# Patient Record
Sex: Male | Born: 1960
Health system: Southern US, Community
[De-identification: ages and names within clinical notes are randomized; demographics above are authoritative.]

## PROBLEM LIST (undated history)

## (undated) DIAGNOSIS — I700 Atherosclerosis of aorta: Secondary | ICD-10-CM

## (undated) DIAGNOSIS — M1990 Unspecified osteoarthritis, unspecified site: Secondary | ICD-10-CM

## (undated) DIAGNOSIS — R0600 Dyspnea, unspecified: Secondary | ICD-10-CM

## (undated) DIAGNOSIS — E785 Hyperlipidemia, unspecified: Secondary | ICD-10-CM

## (undated) DIAGNOSIS — N189 Chronic kidney disease, unspecified: Secondary | ICD-10-CM

## (undated) DIAGNOSIS — I10 Essential (primary) hypertension: Secondary | ICD-10-CM

## (undated) DIAGNOSIS — N289 Disorder of kidney and ureter, unspecified: Secondary | ICD-10-CM

## (undated) DIAGNOSIS — K219 Gastro-esophageal reflux disease without esophagitis: Secondary | ICD-10-CM

---

## 2014-11-26 ENCOUNTER — Other Ambulatory Visit (HOSPITAL_COMMUNITY)
Admission: RE | Admit: 2014-11-26 | Discharge: 2014-11-26 | Disposition: A | Discharge status: Home / Self Care | Payer: BLUE CROSS/BLUE SHIELD | Source: Ambulatory Visit | Attending: Preventative Medicine | Admitting: Preventative Medicine

## 2014-11-26 DIAGNOSIS — E119 Type 2 diabetes mellitus without complications: Secondary | ICD-10-CM | POA: Diagnosis present

## 2014-11-27 LAB — HEMOGLOBIN A1C
Hgb A1c MFr Bld: 5.5 % (ref 4.8–5.6)
Mean Plasma Glucose: 111 mg/dL

## 2016-01-24 ENCOUNTER — Encounter: Payer: Self-pay | Admitting: Emergency Medicine

## 2016-01-24 ENCOUNTER — Emergency Department: Payer: Managed Care, Other (non HMO)

## 2016-01-24 ENCOUNTER — Emergency Department
Admission: EM | Admit: 2016-01-24 | Discharge: 2016-01-24 | Disposition: A | Discharge status: Home / Self Care | Payer: Managed Care, Other (non HMO) | Attending: Emergency Medicine | Admitting: Emergency Medicine

## 2016-01-24 DIAGNOSIS — M76892 Other specified enthesopathies of left lower limb, excluding foot: Secondary | ICD-10-CM | POA: Diagnosis not present

## 2016-01-24 DIAGNOSIS — M25562 Pain in left knee: Secondary | ICD-10-CM | POA: Diagnosis present

## 2016-01-24 DIAGNOSIS — I10 Essential (primary) hypertension: Secondary | ICD-10-CM | POA: Insufficient documentation

## 2016-01-24 DIAGNOSIS — Y999 Unspecified external cause status: Secondary | ICD-10-CM | POA: Insufficient documentation

## 2016-01-24 DIAGNOSIS — Y92410 Unspecified street and highway as the place of occurrence of the external cause: Secondary | ICD-10-CM | POA: Diagnosis not present

## 2016-01-24 DIAGNOSIS — M25462 Effusion, left knee: Secondary | ICD-10-CM | POA: Insufficient documentation

## 2016-01-24 DIAGNOSIS — Y9389 Activity, other specified: Secondary | ICD-10-CM | POA: Diagnosis not present

## 2016-01-24 DIAGNOSIS — Z79899 Other long term (current) drug therapy: Secondary | ICD-10-CM | POA: Diagnosis not present

## 2016-01-24 DIAGNOSIS — F17200 Nicotine dependence, unspecified, uncomplicated: Secondary | ICD-10-CM | POA: Diagnosis not present

## 2016-01-24 HISTORY — DX: Essential (primary) hypertension: I10

## 2016-01-24 MED ORDER — NAPROXEN 500 MG PO TABS: 500.0000 mg | ORAL_TABLET | Freq: Two times a day (BID) | ORAL | Status: DC

## 2016-01-24 MED ORDER — OXYCODONE-ACETAMINOPHEN 5-325 MG PO TABS
1.0000 | ORAL_TABLET | Freq: Once | ORAL | Status: AC
  Administered 2016-01-24: 1 via ORAL
  Filled 2016-01-24: qty 1

## 2016-01-24 MED ORDER — NAPROXEN 500 MG PO TABS
500.0000 mg | ORAL_TABLET | Freq: Once | ORAL | Status: AC
  Administered 2016-01-24: 500 mg via ORAL
  Filled 2016-01-24: qty 1

## 2016-01-24 MED ORDER — TRAMADOL HCL 50 MG PO TABS: 50.0000 mg | ORAL_TABLET | Freq: Four times a day (QID) | ORAL | 0 refills | Status: AC | PRN

## 2016-01-24 NOTE — ED Triage Notes (Signed)
Pt to ed with c/o left knee pain,  Pt denies injury.  Reports swelling and pain worse with standing and unable to bend it.

## 2016-01-24 NOTE — Discharge Instructions (Signed)
Advised over-the-counter elastic knee support.

## 2016-01-24 NOTE — ED Notes (Signed)
See triage note   Developed left knee pain yesterday w/o injury  States pain is at the top of knee  Tender to touch  And positive swelling this am  Unable to bear full wt  Ambulates with limp

## 2016-01-24 NOTE — ED Provider Notes (Signed)
[facility] Emergency Department Provider Note   ____________________________________________   None    (approximate)  I have reviewed the triage vital signs and the nursing notes.   HISTORY  Chief Complaint Knee Pain    HPI Geoffrey Rangel is a 55 y.o. male patient complaining of 2 days of pain and edema to the left knee. Patient denies any provocative incident for this complaint. 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 Patient has applied heat and cold to the area with no relief. Patient rates his pain as a 10 over 10.Patient stated pain increases with standing and is unable to flex the knee without pain. Patient described a pain as sharp.   Past Medical History:  Diagnosis Date  . Hypertension     There are no active problems to display for this patient.   History reviewed. No pertinent surgical history.  Prior to Admission medications   Medication Sig Start Date End Date Taking? Authorizing Provider  amLODipine (NORVASC) 5 MG tablet Take 5 mg by mouth daily.   Yes Historical Provider, MD  lisinopril (PRINIVIL,ZESTRIL) 10 MG tablet Take 10 mg by mouth daily.   Yes Historical Provider, MD  naproxen (NAPROSYN) 500 MG tablet Take 1 tablet (500 mg total) by mouth 2 (two) times daily with a meal. 01/24/16   Sable Feil, PA-C  traMADol (ULTRAM) 50 MG tablet Take 1 tablet (50 mg total) by mouth every 6 (six) hours as needed. 01/24/16 01/23/17  Sable Feil, PA-C    Allergies Review of patient's allergies indicates no known allergies.  History reviewed. No pertinent family history.  Social History Social History  Substance Use Topics  . Smoking status: Current Every Day Smoker  . Smokeless tobacco: Never Used  . Alcohol use No    Review of Systems Constitutional: No fever/chills Eyes: No visual changes. ENT: No sore  throat. Cardiovascular: Denies chest pain. Respiratory: Denies shortness of breath. Gastrointestinal: No abdominal pain.  No nausea, no vomiting.  No diarrhea.  No constipation. Genitourinary: Negative for dysuria. Musculoskeletal: Negative for back pain. Skin: Negative for rash. Neurological: Negative for headaches, focal weakness or numbness. Endocrine:Hypertension   ____________________________________________   PHYSICAL EXAM:  VITAL SIGNS: ED Triage Vitals  Enc Vitals Group     BP 01/24/16 0740 (!) 147/88     Pulse Rate 01/24/16 0740 95     Resp 01/24/16 0740 (!) 96     Temp 01/24/16 0740 98.2 F (36.8 C)     Temp Source 01/24/16 0740 Oral     SpO2 01/24/16 0740 100 %     Weight 01/24/16 0741 194 lb (88 kg)     Height 01/24/16 0741 6' (1.829 m)     Head Circumference --      Peak Flow --      Pain Score 01/24/16 0741 10     Pain Loc --      Pain Edu? --      Excl. in [place]? --     Constitutional: Alert and oriented. Well appearing and in no acute distress. Eyes: Conjunctivae are normal. PERRL. EOMI. Head: Atraumatic. Nose: No congestion/rhinnorhea. Mouth/Throat: Mucous membranes are moist.  Oropharynx non-erythematous. Neck: No stridor.  No cervical spine tenderness to palpation. Hematological/Lymphatic/Immunilogical: No cervical lymphadenopathy. Cardiovascular: Normal rate, regular rhythm. Grossly normal heart sounds.  Good peripheral circulation. Respiratory: Normal respiratory effort.  No  retractions. Lungs CTAB. Gastrointestinal: Soft and nontender. No distention. No abdominal bruits. No CVA tenderness. Musculoskeletal: No obvious deformity to the left knee. Patient has some suprapatellar edema. Patient has moderate guarding palpation Center. aspect of the patella. Patient has decreased range of motion with flexion of the knee.  Neurologic:  Normal speech and language. No gross focal neurologic deficits are appreciated. No gait instability. Skin:  Skin is warm,  dry and intact. No rash noted. Psychiatric: Mood and affect are normal. Speech and behavior are normal.  ____________________________________________   LABS (all labs ordered are listed, but only abnormal results are displayed)  Labs Reviewed - No data to display ____________________________________________  EKG   ____________________________________________  RADIOLOGY  No acute findings. Patient has some mild effusion. Patient has a small Baker's cyst popliteal area of the left leg. ____________________________________________   PROCEDURES  Procedure(s) performed: None  Procedures  Critical Care performed: No  ____________________________________________   INITIAL IMPRESSION / ASSESSMENT AND PLAN / ED COURSE  Pertinent labs & imaging results that were available during my care of the patient were reviewed by me and considered in my medical decision making (see chart for details).  Suprapatellar tendinitis with mild effusion. Discussed x-ray findings with patient. Patient given discharge care instructions. Patient get a prescription for naproxen and tramadol. Patient advised to follow-up with family doctor condition persists.  Clinical Course     ____________________________________________   FINAL CLINICAL IMPRESSION(S) / ED DIAGNOSES  Final diagnoses:  Effusion of left knee  Tendinitis of left knee      NEW MEDICATIONS STARTED DURING THIS VISIT:  New Prescriptions   NAPROXEN (NAPROSYN) 500 MG TABLET    Take 1 tablet (500 mg total) by mouth 2 (two) times daily with a meal.   TRAMADOL (ULTRAM) 50 MG TABLET    Take 1 tablet (50 mg total) by mouth every 6 (six) hours as needed.     Note:  This document was prepared using Dragon voice recognition software and may include unintentional dictation errors.    Sable Feil, PA-C 01/24/16 4627    Rudene Re, MD 01/24/16 1150

## 2016-04-09 ENCOUNTER — Encounter (INDEPENDENT_AMBULATORY_CARE_PROVIDER_SITE_OTHER): Payer: Self-pay | Admitting: Vascular Surgery

## 2016-06-01 ENCOUNTER — Other Ambulatory Visit
Admission: RE | Admit: 2016-06-01 | Discharge: 2016-06-01 | Disposition: A | Discharge status: Home / Self Care | Payer: 59 | Source: Ambulatory Visit | Attending: Nephrology | Admitting: Nephrology

## 2016-06-01 DIAGNOSIS — R634 Abnormal weight loss: Secondary | ICD-10-CM | POA: Diagnosis not present

## 2016-06-01 DIAGNOSIS — N184 Chronic kidney disease, stage 4 (severe): Secondary | ICD-10-CM | POA: Insufficient documentation

## 2016-06-01 DIAGNOSIS — R809 Proteinuria, unspecified: Secondary | ICD-10-CM | POA: Diagnosis not present

## 2016-06-01 DIAGNOSIS — I129 Hypertensive chronic kidney disease with stage 1 through stage 4 chronic kidney disease, or unspecified chronic kidney disease: Secondary | ICD-10-CM | POA: Insufficient documentation

## 2016-06-01 LAB — BASIC METABOLIC PANEL
Anion gap: 11 (ref 5–15)
BUN: 61 mg/dL — ABNORMAL HIGH (ref 6–20)
CHLORIDE: 105 mmol/L (ref 101–111)
CO2: 21 mmol/L — ABNORMAL LOW (ref 22–32)
Calcium: 8.9 mg/dL (ref 8.9–10.3)
Creatinine, Ser: 3.76 mg/dL — ABNORMAL HIGH (ref 0.61–1.24)
GFR calc non Af Amer: 17 mL/min — ABNORMAL LOW (ref >60)
GFR, EST AFRICAN AMERICAN: 20 mL/min — AB (ref >60)
Glucose, Bld: 108 mg/dL — ABNORMAL HIGH (ref 65–99)
POTASSIUM: 4.5 mmol/L (ref 3.5–5.1)
SODIUM: 137 mmol/L (ref 135–145)

## 2016-08-25 ENCOUNTER — Ambulatory Visit: Payer: BLUE CROSS/BLUE SHIELD | Admitting: Gastroenterology

## 2016-09-01 ENCOUNTER — Other Ambulatory Visit
Admission: RE | Admit: 2016-09-01 | Discharge: 2016-09-01 | Disposition: A | Discharge status: Home / Self Care | Payer: 59 | Source: Ambulatory Visit | Attending: Nephrology | Admitting: Nephrology

## 2016-09-01 DIAGNOSIS — N184 Chronic kidney disease, stage 4 (severe): Secondary | ICD-10-CM | POA: Diagnosis present

## 2016-09-01 LAB — RENAL FUNCTION PANEL
ALBUMIN: 4.2 g/dL (ref 3.5–5.0)
ANION GAP: 10 (ref 5–15)
BUN: 75 mg/dL — ABNORMAL HIGH (ref 6–20)
CALCIUM: 9.1 mg/dL (ref 8.9–10.3)
CO2: 20 mmol/L — ABNORMAL LOW (ref 22–32)
Chloride: 108 mmol/L (ref 101–111)
Creatinine, Ser: 3.5 mg/dL — ABNORMAL HIGH (ref 0.61–1.24)
GFR calc Af Amer: 21 mL/min — ABNORMAL LOW (ref >60)
GFR calc non Af Amer: 18 mL/min — ABNORMAL LOW (ref >60)
GLUCOSE: 100 mg/dL — AB (ref 65–99)
PHOSPHORUS: 4.8 mg/dL — AB (ref 2.5–4.6)
Potassium: 5.3 mmol/L — ABNORMAL HIGH (ref 3.5–5.1)
SODIUM: 138 mmol/L (ref 135–145)

## 2016-09-06 ENCOUNTER — Other Ambulatory Visit: Payer: Self-pay | Admitting: Nephrology

## 2016-09-06 DIAGNOSIS — R809 Proteinuria, unspecified: Secondary | ICD-10-CM

## 2016-09-15 ENCOUNTER — Encounter
Admission: RE | Admit: 2016-09-15 | Discharge: 2016-09-15 | Disposition: A | Discharge status: Home / Self Care | Payer: 59 | Source: Ambulatory Visit | Attending: Nephrology | Admitting: Nephrology

## 2016-09-15 DIAGNOSIS — I129 Hypertensive chronic kidney disease with stage 1 through stage 4 chronic kidney disease, or unspecified chronic kidney disease: Secondary | ICD-10-CM | POA: Diagnosis not present

## 2016-09-15 DIAGNOSIS — N184 Chronic kidney disease, stage 4 (severe): Secondary | ICD-10-CM | POA: Diagnosis present

## 2016-09-15 DIAGNOSIS — N2889 Other specified disorders of kidney and ureter: Secondary | ICD-10-CM | POA: Diagnosis not present

## 2016-09-15 DIAGNOSIS — R809 Proteinuria, unspecified: Secondary | ICD-10-CM | POA: Diagnosis not present

## 2016-09-15 DIAGNOSIS — E1122 Type 2 diabetes mellitus with diabetic chronic kidney disease: Secondary | ICD-10-CM | POA: Diagnosis not present

## 2016-09-15 DIAGNOSIS — Z791 Long term (current) use of non-steroidal anti-inflammatories (NSAID): Secondary | ICD-10-CM | POA: Diagnosis not present

## 2016-09-15 LAB — DIFFERENTIAL
BASOS PCT: 1 %
Basophils Absolute: 0.1 10*3/uL (ref 0–0.1)
EOS PCT: 3 %
Eosinophils Absolute: 0.2 10*3/uL (ref 0–0.7)
Lymphocytes Relative: 42 %
Lymphs Abs: 3.7 10*3/uL — ABNORMAL HIGH (ref 1.0–3.6)
MONO ABS: 0.6 10*3/uL (ref 0.2–1.0)
Monocytes Relative: 7 %
Neutro Abs: 4.2 10*3/uL (ref 1.4–6.5)
Neutrophils Relative %: 47 %

## 2016-09-15 LAB — URINALYSIS, ROUTINE W REFLEX MICROSCOPIC
BILIRUBIN URINE: NEGATIVE
Glucose, UA: NEGATIVE mg/dL
Ketones, ur: NEGATIVE mg/dL
Leukocytes, UA: NEGATIVE
Nitrite: NEGATIVE
Protein, ur: 100 mg/dL — AB
SPECIFIC GRAVITY, URINE: 1.015 (ref 1.005–1.030)
pH: 5 (ref 5.0–8.0)

## 2016-09-15 LAB — CBC
HCT: 39.4 % — ABNORMAL LOW (ref 40.0–52.0)
Hemoglobin: 13.4 g/dL (ref 13.0–18.0)
MCH: 29.9 pg (ref 26.0–34.0)
MCHC: 34.1 g/dL (ref 32.0–36.0)
MCV: 87.8 fL (ref 80.0–100.0)
PLATELETS: 223 10*3/uL (ref 150–440)
RBC: 4.49 MIL/uL (ref 4.40–5.90)
RDW: 13.8 % (ref 11.5–14.5)
WBC: 8.8 10*3/uL (ref 3.8–10.6)

## 2016-09-15 LAB — PROTEIN / CREATININE RATIO, URINE
CREATININE, URINE: 78 mg/dL
Protein Creatinine Ratio: 0.78 mg/mg{Cre} — ABNORMAL HIGH (ref 0.00–0.15)
Total Protein, Urine: 61 mg/dL

## 2016-09-15 LAB — COMPREHENSIVE METABOLIC PANEL
ALBUMIN: 4 g/dL (ref 3.5–5.0)
ALK PHOS: 96 U/L (ref 38–126)
ALT: 15 U/L — ABNORMAL LOW (ref 17–63)
AST: 16 U/L (ref 15–41)
Anion gap: 9 (ref 5–15)
BILIRUBIN TOTAL: 0.6 mg/dL (ref 0.3–1.2)
BUN: 76 mg/dL — AB (ref 6–20)
CALCIUM: 9.1 mg/dL (ref 8.9–10.3)
CO2: 17 mmol/L — ABNORMAL LOW (ref 22–32)
Chloride: 109 mmol/L (ref 101–111)
Creatinine, Ser: 3.28 mg/dL — ABNORMAL HIGH (ref 0.61–1.24)
GFR calc Af Amer: 23 mL/min — ABNORMAL LOW (ref >60)
GFR calc non Af Amer: 20 mL/min — ABNORMAL LOW (ref >60)
GLUCOSE: 98 mg/dL (ref 65–99)
Potassium: 4.6 mmol/L (ref 3.5–5.1)
Sodium: 135 mmol/L (ref 135–145)
TOTAL PROTEIN: 7.5 g/dL (ref 6.5–8.1)

## 2016-09-15 LAB — URINALYSIS, MICROSCOPIC (REFLEX)
Bacteria, UA: NONE SEEN
Squamous Epithelial / LPF: NONE SEEN
WBC UA: NONE SEEN WBC/hpf (ref 0–5)

## 2016-09-15 LAB — TYPE AND SCREEN
ABO/RH(D): O POS
Antibody Screen: NEGATIVE

## 2016-09-15 LAB — PROTIME-INR
INR: 1.06
Prothrombin Time: 13.8 seconds (ref 11.4–15.2)

## 2016-09-17 ENCOUNTER — Other Ambulatory Visit: Payer: Self-pay

## 2016-09-17 ENCOUNTER — Ambulatory Visit: Payer: Managed Care, Other (non HMO)

## 2016-09-17 ENCOUNTER — Encounter: Payer: Self-pay | Admitting: *Deleted

## 2016-09-17 ENCOUNTER — Other Ambulatory Visit: Payer: Managed Care, Other (non HMO)

## 2016-09-17 ENCOUNTER — Observation Stay: Payer: 59

## 2016-09-17 ENCOUNTER — Observation Stay
Admission: RE | Admit: 2016-09-17 | Discharge: 2016-09-18 | Disposition: A | Discharge status: Home / Self Care | Payer: 59 | Source: Ambulatory Visit | Attending: Nephrology | Admitting: Nephrology

## 2016-09-17 DIAGNOSIS — I129 Hypertensive chronic kidney disease with stage 1 through stage 4 chronic kidney disease, or unspecified chronic kidney disease: Principal | ICD-10-CM | POA: Insufficient documentation

## 2016-09-17 DIAGNOSIS — E1122 Type 2 diabetes mellitus with diabetic chronic kidney disease: Secondary | ICD-10-CM | POA: Insufficient documentation

## 2016-09-17 DIAGNOSIS — N2889 Other specified disorders of kidney and ureter: Secondary | ICD-10-CM | POA: Insufficient documentation

## 2016-09-17 DIAGNOSIS — R809 Proteinuria, unspecified: Secondary | ICD-10-CM | POA: Diagnosis present

## 2016-09-17 DIAGNOSIS — N184 Chronic kidney disease, stage 4 (severe): Secondary | ICD-10-CM | POA: Insufficient documentation

## 2016-09-17 DIAGNOSIS — Z791 Long term (current) use of non-steroidal anti-inflammatories (NSAID): Secondary | ICD-10-CM | POA: Insufficient documentation

## 2016-09-17 HISTORY — PX: RENAL BIOPSY, PERCUTANEOUS: SUR144

## 2016-09-17 LAB — CBC
HCT: 39.7 % — ABNORMAL LOW (ref 40.0–52.0)
HEMOGLOBIN: 13.5 g/dL (ref 13.0–18.0)
MCH: 30.2 pg (ref 26.0–34.0)
MCHC: 34.1 g/dL (ref 32.0–36.0)
MCV: 88.7 fL (ref 80.0–100.0)
Platelets: 217 10*3/uL (ref 150–440)
RBC: 4.47 MIL/uL (ref 4.40–5.90)
RDW: 13.9 % (ref 11.5–14.5)
WBC: 8.6 10*3/uL (ref 3.8–10.6)

## 2016-09-17 MED ORDER — NICOTINE 21 MG/24HR TD PT24
21.0000 mg | MEDICATED_PATCH | Freq: Every day | TRANSDERMAL | Status: DC
  Administered 2016-09-17: 21 mg via TRANSDERMAL
  Filled 2016-09-17: qty 1

## 2016-09-17 MED ORDER — OXYCODONE HCL 5 MG PO TABS: 5.0000 mg | ORAL_TABLET | Freq: Four times a day (QID) | ORAL | Status: DC | PRN

## 2016-09-17 MED ORDER — SODIUM CHLORIDE 0.9 % IV SOLN
INTRAVENOUS | Status: DC
  Administered 2016-09-17: 09:00:00 via INTRAVENOUS

## 2016-09-17 MED ORDER — LORAZEPAM 0.5 MG PO TABS
0.5000 mg | ORAL_TABLET | Freq: Once | ORAL | Status: AC
  Administered 2016-09-17: 0.5 mg via ORAL
  Filled 2016-09-17: qty 1

## 2016-09-17 MED ORDER — ACETAMINOPHEN 325 MG PO TABS
650.0000 mg | ORAL_TABLET | Freq: Four times a day (QID) | ORAL | Status: DC | PRN
  Administered 2016-09-17: 650 mg via ORAL
  Filled 2016-09-17: qty 2

## 2016-09-17 MED ORDER — ZOLPIDEM TARTRATE 5 MG PO TABS
5.0000 mg | ORAL_TABLET | Freq: Once | ORAL | Status: AC
  Administered 2016-09-17: 5 mg via ORAL
  Filled 2016-09-17: qty 1

## 2016-09-17 NOTE — Progress Notes (Signed)
Dr. Candiss Norse notified of admission. Acknowledged. Gesselle Fitzsimons K, RN7:09 AM 09/17/2016

## 2016-09-17 NOTE — Procedures (Signed)
PROCEDURE: Informed written consent was obtained from the patient after a discussion of the risks, benefits and alternatives to treatment. The patient understands and consents the procedure. A timeout was performed prior to the initiation of the procedure.  Ultrasound scanning was performed of the bilateral flanks. The inferior pole of the LEFT kidney was selected for biopsy due to location and sonographic window. The procedure was planned. The operative site was prepped and draped in the usual sterile fashion. The overlying soft tissues were anesthetized with 10 mL of 1% XYLOCAINE.  A 18 gauge core needle biopsy device was advanced into the inferior cortex of the LEFT kidney and 3 core biopsies were obtained under direct ultrasound guidance. Real time pathologic review confirmed adequate tissue acquisition. Images were saved for documentation purposes. The biopsy device was removed and hemostasis was obtained with manual compression. Post procedural scanning was negative for significant post procedural hemorrhage or additional complication. A dressing was placed. The patient tolerated the procedure well without immediate post procedural complication.  

## 2016-09-17 NOTE — H&P (Signed)
[facility], [place] 09/17/16  Subjective:   Patient is known to our practice from outpatient. He is followed for CKD and proteinuria He is admitted for kidney biopsy Patient denies any acute c/o No fevers, chills, no N/V No leg edema. No SOB   Objective:  Vital signs in last 24 hours:  Temp:  [98.1 F (36.7 C)] 98.1 F (36.7 C) (06/22 0650) Pulse Rate:  [68] 68 (06/22 0650) BP: (131)/(65) 131/65 (06/22 0650) SpO2:  [100 %] 100 % (06/22 0650) Weight:  [77.9 kg (171 lb 11.2 oz)] 77.9 kg (171 lb 11.2 oz) (06/22 0845)  Weight change:  Filed Weights   09/17/16 0845  Weight: 77.9 kg (171 lb 11.2 oz)    Intake/Output:    Intake/Output Summary (Last 24 hours) at 09/17/16 0914 Last data filed at 09/17/16 0700  Gross per 24 hour  Intake                0 ml  Output                0 ml  Net                0 ml     Physical Exam: General: Laying in bed, NAD  HEENT Anicteric, moist oral mucus membranes  Neck supple  Pulm/lungs Clear b/l  CVS/Heart Regular, no rub  Abdomen:  Soft, NT  Extremities: No edema  Neurologic: Alert, oriented  Skin: No acute rashes          Basic Metabolic Panel:   Recent Labs Lab 09/15/16 1332  NA 135  K 4.6  CL 109  CO2 17*  GLUCOSE 98  BUN 76*  CREATININE 3.28*  CALCIUM 9.1     CBC:  Recent Labs Lab 09/15/16 1332  WBC 8.8  NEUTROABS 4.2  HGB 13.4  HCT 39.4*  MCV 87.8  PLT 223     No results found for: HEPBSAG, HEPBSAB, HEPBIGM    Microbiology:  No results found for this or any previous visit (from the past 240 hour(s)).  Coagulation Studies:  Recent Labs  09/15/16 1332  LABPROT 13.8  INR 1.06    Urinalysis:  Recent Labs  09/15/16 1332  COLORURINE YELLOW  LABSPEC 1.015  PHURINE 5.0  GLUCOSEU NEGATIVE  HGBUR TRACE*  BILIRUBINUR NEGATIVE  KETONESUR NEGATIVE  PROTEINUR 100*  NITRITE NEGATIVE  LEUKOCYTESUR NEGATIVE    Results for Geoffrey Rangel, Geoffrey Rangel (MRN  [id number]) as of 09/17/2016 09:22  Ref. Range 09/15/2016 13:32  Protein Creatinine Ratio Latest Ref Range: 0.00 - 0.15 mg/mgCre 0.78 (H)    Imaging: No results found.   Medications:   . sodium chloride 10 mL/hr at 09/17/16 3428      Assessment/ Plan:  56 y.o. caucasian male with Diet controlled DM-2, HTN, h/o kidney stones, chronic NSAID use, CKD, weight loss  1. CKD st 4 with proteinuria Cause is unclear Serological work up is negative Urine P/C ratio outpatient was 1.1 gm Patient is admitted for kidney biopsy. Risks benefits and alternatives were discussed. Patient has agreed to proceed.  Low dose ativan for anxiety  2. Unexplained weight loss - GI appointment is pending  3. HTN Controlled ACE-i   LOS: 0 [facility] 6/22/20189:14 [place]

## 2016-09-17 NOTE — Progress Notes (Signed)
Spoke with Dr. Candiss Norse via phone about patient request for pain medicine and something to help him sleep. New orders given for tylenol, oxycodone, and ambien. Geoffrey Rangel

## 2016-09-18 DIAGNOSIS — I129 Hypertensive chronic kidney disease with stage 1 through stage 4 chronic kidney disease, or unspecified chronic kidney disease: Secondary | ICD-10-CM | POA: Diagnosis not present

## 2016-09-18 LAB — CBC
HCT: 40.5 % (ref 40.0–52.0)
HEMOGLOBIN: 13.6 g/dL (ref 13.0–18.0)
MCH: 29.7 pg (ref 26.0–34.0)
MCHC: 33.5 g/dL (ref 32.0–36.0)
MCV: 88.7 fL (ref 80.0–100.0)
PLATELETS: 206 10*3/uL (ref 150–440)
RBC: 4.56 MIL/uL (ref 4.40–5.90)
RDW: 13.6 % (ref 11.5–14.5)
WBC: 9.4 10*3/uL (ref 3.8–10.6)

## 2016-09-18 MED ORDER — OXYCODONE HCL 5 MG PO TABS: 5.0000 mg | ORAL_TABLET | Freq: Four times a day (QID) | ORAL | 0 refills | Status: DC | PRN

## 2016-09-18 NOTE — Progress Notes (Signed)
Dr. Candiss Norse rounded and ordered discharge. Discharge instructions and prescription was reviewed with patient and his wife.  Questions were encouraged. Per patient, Dr. Candiss Norse discussed instructions for follow up appointment. Patient and wife both verbalized understanding.

## 2016-09-18 NOTE — Discharge Summary (Signed)
[facility], [place] 09/18/16  Subjective:   Doing well after biopsy Some soreness No blood in urine  Objective:  Vital signs in last 24 hours:  Temp:  [97.4 F (36.3 C)-98.4 F (36.9 C)] 97.5 F (36.4 C) (06/23 0418) Pulse Rate:  [54-101] 54 (06/23 0418) Resp:  [16-18] 16 (06/23 0418) BP: (98-132)/(58-89) 106/66 (06/23 0418) SpO2:  [98 %-100 %] 100 % (06/23 0418)  Weight change:  Filed Weights   09/17/16 0845  Weight: 77.9 kg (171 lb 11.2 oz)    Intake/Output:    Intake/Output Summary (Last 24 hours) at 09/18/16 0924 Last data filed at 09/18/16 0700  Gross per 24 hour  Intake              320 ml  Output             1900 ml  Net            -1580 ml     Physical Exam: General: NAD   HEENT Anicteric, moist mucus membranes  Neck supple  Pulm/lungs clear  CVS/Heart regular  Abdomen:  Soft, NT  Extremities: No edema  Neurologic: No edema  Skin: No rashes          Basic Metabolic Panel:   Recent Labs Lab 09/15/16 1332  NA 135  K 4.6  CL 109  CO2 17*  GLUCOSE 98  BUN 76*  CREATININE 3.28*  CALCIUM 9.1     CBC:  Recent Labs Lab 09/15/16 1332 09/17/16 1719 09/18/16 0347  WBC 8.8 8.6 9.4  NEUTROABS 4.2  --   --   HGB 13.4 13.5 13.6  HCT 39.4* 39.7* 40.5  MCV 87.8 88.7 88.7  PLT 223 217 206     No results found for: HEPBSAG, HEPBSAB, HEPBIGM    Microbiology:  No results found for this or any previous visit (from the past 240 hour(s)).  Coagulation Studies:  Recent Labs  09/15/16 1332  LABPROT 13.8  INR 1.06    Urinalysis:  Recent Labs  09/15/16 1332  COLORURINE YELLOW  LABSPEC 1.015  PHURINE 5.0  GLUCOSEU NEGATIVE  HGBUR TRACE*  BILIRUBINUR NEGATIVE  KETONESUR NEGATIVE  PROTEINUR 100*  NITRITE NEGATIVE  LEUKOCYTESUR NEGATIVE      Imaging: US Biopsy-no Radiologist  Result Date: 09/17/2016 CLINICAL DATA:  Percutaneous renal biopsy EXAM: ULTRASOUND GUIDED CORE NEEDLE BIOPSY OF THE  LEFT KIDNEY COMPARISON:  None FINDINGS: Percutaneous ultrasound-guided core biopsy of the left kidney performed by Dr. Candiss Norse without a radiologist present. Multiple sonographic images from the biopsy are provided for interpretation. Right kidney measures 9.5 cm in length. No hydronephrosis or renal mass. Left kidney measures 10.03 cm in length. No hydronephrosis or renal mass. Three core biopsies were obtained through the lower pole of the left kidney. Post biopsy images of the left kidney demonstrate a small avascular fluid collection adjacent to the left lower pole measuring 2 x 1.4 cm likely reflecting a small perinephric hematoma. IMPRESSION: Percutaneous ultrasound-guided core biopsy of the left kidney performed by Dr. Candiss Norse without a radiologist present. Small perinephric hematoma adjacent to the lower pole of the left kidney measuring 2 x 1.4 cm. Electronically Signed   By: Kathreen Devoid   On: 09/17/2016 11:27     Medications:    . nicotine  21 mg Transdermal Daily   acetaminophen, oxyCODONE  Assessment/ Plan:  56 y.o. male with Diet controlled DM-2, HTN, h/o kidney stones, chronic NSAID use, CKD, weight loss  1. CKD  st 4 with proteinuria Cause is unclear Serological work up is negative Urine P/C ratio outpatient was 1.1 gm Patient is admitted for kidney biopsy. Ready for d/c today  2. Unexplained weight loss - GI appointment is pending  3. HTN Controlled ACE-i   LOS: 0 [facility] 6/23/20189:24 [place], [place]

## 2016-09-18 NOTE — Discharge Instructions (Signed)
Proteinuria Proteinuria is when there is too much protein in the urine. Proteins are important for buildingmuscles and bones. Proteins are also needed to fight infections, help the blood clot, and keep body fluids in balance. Proteinuria may be mild and temporary, or it may be an early sign of kidney disease. The kidneys make urine. Healthy kidneys also keep substances like proteins from leaving the blood and ending up in the urine. Proteinuria may be a sign that the kidneys are not working well. What are the causes? Healthy kidneys have filters (glomeruli) that keep proteins out of the urine. Proteinuria may mean that the glomeruli are damaged. The main causes of this type of damage are:  Diabetes.  High blood pressure.  Other causes of kidney damage can also cause proteinuria, such as:  Diseases of the immune system, such as lupus, rheumatoid arthritis, sarcoidosis, and Goodpasture syndrome.  Heart disease or heart failure.  Kidney infection.  Certain cancers, including kidney cancer, lymphoma, leukemia, and multiple myeloma.  Amyloidosis. This is a disease that causes abnormal proteins to build up in body tissues.  Reactions to certain medicines, such as NSAIDs.  Injury (trauma) or poisons (toxins).  High blood pressure that occurs during pregnancy (preeclampsia).  Temporary proteinuria may result from conditions that put stress on the kidneys. These conditions usually do not cause kidney damage. They include:  Fever.  Exposure to cold or heat.  Emotional or physical stress.  Extreme exercise.  Standing for long periods of time.  What increases the risk? This condition is more likely to develop in people who:  Have diabetes.  Have high blood pressure.  Have heart disease or heart failure.  Have an immune disease, cancer, or other disease that affects the kidneys.  Have a family history of kidney disease.  Are 65 or older.  Are overweight.  Are of  African-American, American [place], Hispanic/Latino, or [place] descent.  Are pregnant.  Have an infection.  What are the signs or symptoms? Mild proteinuria may not cause symptoms. As more proteins enter the urine, symptoms of kidney disease may develop, such as:  Foamy urine.  Swelling of the face, abdomen, hands, legs, or feet (edema).  Needing to urinate frequently.  Fatigue.  Difficulty sleeping.  Dry and itchy skin.  Nausea and vomiting.  Muscle cramps.  Shortness of breath.  How is this diagnosed? Your health care provider can diagnose proteinuria with a urine test. You may have this test as part of a routine physical or because you have symptoms of kidney disease or risk factors for kidney disease. You may also have:  Blood tests to measure the level of a certain substance (creatinine) that increases with kidney disease.  Imaging tests of your kidney, such as a CT scan or an ultrasound, to look for signs of kidney damage.  How is this treated? If your proteinuria is mild or temporary, no treatment may be needed. Your health care provider may show you how to monitor the level of protein in your urine at home. Identifying proteinuria early is important so that the cause of the condition can be treated. Treatment depends on the cause of your proteinuria. Treatment may include:  Making diet and lifestyle changes.  Getting blood pressure under control.  Getting blood sugar under control, if you have diabetes.  Managing any other medical conditions you have that affect your kidneys.  Giving birth, if you are pregnant.  Avoiding medicines that damage your kidneys.  Treating kidney disease with medicine  and dialysis, as needed.  Follow these instructions at home:  Check your protein levels at home, if directed by your health care provider.  Follow instructions from your health care provider about eating or drinking restrictions.  Take over-the-counter  and prescription medicines only as told by your health care provider.  Return to your normal activities as told by your health care provider. Ask your health care provider what activities are safe for you.  If you are overweight, ask your health care provider to help you with a diet to get to a healthy weight.  Ask your health care provider to help you with an exercise program.  Keep all follow-up visits as told by your health care provider. This is important. Contact a health care provider if:  You have new symptoms.  Your symptoms get worse or do not improve. Get help right away if:  You have back pain.  You have diarrhea.  You vomit.  You have a fever.  You have a rash. This information is not intended to replace advice given to you by your health care provider. Make sure you discuss any questions you have with your health care provider. Document Released: 05/05/2005 Document Revised: 04/25/2015 Document Reviewed: 02/03/2015 Elsevier Interactive Patient Education  2018 [place].   Percutaneous Kidney Biopsy A kidney biopsy is a procedure to remove small pieces of tissue from a kidney. In a percutaneous biopsy, the tissue is removed using a needle that is inserted through the skin. This procedure is done so that the tissue can be examined under a microscope and checked for disease or infection. Tell a health care provider about:  Any allergies you have.  All medicines you are taking, including vitamins, herbs, eye drops, creams, and over-the-counter medicines.  Any problems you or family members have had with anesthetic medicines.  Any blood disorders you have.  Any surgeries you have had.  Any medical conditions you have.  Whether you are pregnant or may be pregnant. What are the risks? Generally, this is a safe procedure. However, problems may occur, including:  Infection.  Bleeding.  Allergic reactions to medicines.  Damage to other structures or  organs.  Swelling from a collection of clotted blood outside a blood vessel (hematoma).  Blood in the urine (hematuria).  What happens before the procedure?  Follow instructions from your health care provider about eating or drinking restrictions.  Ask your health care provider about: ? Changing or stopping your regular medicines. This is especially important if you are taking diabetes medicines or blood thinners. ? Taking medicines such as aspirin and ibuprofen. These medicines can thin your blood. Do not take these medicines before your procedure if your health care provider instructs you not to.  You may be given antibiotic medicine to help prevent infection.  You will have blood and urine samples taken. This is to make sure that you do not have a condition where you should not have a biopsy.  Plan to have someone take you home from the hospital or clinic.  Ask your health care provider how your biopsy site will be marked or identified. What happens during the procedure?  To lower your risk of infection: ? Your health care team will wash or sanitize their hands. ? Your skin will be washed with soap.  An IV tube will be inserted into one of your veins.  You will be given one or more of the following: ? A medicine to help you relax (sedative). ? A  medicine to numb the area (local anesthetic).  You will lie on your abdomen. A firm pillow will be placed under your body to help push the kidneys closer to the surface of the skin. If you have a transplanted kidney, you will lie on your back.  The health care provider will mark the area where the needle will enter your skin.  An imaging test--such as an ultrasound, X-ray, CT scan, or MRI--will be used to locate the kidney. These images will also help the health care provider to guide the biopsy needle into the kidney.  You will be asked to hold your breath and stay still while the health care provider inserts the needle and removes  the kidney tissue. ? You will need to hold your breath and stay still for 30-45 seconds. ? During the biopsy, you may hear a popping sound from the needle. ? You may also feel some pressure from the area where the needle is being inserted.  The needle may be inserted and removed 3 or 4 times to make sure that enough tissue is taken for testing.  A bandage (dressing) may be placed over the spot where the needle entered your skin (biopsy site). The procedure may vary among health care providers and hospitals. What happens after the procedure?  Your blood pressure, heart rate, breathing rate, and blood oxygen level will be monitored until the medicines you were given have worn off.  You will need to lie on your back for 6-8 hours.  You may have some pain or soreness near the biopsy site.  You may have pink or cloudy urine from small amounts of blood. This is normal.  You may have grogginess or fatigue if you were given a sedative.  Do not drive for 24 hours if you were given a sedative.  It is up to you to get the results of your procedure. Ask your health care provider, or the department performing the procedure, when your results will be ready. This information is not intended to replace advice given to you by your health care provider. Make sure you discuss any questions you have with your health care provider. Document Released: 01/24/2004 Document Revised: 12/26/2015 Document Reviewed: 12/26/2015 Elsevier Interactive Patient Education  [employer].

## 2016-09-23 LAB — SURGICAL PATHOLOGY

## 2016-09-27 ENCOUNTER — Encounter: Payer: Self-pay | Admitting: Nephrology

## 2016-09-28 ENCOUNTER — Ambulatory Visit: Payer: Managed Care, Other (non HMO) | Admitting: Gastroenterology

## 2016-09-28 ENCOUNTER — Other Ambulatory Visit: Payer: Self-pay

## 2016-09-28 ENCOUNTER — Encounter: Payer: Self-pay | Admitting: Gastroenterology

## 2016-09-28 ENCOUNTER — Other Ambulatory Visit
Admission: RE | Admit: 2016-09-28 | Discharge: 2016-09-28 | Disposition: A | Discharge status: Home / Self Care | Payer: 59 | Source: Ambulatory Visit | Attending: Gastroenterology | Admitting: Gastroenterology

## 2016-09-28 ENCOUNTER — Ambulatory Visit (INDEPENDENT_AMBULATORY_CARE_PROVIDER_SITE_OTHER): Payer: 59 | Admitting: Gastroenterology

## 2016-09-28 VITALS — BP 118/75 | Temp 98.4°F | Ht 74.0 in | Wt 182.0 lb

## 2016-09-28 DIAGNOSIS — R197 Diarrhea, unspecified: Secondary | ICD-10-CM

## 2016-09-28 DIAGNOSIS — R634 Abnormal weight loss: Secondary | ICD-10-CM | POA: Diagnosis present

## 2016-09-28 NOTE — Progress Notes (Signed)
Jonathon Bellows MD, MRCP(U.K) [address]  [place]  [place], [place] [address]  Main: [phone]  Fax: [phone]   Gastroenterology Consultation  Referring Provider:     Dr Candiss Norse  Primary Care Physician:  Patient, No Pcp Per Primary Gastroenterologist:  Dr. Jonathon Bellows  Reason for Consultation:     Weight loss and diarrhea         HPI:   Geoffrey Rangel is a 56 y.o. y/o male referred for weight loss and diarrhea .   He has been referred for weight loss,severe diarrhea . Labs 08/2016 Hb 13.6 . Albumin 4.0 .  He says that he weighed 300 lbs 1 year back and today weighs 182 lbs. " I eat like a pig".   Diarrhea :  Onset: 1 year, not progressed    Number of bowel movements a day : 1 or none    Color : brown   Consistency:  Mashed potatoes , sometimes large in qty and sometimes small Present status: ongoing    Shape of stool:  Yes    Weight loss:  Yes  Prior colonoscopy:  No   Artificial sugars/sodas/chewing gum:  No    Bloating:  No   Gas:  Yes  Antibiotic use: no   Denies any rectal bleeding , no shortness of breath.     Past Medical History:  Diagnosis Date  . Hypertension     Past Surgical History:  Procedure Laterality Date  . RENAL BIOPSY, PERCUTANEOUS  09/17/2016        Prior to Admission medications   Medication Sig Start Date End Date Taking? Authorizing Provider  amLODipine (NORVASC) 5 MG tablet Take 5 mg by mouth daily.    [provider]  lisinopril (PRINIVIL,ZESTRIL) 10 MG tablet Take 10 mg by mouth daily.    [provider]  oxyCODONE (OXY IR/ROXICODONE) 5 MG immediate release tablet Take 1 tablet (5 mg total) by mouth every 6 (six) hours as needed for severe pain. 09/18/16   Murlean Iba, MD  traMADol (ULTRAM) 50 MG tablet Take 1 tablet (50 mg total) by mouth every 6 (six) hours as needed. 01/24/16 01/23/17  Sable Feil, PA-C    No family history on file.   Social History  Substance Use Topics  . Smoking  status: Current Every Day Smoker  . Smokeless tobacco: Never Used  . Alcohol use No    Allergies as of 09/28/2016  . (No Known Allergies)    Review of Systems:    All systems reviewed and negative except where noted in HPI.   Physical Exam:  There were no vitals taken for this visit. No LMP for male patient. Psych:  Alert and cooperative. Normal mood and affect. General:   Alert,  Well-developed, well-nourished, pleasant and cooperative in NAD Head:  Normocephalic and atraumatic. Eyes:  Sclera clear, no icterus.   Conjunctiva pink. Ears:  Normal auditory acuity. Nose:  No deformity, discharge, or lesions. Mouth:  No deformity or lesions,oropharynx pink & moist. Neck:  Supple; no masses or thyromegaly. Lungs:  Respirations even and unlabored.  Clear throughout to auscultation.   No wheezes, crackles, or rhonchi. No acute distress. Heart:  Regular rate and rhythm; no murmurs, clicks, rubs, or gallops. Abdomen:  Normal bowel sounds.  No bruits.  Soft, non-tender and non-distended without masses, hepatosplenomegaly or hernias noted.  No guarding or rebound tenderness.   Lot of loose skin on abdomen  Neurologic:  Alert and oriented x3;  grossly normal neurologically. Skin:  Intact without significant lesions or rashes. No jaundice. Lymph Nodes:  No significant cervical adenopathy. Psych:  Alert and cooperative. Normal mood and affect.  Imaging Studies: US Biopsy-no Radiologist  Result Date: 09/17/2016 CLINICAL DATA:  Percutaneous renal biopsy EXAM: ULTRASOUND GUIDED CORE NEEDLE BIOPSY OF THE LEFT KIDNEY COMPARISON:  None FINDINGS: Percutaneous ultrasound-guided core biopsy of the left kidney performed by Dr. Candiss Norse without a radiologist present. Multiple sonographic images from the biopsy are provided for interpretation. Right kidney measures 9.5 cm in length. No hydronephrosis or renal mass. Left kidney measures 10.03 cm in length. No hydronephrosis or renal mass. Three core biopsies  were obtained through the lower pole of the left kidney. Post biopsy images of the left kidney demonstrate a small avascular fluid collection adjacent to the left lower pole measuring 2 x 1.4 cm likely reflecting a small perinephric hematoma. IMPRESSION: Percutaneous ultrasound-guided core biopsy of the left kidney performed by Dr. Candiss Norse without a radiologist present. Small perinephric hematoma adjacent to the lower pole of the left kidney measuring 2 x 1.4 cm. Electronically Signed   By: Kathreen Devoid   On: 09/17/2016 11:27    Assessment and Plan:   Geoffrey Rangel is a 56 y.o. y/o male has been referred for Weight loss-unintentional(182 lbs weight loss last few months ), diarrhea    Plan  1. Check TSH 2. EGD+colonoscopy  3.  CT chest/abdomen/pelvis. 4. Stool for c diff, pcr, lactoferrin and celiac serology  5. Needs to stop smoking .    I have discussed alternative options, risks & benefits,  which include, but are not limited to, bleeding, infection, perforation,respiratory complication & drug reaction.  The patient agrees with this plan & written consent will be obtained.     Follow up in 8-12 weeks  Dr Jonathon Bellows MD,MRCP(U.K)

## 2016-09-28 NOTE — Patient Instructions (Signed)
You have been scheduled CT of Chest, Abd and Pelvis at [facility] on Monday Julyn 9th arrive at 10:45am.  You have been scheduled for Colonoscopy and Endoscopy at [facility] on 10/13/16 (Wed) Dr. Vicente Males.

## 2016-09-29 ENCOUNTER — Other Ambulatory Visit
Admission: RE | Admit: 2016-09-29 | Discharge: 2016-09-29 | Disposition: A | Discharge status: Home / Self Care | Payer: 59 | Source: Ambulatory Visit | Attending: Gastroenterology | Admitting: Gastroenterology

## 2016-09-29 DIAGNOSIS — R197 Diarrhea, unspecified: Secondary | ICD-10-CM | POA: Insufficient documentation

## 2016-09-29 LAB — GASTROINTESTINAL PANEL BY PCR, STOOL (REPLACES STOOL CULTURE)

## 2016-09-29 LAB — C DIFFICILE QUICK SCREEN W PCR REFLEX
C DIFFICILE (CDIFF) INTERP: NOT DETECTED
C DIFFICILE (CDIFF) TOXIN: NEGATIVE
C Diff antigen: NEGATIVE

## 2016-09-29 LAB — LACTOFERRIN, FECAL, QUALITATIVE: Lactoferrin, Fecal, Qual: NEGATIVE

## 2016-09-30 ENCOUNTER — Other Ambulatory Visit: Payer: Self-pay

## 2016-09-30 DIAGNOSIS — R634 Abnormal weight loss: Secondary | ICD-10-CM

## 2016-10-01 ENCOUNTER — Other Ambulatory Visit: Payer: Self-pay

## 2016-10-01 DIAGNOSIS — R197 Diarrhea, unspecified: Secondary | ICD-10-CM

## 2016-10-01 DIAGNOSIS — R634 Abnormal weight loss: Secondary | ICD-10-CM

## 2016-10-01 LAB — CELIAC DISEASE PANEL
Endomysial Ab, IgA: NEGATIVE
IgA: 200 mg/dL (ref 90–386)
Tissue Transglutaminase Ab, IgA: <2 U/mL (ref 0–3)

## 2016-10-04 ENCOUNTER — Ambulatory Visit
Admission: RE | Admit: 2016-10-04 | Discharge: 2016-10-04 | Disposition: A | Discharge status: Home / Self Care | Payer: Commercial Managed Care - HMO | Source: Ambulatory Visit | Attending: Gastroenterology | Admitting: Gastroenterology

## 2016-10-04 ENCOUNTER — Telehealth: Payer: Self-pay

## 2016-10-04 DIAGNOSIS — R197 Diarrhea, unspecified: Secondary | ICD-10-CM | POA: Diagnosis present

## 2016-10-04 DIAGNOSIS — R634 Abnormal weight loss: Secondary | ICD-10-CM | POA: Diagnosis present

## 2016-10-04 DIAGNOSIS — I700 Atherosclerosis of aorta: Secondary | ICD-10-CM | POA: Insufficient documentation

## 2016-10-04 DIAGNOSIS — K5730 Diverticulosis of large intestine without perforation or abscess without bleeding: Secondary | ICD-10-CM | POA: Diagnosis not present

## 2016-10-04 DIAGNOSIS — I2510 Atherosclerotic heart disease of native coronary artery without angina pectoris: Secondary | ICD-10-CM | POA: Insufficient documentation

## 2016-10-04 NOTE — Telephone Encounter (Signed)
Advised patient of results per Dr. Vicente Males.   Stool tests negative  Celiac serology negative

## 2016-10-04 NOTE — Telephone Encounter (Signed)
-----   Message from Jonathon Bellows, MD sent at 09/30/2016  1:36 PM EDT ----- Stool tests negative

## 2016-10-05 ENCOUNTER — Telehealth: Payer: Self-pay

## 2016-10-05 ENCOUNTER — Telehealth: Payer: Self-pay | Admitting: Gastroenterology

## 2016-10-05 NOTE — Telephone Encounter (Signed)
Patient left a voice message that he is returning your call

## 2016-10-05 NOTE — Telephone Encounter (Signed)
-----   Message from Jonathon Bellows, MD sent at 10/04/2016 12:59 PM EDT ----- Dahlia Byes inform normal CT chest/abdomen/pelvis- no abnormality to explain weight loss

## 2016-10-05 NOTE — Telephone Encounter (Signed)
Left message with family member for callback for results per Dr. Vicente Males.   Geoffrey Rangel inform normal CT chest/abdomen/pelvis- no abnormality to explain weight loss

## 2016-10-07 ENCOUNTER — Telehealth: Payer: Self-pay | Admitting: Gastroenterology

## 2016-10-07 NOTE — Telephone Encounter (Signed)
10/07/16 [employer] website for EGD 43235 & Colonoscopy 45378 - R63.7 & R19.7 Auth #: [id number].

## 2016-10-08 ENCOUNTER — Encounter: Payer: Self-pay | Admitting: Anesthesiology

## 2016-10-13 ENCOUNTER — Ambulatory Visit: Admission: RE | Admit: 2016-10-13 | Payer: 59 | Source: Ambulatory Visit | Admitting: Gastroenterology

## 2016-10-13 HISTORY — DX: Chronic kidney disease, unspecified: N18.9

## 2016-10-13 HISTORY — DX: Dyspnea, unspecified: R06.00

## 2016-10-13 HISTORY — DX: Unspecified osteoarthritis, unspecified site: M19.90

## 2016-10-13 HISTORY — DX: Gastro-esophageal reflux disease without esophagitis: K21.9

## 2016-10-13 SURGERY — COLONOSCOPY WITH PROPOFOL
Anesthesia: Choice

## 2016-12-29 ENCOUNTER — Ambulatory Visit: Payer: 59 | Admitting: Gastroenterology

## 2016-12-30 ENCOUNTER — Encounter: Payer: Self-pay | Admitting: Gastroenterology

## 2016-12-30 ENCOUNTER — Ambulatory Visit: Payer: 59 | Admitting: Gastroenterology

## 2016-12-30 NOTE — Progress Notes (Deleted)
He is here today for a follow up visit for weight loss and diarrhea.   Summary of history :  He was initially seen on 09/28/16 for weight loss and diarrhea. Labs 08/2016 Hb 13.6 . Albumin 4.0 .He says that he weighed 300 lbs 1 year back and dropped to  182 lbs. Good appetite. Diarrhea began a year back , not progressed, consistency of mashed potatoes .    Interval history   09/28/2016-  12/30/2016  10/04/16- CT chest/abdomen and pelvis:No gross abnormalities to explain weight loss.  Labs 09/2016 - Stool for c diff, lactoferrin, PCR was negative. Celiac serology negative.    Geoffrey Rangel is a 56 y.o. y/o male has been referred for Weight loss-unintentional(182 lbs weight loss last few months ), diarrhea    Plan  1.  EGD+colonoscopy  2. Needs to stop smoking .

## 2018-06-08 ENCOUNTER — Observation Stay (HOSPITAL_COMMUNITY)
Admission: EM | Admit: 2018-06-08 | Discharge: 2018-06-10 | Disposition: A | Discharge status: Home / Self Care | Payer: 59 | Attending: Student in an Organized Health Care Education/Training Program | Admitting: Student in an Organized Health Care Education/Training Program

## 2018-06-08 ENCOUNTER — Inpatient Hospital Stay (HOSPITAL_COMMUNITY): Payer: 59

## 2018-06-08 ENCOUNTER — Other Ambulatory Visit (HOSPITAL_COMMUNITY): Payer: Self-pay

## 2018-06-08 ENCOUNTER — Other Ambulatory Visit: Payer: Self-pay

## 2018-06-08 ENCOUNTER — Encounter (HOSPITAL_COMMUNITY): Payer: Self-pay | Admitting: Emergency Medicine

## 2018-06-08 ENCOUNTER — Emergency Department (HOSPITAL_COMMUNITY): Payer: 59

## 2018-06-08 ENCOUNTER — Encounter (HOSPITAL_COMMUNITY)
Admission: EM | Disposition: A | Discharge status: Home / Self Care | Payer: Self-pay | Source: Home / Self Care | Attending: Emergency Medicine

## 2018-06-08 DIAGNOSIS — Z9114 Patient's other noncompliance with medication regimen: Secondary | ICD-10-CM | POA: Insufficient documentation

## 2018-06-08 DIAGNOSIS — R079 Chest pain, unspecified: Secondary | ICD-10-CM | POA: Diagnosis not present

## 2018-06-08 DIAGNOSIS — I129 Hypertensive chronic kidney disease with stage 1 through stage 4 chronic kidney disease, or unspecified chronic kidney disease: Secondary | ICD-10-CM | POA: Diagnosis not present

## 2018-06-08 DIAGNOSIS — E1122 Type 2 diabetes mellitus with diabetic chronic kidney disease: Secondary | ICD-10-CM | POA: Insufficient documentation

## 2018-06-08 DIAGNOSIS — I120 Hypertensive chronic kidney disease with stage 5 chronic kidney disease or end stage renal disease: Secondary | ICD-10-CM | POA: Diagnosis not present

## 2018-06-08 DIAGNOSIS — Z8249 Family history of ischemic heart disease and other diseases of the circulatory system: Secondary | ICD-10-CM | POA: Insufficient documentation

## 2018-06-08 DIAGNOSIS — N184 Chronic kidney disease, stage 4 (severe): Secondary | ICD-10-CM

## 2018-06-08 DIAGNOSIS — N189 Chronic kidney disease, unspecified: Secondary | ICD-10-CM

## 2018-06-08 DIAGNOSIS — N400 Enlarged prostate without lower urinary tract symptoms: Secondary | ICD-10-CM | POA: Insufficient documentation

## 2018-06-08 DIAGNOSIS — N185 Chronic kidney disease, stage 5: Secondary | ICD-10-CM | POA: Diagnosis present

## 2018-06-08 DIAGNOSIS — E872 Acidosis: Secondary | ICD-10-CM | POA: Insufficient documentation

## 2018-06-08 DIAGNOSIS — I200 Unstable angina: Secondary | ICD-10-CM | POA: Insufficient documentation

## 2018-06-08 DIAGNOSIS — E785 Hyperlipidemia, unspecified: Secondary | ICD-10-CM | POA: Diagnosis not present

## 2018-06-08 DIAGNOSIS — Z8639 Personal history of other endocrine, nutritional and metabolic disease: Secondary | ICD-10-CM

## 2018-06-08 DIAGNOSIS — F17210 Nicotine dependence, cigarettes, uncomplicated: Secondary | ICD-10-CM | POA: Diagnosis not present

## 2018-06-08 DIAGNOSIS — E875 Hyperkalemia: Secondary | ICD-10-CM | POA: Insufficient documentation

## 2018-06-08 DIAGNOSIS — I213 ST elevation (STEMI) myocardial infarction of unspecified site: Secondary | ICD-10-CM

## 2018-06-08 HISTORY — DX: Disorder of kidney and ureter, unspecified: N28.9

## 2018-06-08 LAB — PROTEIN / CREATININE RATIO, URINE
Creatinine, Urine: 54.74 mg/dL
Protein Creatinine Ratio: 2.06 mg/mg{Cre} — ABNORMAL HIGH (ref 0.00–0.15)
Total Protein, Urine: 113 mg/dL

## 2018-06-08 LAB — LIPID PANEL
Cholesterol: 197 mg/dL (ref 0–200)
HDL: 33 mg/dL — ABNORMAL LOW (ref >40)
LDL Cholesterol: 134 mg/dL — ABNORMAL HIGH (ref 0–99)
Total CHOL/HDL Ratio: 6 RATIO
Triglycerides: 149 mg/dL (ref <150)
VLDL: 30 mg/dL (ref 0–40)

## 2018-06-08 LAB — CBC WITH DIFFERENTIAL/PLATELET
Abs Immature Granulocytes: 0.03 10*3/uL (ref 0.00–0.07)
Basophils Absolute: 0.1 10*3/uL (ref 0.0–0.1)
Basophils Relative: 1 %
EOS PCT: 2 %
Eosinophils Absolute: 0.1 10*3/uL (ref 0.0–0.5)
HCT: 41.6 % (ref 39.0–52.0)
Hemoglobin: 13.1 g/dL (ref 13.0–17.0)
Immature Granulocytes: 0 %
Lymphocytes Relative: 22 %
Lymphs Abs: 1.8 10*3/uL (ref 0.7–4.0)
MCH: 29.4 pg (ref 26.0–34.0)
MCHC: 31.5 g/dL (ref 30.0–36.0)
MCV: 93.3 fL (ref 80.0–100.0)
Monocytes Absolute: 0.6 10*3/uL (ref 0.1–1.0)
Monocytes Relative: 8 %
Neutro Abs: 5.5 10*3/uL (ref 1.7–7.7)
Neutrophils Relative %: 67 %
PLATELETS: 203 10*3/uL (ref 150–400)
RBC: 4.46 MIL/uL (ref 4.22–5.81)
RDW: 13.8 % (ref 11.5–15.5)
WBC: 8.1 10*3/uL (ref 4.0–10.5)
nRBC: 0 % (ref 0.0–0.2)

## 2018-06-08 LAB — TROPONIN I
Troponin I: <0.03 ng/mL (ref <0.03)
Troponin I: <0.03 ng/mL (ref <0.03)

## 2018-06-08 LAB — URINALYSIS, ROUTINE W REFLEX MICROSCOPIC
Bilirubin Urine: NEGATIVE
Glucose, UA: NEGATIVE mg/dL
Ketones, ur: NEGATIVE mg/dL
Leukocytes,Ua: NEGATIVE
Nitrite: NEGATIVE
PH: 6 (ref 5.0–8.0)
Protein, ur: 100 mg/dL — AB
Specific Gravity, Urine: 1.01 (ref 1.005–1.030)

## 2018-06-08 LAB — COMPREHENSIVE METABOLIC PANEL
ALT: 14 U/L (ref 0–44)
AST: 17 U/L (ref 15–41)
Albumin: 3.9 g/dL (ref 3.5–5.0)
Alkaline Phosphatase: 69 U/L (ref 38–126)
Anion gap: 11 (ref 5–15)
BUN: 71 mg/dL — ABNORMAL HIGH (ref 6–20)
CO2: 15 mmol/L — ABNORMAL LOW (ref 22–32)
Calcium: 9.1 mg/dL (ref 8.9–10.3)
Chloride: 112 mmol/L — ABNORMAL HIGH (ref 98–111)
Creatinine, Ser: 5.73 mg/dL — ABNORMAL HIGH (ref 0.61–1.24)
GFR calc non Af Amer: 10 mL/min — ABNORMAL LOW (ref >60)
GFR, EST AFRICAN AMERICAN: 12 mL/min — AB (ref >60)
Glucose, Bld: 103 mg/dL — ABNORMAL HIGH (ref 70–99)
Potassium: 5.4 mmol/L — ABNORMAL HIGH (ref 3.5–5.1)
Sodium: 138 mmol/L (ref 135–145)
TOTAL PROTEIN: 7 g/dL (ref 6.5–8.1)
Total Bilirubin: 0.7 mg/dL (ref 0.3–1.2)

## 2018-06-08 LAB — APTT: aPTT: 30 seconds (ref 24–36)

## 2018-06-08 LAB — PROTIME-INR
INR: 1.1 (ref 0.8–1.2)
Prothrombin Time: 14 seconds (ref 11.4–15.2)

## 2018-06-08 LAB — I-STAT TROPONIN, ED
TROPONIN I, POC: 0.01 ng/mL (ref 0.00–0.08)
Troponin i, poc: 0.01 ng/mL (ref 0.00–0.08)

## 2018-06-08 LAB — HEPARIN LEVEL (UNFRACTIONATED): Heparin Unfractionated: 0.26 IU/mL — ABNORMAL LOW (ref 0.30–0.70)

## 2018-06-08 LAB — PHOSPHORUS: Phosphorus: 5.6 mg/dL — ABNORMAL HIGH (ref 2.5–4.6)

## 2018-06-08 SURGERY — CORONARY/GRAFT ACUTE MI REVASCULARIZATION
Anesthesia: LOCAL

## 2018-06-08 MED ORDER — ASPIRIN 81 MG PO CHEW: 324.0000 mg | CHEWABLE_TABLET | Freq: Once | ORAL | Status: DC

## 2018-06-08 MED ORDER — SODIUM CHLORIDE 0.9 % IV SOLN
INTRAVENOUS | Status: DC
  Administered 2018-06-08 (×2): via INTRAVENOUS

## 2018-06-08 MED ORDER — HEPARIN BOLUS VIA INFUSION
4000.0000 [IU] | Freq: Once | INTRAVENOUS | Status: AC
  Administered 2018-06-08: 4000 [IU] via INTRAVENOUS
  Filled 2018-06-08: qty 4000

## 2018-06-08 MED ORDER — ACETAMINOPHEN 325 MG PO TABS
650.0000 mg | ORAL_TABLET | Freq: Once | ORAL | Status: AC
  Administered 2018-06-08: 650 mg via ORAL
  Filled 2018-06-08: qty 2

## 2018-06-08 MED ORDER — ACETAMINOPHEN 325 MG PO TABS: 650.0000 mg | ORAL_TABLET | Freq: Four times a day (QID) | ORAL | Status: DC | PRN

## 2018-06-08 MED ORDER — HEPARIN (PORCINE) 25000 UT/250ML-% IV SOLN
1450.0000 [IU]/h | INTRAVENOUS | Status: DC
  Administered 2018-06-08: 1050 [IU]/h via INTRAVENOUS
  Administered 2018-06-09: 1300 [IU]/h via INTRAVENOUS
  Filled 2018-06-08 (×2): qty 250

## 2018-06-08 MED ORDER — SODIUM ZIRCONIUM CYCLOSILICATE 5 G PO PACK
5.0000 g | PACK | Freq: Once | ORAL | Status: AC
  Administered 2018-06-08: 5 g via ORAL
  Filled 2018-06-08: qty 1

## 2018-06-08 MED ORDER — NITROGLYCERIN IN D5W 200-5 MCG/ML-% IV SOLN
0.0000 ug/min | INTRAVENOUS | Status: DC
  Administered 2018-06-08: 5 ug/min via INTRAVENOUS
  Filled 2018-06-08: qty 250

## 2018-06-08 MED ORDER — ACETAMINOPHEN 650 MG RE SUPP: 650.0000 mg | Freq: Four times a day (QID) | RECTAL | Status: DC | PRN

## 2018-06-08 MED ORDER — NICOTINE 21 MG/24HR TD PT24
21.0000 mg | MEDICATED_PATCH | Freq: Every day | TRANSDERMAL | Status: DC
  Administered 2018-06-08 – 2018-06-10 (×3): 21 mg via TRANSDERMAL
  Filled 2018-06-08 (×3): qty 1

## 2018-06-08 NOTE — ED Notes (Signed)
Chest pain 0/10 at this time.

## 2018-06-08 NOTE — ED Provider Notes (Addendum)
[facility] EMERGENCY DEPARTMENT Provider Note   CSN: [id number] Arrival date & time: 06/08/18  1015    History   Chief Complaint Chief Complaint  Patient presents with  . Chest Pain    HPI Geoffrey Rangel is a 58 y.o. male.     Patient with acute onset of central anterior chest pain radiating to the back at 845 this morning.  Patient had been up and about was feeling fine this hit him suddenly.  Never anything like this before.  Patient not very compliant with following up with doctors.  Several years ago he was told that he needed peritoneal dialysis but never followed up.  He says he only has 1 kidney working.  Has a history of hypertension but not treated for that.  EMS gave him aspirin and nitroglycerin pain came down to about a 5 out of 10 from a 10 out of 10.  No diaphoresis did have some dry heaves.  No abdominal pain.  No syncope.     Past Medical History:  Diagnosis Date  . Renal disorder     There are no active problems to display for this patient.   History reviewed. No pertinent surgical history.      Home Medications    Prior to Admission medications   Not on File    Family History No family history on file.  Social History Social History   Tobacco Use  . Smoking status: Current Every Day Smoker  Substance Use Topics  . Alcohol use: Never    Frequency: Never  . Drug use: Never     Allergies   Patient has no known allergies.   Review of Systems Review of Systems  Constitutional: Negative for chills and fever.  HENT: Negative for rhinorrhea and sore throat.   Eyes: Negative for visual disturbance.  Respiratory: Negative for cough and shortness of breath.   Cardiovascular: Positive for chest pain. Negative for leg swelling.  Gastrointestinal: Positive for nausea. Negative for abdominal pain, diarrhea and vomiting.  Genitourinary: Negative for dysuria.  Musculoskeletal: Positive for back pain. Negative for neck pain.   Skin: Negative for rash.  Neurological: Negative for dizziness, light-headedness and headaches.  Hematological: Does not bruise/bleed easily.  Psychiatric/Behavioral: Negative for confusion.     Physical Exam Updated Vital Signs BP (!) 162/90   Pulse 64   Temp 98.7 F (37.1 C) (Oral)   Resp 20   Ht 1.854 m (6\' 1" )   Wt 85.3 kg   SpO2 100%   BMI 24.80 kg/m   Physical Exam Vitals signs and nursing note reviewed.  Constitutional:      General: He is in acute distress.     Appearance: Normal appearance. He is well-developed.  HENT:     Head: Normocephalic and atraumatic.     Mouth/Throat:     Mouth: Mucous membranes are moist.  Eyes:     Extraocular Movements: Extraocular movements intact.     Conjunctiva/sclera: Conjunctivae normal.     Pupils: Pupils are equal, round, and reactive to light.  Neck:     Musculoskeletal: Normal range of motion and neck supple.  Cardiovascular:     Rate and Rhythm: Normal rate and regular rhythm.     Heart sounds: Normal heart sounds. No murmur.  Pulmonary:     Effort: Pulmonary effort is normal. No respiratory distress.     Breath sounds: Normal breath sounds.  Abdominal:     General: Bowel sounds are normal.  Palpations: Abdomen is soft.     Tenderness: There is no abdominal tenderness.  Musculoskeletal:        General: No swelling.  Skin:    General: Skin is warm and dry.     Capillary Refill: Capillary refill takes less than 2 seconds.  Neurological:     General: No focal deficit present.     Mental Status: He is alert and oriented to person, place, and time.      ED Treatments / Results  Labs (all labs ordered are listed, but only abnormal results are displayed) Labs Reviewed  CBC WITH DIFFERENTIAL/PLATELET  PROTIME-INR  APTT  COMPREHENSIVE METABOLIC PANEL  TROPONIN I  HEPARIN LEVEL (UNFRACTIONATED)  LIPID PANEL    EKG EKG Interpretation  Date/Time:  Thursday June 08 2018 10:23:34 EDT Ventricular Rate:  66  PR Interval:    QRS Duration: 88 QT Interval:  411 QTC Calculation: 431 R Axis:   78 Text Interpretation:  Sinus rhythm Low voltage, precordial leads ST elevation, consider inferior injury Confirmed by Fredia Sorrow [phone]) on 06/08/2018 10:51:29 AM    ED ECG REPORT   Date: 06/08/2018  Rate: 66  Rhythm: normal sinus rhythm  QRS Axis: normal  Intervals: normal  ST/T Wave abnormalities: ST elevations inferiorly  Conduction Disutrbances:none  Narrative Interpretation:   Old EKG Reviewed: none available  I have personally reviewed the EKG tracing and agree with the computerized printout as noted.   Radiology Dg Chest Port 1 View  Result Date: 06/08/2018 CLINICAL DATA:  Chest pain and shortness of breath EXAM: PORTABLE CHEST 1 VIEW COMPARISON:  None. FINDINGS: The heart size and mediastinal contours are within normal limits. Both lungs are clear. The visualized skeletal structures are unremarkable. IMPRESSION: No active disease. Electronically Signed   By: Inez Catalina M.D.   On: 06/08/2018 10:44    Procedures Procedures (including critical care time)  CRITICAL CARE Performed by: Fredia Sorrow Total critical care time: 30 minutes Critical care time was exclusive of separately billable procedures and treating other patients. Critical care was necessary to treat or prevent imminent or life-threatening deterioration. Critical care was time spent personally by me on the following activities: development of treatment plan with patient and/or surrogate as well as nursing, discussions with consultants, evaluation of patient's response to treatment, examination of patient, obtaining history from patient or surrogate, ordering and performing treatments and interventions, ordering and review of laboratory studies, ordering and review of radiographic studies, pulse oximetry and re-evaluation of patient's condition.   Medications Ordered in ED Medications  0.9 %  sodium chloride  infusion (has no administration in time range)  aspirin chewable tablet 324 mg (324 mg Oral Not Given 06/08/18 1036)  heparin ADULT infusion 100 units/mL (25000 units/261mL sodium chloride 0.45%) (1,050 Units/hr Intravenous New Bag/Given 06/08/18 1042)  heparin bolus via infusion 4,000 Units (4,000 Units Intravenous Bolus from Bag 06/08/18 1042)     Initial Impression / Assessment and Plan / ED Course  I have reviewed the triage vital signs and the nursing notes.  Pertinent labs & imaging results that were available during my care of the patient were reviewed by me and considered in my medical decision making (see chart for details).       Patient's presenting EKG not from EMS but the one that we did here had subtle ST segment elevation inferiorly.  Based on his story very good for cardiac chest pain also somewhat concerning though for possible dissection.  Cardiology was called STEMI was  called.  This was occurred during a epic downtime so computers were not readily available.  Dr. Angelena Form from cardiology came and saw the patient agreed that the EKG was concerning would have taken him to the Cath Lab but due to his renal function opted to get labs since pain was improving.  Currently has a right now at 1045 pain very minimal if not gone.  Labs are pending.  Chest x-ray reviewed by me official results still pending without any acute abnormalities.  Patient does have peaked T waves on his EKG but does not know widening of the QRS complex.  We have no idea where his kidney functions at but is probably not good since's couple years ago he was recommended to start peritoneal dialysis and patient never did.  Patient may have to be taken to the Cath Lab either way but Rest[facility] be up to cardiology they plan on admitting.  Pharmacy will be ordering heparin drip for him.  Overall not able to follow a true STEMI approach timewise part of it due to epic being down part of it being due to this concern about  his renal failure and the decision appropriately not to take him immediately to the Cath Lab. Final Clinical Impressions(s) / ED Diagnoses   Final diagnoses:  ST elevation myocardial infarction (STEMI), unspecified artery [facility])    ED Discharge Orders    None       Fredia Sorrow, MD 06/08/18 1047    Fredia Sorrow, MD 06/08/18 1050    Fredia Sorrow, MD 06/08/18 1052

## 2018-06-08 NOTE — ED Triage Notes (Signed)
Onset today driving to work developed chest pain radiating to back. 8/10 pressure. Work gave aspirin 325 mg and EMS gave 1 nitro. Pain decreased to 5/10 achy pressure. Alert answering and following commands appropriate.

## 2018-06-08 NOTE — ED Notes (Signed)
Verified Heparin with Percell Locus RN

## 2018-06-08 NOTE — ED Notes (Signed)
Doctor at bedside.

## 2018-06-08 NOTE — ED Notes (Signed)
Cassie, ([facility] called/activated) @ 1015-per Dr. Jaynie Crumble by Levada Dy

## 2018-06-08 NOTE — Progress Notes (Signed)
ANTICOAGULATION CONSULT NOTE - Follow Up Consult  Pharmacy Consult for heparin Indication: chest pain/ACS  Patient Measurements: Height: 6\' 1"  (185.4 cm) Weight: 174 lb 11.2 oz (79.2 kg) IBW/kg (Calculated) : 79.9   Vital Signs: Temp: 98 F (36.7 C) (03/12 1541) Temp Source: Oral (03/12 1018) BP: 150/88 (03/12 1541) Pulse Rate: 61 (03/12 1541)  Labs: Recent Labs    06/08/18 1026 06/08/18 1703  HGB 13.1  --   HCT 41.6  --   PLT 203  --   APTT 30  --   LABPROT 14.0  --   INR 1.1  --   HEPARINUNFRC  --  0.26*  CREATININE 5.73*  --   TROPONINI <0.03  --     Assessment  58 yo male admitted with acute onset of chest pain. Patient was found to have mild ST elevation but given CKD, patient was found to be at risk for ESRD if he underwent LHC. Currently managing medically given improvement in chest pain but cardiology to consider LHC if chest pain worsens.   Currently on IV heparin at 1050 units/hr with slightly subtherapeutic HL of 0.26. No overt s/s of bleeding per RN    Goal of Therapy:  Heparin level 0.3-0.7 units/ml Monitor platelets by anticoagulation protocol: Yes   Plan:  -Increase IV heparin to 1150 units/hr  -F/u 8 hr HL  -Daily HL, CBC  Albertina Parr, PharmD., BCPS Clinical Pharmacist Clinical phone for 06/08/18 until 10:30pm: [phone] If after 10:30pm, please refer to [facility] for unit-specific pharmacist

## 2018-06-08 NOTE — Consult Note (Addendum)
Cardiology Consultation:   Patient ID: Geoffrey Rangel MRN: [id number]; DOB: October 21, 1960  Admit date: 06/08/2018 Date of Consult: 06/08/2018  Primary Care Provider: Patient, No Pcp Per Primary Cardiologist: Lauree Chandler, MD  Primary Electrophysiologist:  None    Patient Profile:   Geoffrey Rangel is a 58 y.o. male with a hx of medication/medical noncompliance, untreated HTN, previous DM and obesity, CKD stage IV, and current smoker who is being seen today for the evaluation of chest pain at the request of Dr. Rogene Houston.  History of Present Illness:   Geoffrey Rangel was told he would need HD in 2018, but patient refused. Last creatinine in 2018 was 3.28.  He has also been told he was hypertensive, but refused medications. He was previously obese with hyperlipidemia and probable diabetes. However, he lost 30lbs+ rapidly and unintentionally and was told his cholesterol was improved and he was no longer diabetic.  He has not seen a provider since 2018.  He presented to [facility] with onset of substernal sharp stabbing pain at [phone] rated as a 9/10 this morning. The pain radiated down his left arm and felt numbness and tingling. He became very cold and dry-heaved several times. He denied SOB and diaphoresis.  He drove to work and his employer called EMS. EMS administered 1 SL nitro with improvement in his pain to a 1-2/10. He was hypertensive on arrival in the [facility]. Heparin drip started. EKG with peaked T waves in II, III, AVF, V3/4/5 with minimal ST elevation. Chest pain is currently controlled.   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 His father had premature heart disease and sounds like he had a CABG in his 44s. He died at age 18 of heart disease, CHF, and kidney failure.    Past Medical History:  Diagnosis Date   Renal disorder     History reviewed. No pertinent surgical history.   Home Medications:  Prior to Admission medications   Not on File    Inpatient  Medications: Scheduled Meds:  aspirin  324 mg Oral Once   Continuous Infusions:  sodium chloride 10 mL/hr at 06/08/18 1107   heparin 1,050 Units/hr (06/08/18 1042)   PRN Meds:   Allergies:   No Known Allergies  Social History:   Social History   Socioeconomic History   Marital status: Married    Spouse name: Not on file   Number of children: Not on file   Years of education: Not on file   Highest education level: Not on file  Occupational History   Not on file  Social Needs   Financial resource strain: Not on file   Food insecurity:    Worry: Not on file    Inability: Not on file   Transportation needs:    Medical: Not on file    Non-medical: Not on file  Tobacco Use   Smoking status: Current Every Day Smoker  Substance and Sexual Activity   Alcohol use: Never    Frequency: Never   Drug use: Never   Sexual activity: Not on file  Lifestyle   Physical activity:    Days per week: Not on file    Minutes per session: Not on file   Stress: Not on file  Relationships   Social connections:    Talks on phone: Not on file    Gets together: Not on file    Attends religious service: Not on file    Active member of club or organization: Not on file  Attends meetings of clubs or organizations: Not on file    Relationship status: Not on file   Intimate partner violence:    Fear of current or ex partner: Not on file    Emotionally abused: Not on file    Physically abused: Not on file    Forced sexual activity: Not on file  Other Topics Concern   Not on file  Social History Narrative   Not on file    Family History:   Family History  Problem Relation Age of Onset   CAD Father 31   Hypertension Father    Chronic Renal Failure Father    Congestive Heart Failure Sister      ROS:  Please see the history of present illness.   All other ROS reviewed and negative.     Physical Exam/Data:   Vitals:   06/08/18 1020 06/08/18 1028 06/08/18  1045 06/08/18 1100  BP: (!) 142/90 (!) 162/90 (!) 158/85 (!) 166/85  Pulse: 66 64 (!) 59 64  Resp: 20 20 17 18   Temp:      TempSrc:      SpO2: 98% 100% 97% 97%  Weight: 85.3 kg     Height: 6\' 1"  (1.854 m)      No intake or output data in the 24 hours ending 06/08/18 1113 Last 3 Weights 06/08/2018  Weight (lbs) 188 lb  Weight (kg) 85.276 kg     Body mass index is 24.8 kg/m.  General:  Appears older than stated age, NAD HEENT: normal Neck: no JVD Vascular: No carotid bruits  Cardiac:  normal S1, S2; RRR; no murmur  Lungs:  clear to auscultation bilaterally, no wheezing, rhonchi or rales  Abd: soft, nontender, no hepatomegaly  Ext: no edema Musculoskeletal:  No deformities, BUE and BLE strength normal and equal Skin: warm and dry  Neuro:  CNs 2-12 intact, no focal abnormalities noted Psych:  Normal affect   EKG:  The EKG was personally reviewed and demonstrates:  Sinus rhythm, peaked T waves in II, III, AVF, V3/4/5 with minimal ST elevation Telemetry:  Telemetry was personally reviewed and demonstrates:  sinus  Relevant CV Studies:  none  Laboratory Data:  ChemistryNo results for input(s): NA, K, CL, CO2, GLUCOSE, BUN, CREATININE, CALCIUM, GFRNONAA, GFRAA, ANIONGAP in the last 168 hours.  No results for input(s): PROT, ALBUMIN, AST, ALT, ALKPHOS, BILITOT in the last 168 hours. Hematology Recent Labs  Lab 06/08/18 1026  WBC 8.1  RBC 4.46  HGB 13.1  HCT 41.6  MCV 93.3  MCH 29.4  MCHC 31.5  RDW 13.8  PLT 203   Cardiac EnzymesNo results for input(s): TROPONINI in the last 168 hours. No results for input(s): TROPIPOC in the last 168 hours.  BNPNo results for input(s): BNP, PROBNP in the last 168 hours.  DDimer No results for input(s): DDIMER in the last 168 hours.  Radiology/Studies:  Dg Chest Port 1 View  Result Date: 06/08/2018 CLINICAL DATA:  Chest pain and shortness of breath EXAM: PORTABLE CHEST 1 VIEW COMPARISON:  None. FINDINGS: The heart size and  mediastinal contours are within normal limits. Both lungs are clear. The visualized skeletal structures are unremarkable. IMPRESSION: No active disease. Electronically Signed   By: Inez Catalina M.D.   On: 06/08/2018 10:44    Assessment and Plan:   1. Chest pain with EKG changes 2. CKD at least stage IV, previously needed HD 3. Smoker 4. Hypertension - noncompliant on medications  This is a difficult situation given  the patient's overall clinical picture and medical noncompliance. He had refused the recommendation of HD in 2018 and has refused medications to treat HTN, HLD, and DM in the past. He had significant weight loss and was told his cholesterol was improved and he was no longer diabetic. He is a current smoker and has a significant family history of premature heart disease. Heart catheterization was discussed with the patient, including effects on renal function and possibility that he would need HD after heart cath. His chest pain is currently controlled. Awaiting labs before making a decision about heart cath as long as his symptoms are controlled. If he becomes unstable, may need to proceed with heart cath emergently. First POC troponin negative at 1009 with symptom onset at 0845, per the patient.   Continue with heparin drip. SL nitro/nitro paste as needed.   Will ask Hospitalist to admit.        For questions or updates, please contact [place] Please consult [email or web address] for contact info under     Signed, Ledora Bottcher, PA  06/08/2018 11:13 AM   I have personally seen and examined this patient with Doreene Adas, PA. I agree with the assessment and plan as outlined above. I have reviewed all available labs and his EKG. He presented to the ED with c/o chest pain, sudden onset this am. EKG with subtle inferior ST elevation. He was markedly hypertensive per EMS. He was given NTG and his pain is now nearly resolved. First troponin is negative. He has a history of stage 4  kidney disease, HTN, DM and tobacco abuse. He was told 2 years ago that he would need dialysis soon so he chose to stop all medications and not go back to the doctor. He has been smoking 3 packs per day and taking no medications. He has not followed up with his primary care doctor or Nephrology (Dr. Candiss Norse at [facility]) since May of 2018.  My exam:  General: Well developed, well nourished, NAD  HEENT: OP clear, mucus membranes moist  SKIN: warm, dry. No rashes. Neuro: No focal deficits  Musculoskeletal: Muscle strength 5/5 all ext  Psychiatric: Mood and affect normal  Neck: No JVD, no carotid bruits, no thyromegaly, no lymphadenopathy.  Lungs:Clear bilaterally, no wheezes, rhonci, crackles Cardiovascular: Regular rate and rhythm. No murmurs, gallops or rubs. Abdomen:Soft. Bowel sounds present. Non-tender.  Extremities: No lower extremity edema. Pulses are 2 + in the bilateral DP/PT.  Plan: Chest pain with subtle ST elevation in a long time smoker with stage 4 CKD, DM and HTN. He likely has high grade multi-vessel CAD. Given his presentation with chest pain and the subtle EKG changes, I would normally proceed to a cardiac cath today. However, he likely has worsening of his kidney function (no labs since 2018 and no labs back yet today) and a cardiac cath would almost certainly lead to ESRD. He is not ready to consider this. I have spoken to the patient and his family. His chest pain has improved. I think it is reasonable to start IV heparin and IV NTG and cycle troponin. He will need to be admitted by the IM service with a Nephrology consultation. If he has worsening of his chest pain with dynamic EKG changes, we would have to consider cardiac catheterization today. BMET is pending at this time.   We will follow along.

## 2018-06-08 NOTE — Progress Notes (Signed)
   06/08/18 1032  Clinical Encounter Type  Visited With Patient and family together  Visit Type Initial;Code  Referral From Nurse  Consult/Referral To Chaplain  The chaplain responded to Code Stemi.  The chaplain introduced herself to the Pt. The Pt. introduced the chaplain to his wife and daughter.  The Pt. wife declined spiritual care at this time.  The chaplain shared with the Pt. Wife how to contact a chaplain if needed through the RN.

## 2018-06-08 NOTE — ED Notes (Signed)
Cardiology at bedside.

## 2018-06-08 NOTE — ED Notes (Signed)
Zoll pads placed on patient per MD.

## 2018-06-08 NOTE — ED Notes (Signed)
Admit provider at bedside 

## 2018-06-08 NOTE — ED Notes (Signed)
Cardiology PA at bedside. 

## 2018-06-08 NOTE — ED Notes (Signed)
Patient states headache 7/10 achy throbbing. Alert answering and following commands appropriate.

## 2018-06-08 NOTE — Consult Note (Signed)
[facility] Admit Date: 06/08/2018 06/08/2018 Rexene Agent Requesting Physician:   Evette Doffing MD  Reason for Consult:  Progressive CKD, ACS, HTN HPI:  58 year old male who presented earlier this morning to the emergency department with chest pain.  PMH Incudes:  Hypertension, on no medications, not actively following with physician  Progressive CKD, historically seen by Dr. Candiss Norse in [place] but not in the past year or 2.  Has a family history in men on the father's side, denies history of hearing loss or vision abnormalities.  Denies knowledge of proteinuria and hematuria.  Father had ESRD, deceased 4 months after kidney transplant; was on hemodialysis for a period of time.  Tobacco user using 2 to 3 packs/day  History of hyperlipidemia  Patient's chest pain was substernal, radiated into the left upper extremity, accompanied by some nausea and vomiting.  Treated with nitroglycerin with relief.  Troponins have been negative x2.  EKG with concern for elevation in the ST segment in the anterior leads.  Seen by cardiology, will be considered for cardiac catheterization except for renal function.  Presenting serum creatinine is 5.7.  Potassium 5.4 and serum bicarbonate of 15.  BUN 71.  Hemoglobin 13.1.  Chest x-ray negative for acute cardiopulmonary process.  Patient without lower urinary tract symptoms.  Denies use of nonsteroidals.  No LEE.    Creatinine, Ser (mg/dL)  Date Value  06/08/2018 5.73 (H)  ]  ROS Balance of 12 systems is negative w/ exceptions as above  PMH  Past Medical History:  Diagnosis Date  . Renal disorder    PSH History reviewed. No pertinent surgical history. FH  Family History  Problem Relation Age of Onset  . CAD Father 17  . Hypertension Father   . Chronic Renal Failure Father   . Congestive Heart Failure Sister    SH  reports that he has been smoking. He does not have any smokeless tobacco history on file. He reports that he does not drink alcohol  or use drugs. Allergies No Known Allergies Home medications Prior to Admission medications   Not on File    Current Medications Scheduled Meds: . aspirin  324 mg Oral Once  . nicotine  21 mg Transdermal Daily  . sodium zirconium cyclosilicate  5 g Oral Once   Continuous Infusions: . sodium chloride 10 mL/hr at 06/08/18 1107  . heparin 1,050 Units/hr (06/08/18 1042)  . nitroGLYCERIN     PRN Meds:.  CBC Recent Labs  Lab 06/08/18 1026  WBC 8.1  NEUTROABS 5.5  HGB 13.1  HCT 41.6  MCV 93.3  PLT 188   Basic Metabolic Panel Recent Labs  Lab 06/08/18 1026  NA 138  K 5.4*  CL 112*  CO2 15*  GLUCOSE 103*  BUN 71*  CREATININE 5.73*  CALCIUM 9.1    Physical Exam giving any more pain medicine she is sleeping Blood pressure (!) 158/86, pulse (!) 58, temperature 98.7 F (37.1 C), temperature source Oral, resp. rate 20, height 6\' 1"  (1.854 m), weight 85.3 kg, SpO2 99 %. 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  1. CKD 5, etiology  unclear, question Alport's or other genetic kidney disease; not uremic at the current time 2. Acute coronary syndrome, cardiology following, some concern for EKG changes in the inferior leads, on heparin and nitroglycerin drip 3. Hypertension, chronically uncontrolled 4. Ongoing tobacco use 5. Hyperkalemia, mild, Lokelma ordered  Plan 1. At length discussed dialysis modalities, logistics of starting dialysis.  Understandably he has been quite fearful of this but is not opposed to dialysis if indicated.  He further understands that urgent cardiac  evaluation might require accepting risk of progressive kidney failure and initiation of RRT. 2. Patient very interested in home modalities, PD especially.  This could be something we look into in the near future.  Not currently an option if necessary hospital. 3. UA, UP/C 4. Renal US, PTH 5. Will follow along closely 6. Daily weights, Daily Renal Panel, Strict I/Os, Avoid nephrotoxins (NSAIDs, judicious IV Contrast)    Pearson Grippe MD 06/08/2018, 1:25 PM

## 2018-06-08 NOTE — ED Notes (Signed)
ED TO INPATIENT HANDOFF REPORT  ED Nurse Name and Phone #: Marya Amsler RN:  [phone]  S Name/Age/Gender Geoffrey Rangel 58 y.o. male Room/Bed: 041C/041C  Code Status   Code Status: Not on file  Home/SNF/Other Home Patient oriented to: self, place, situation, time Is this baseline? Yes   Triage Complete: Triage complete  Chief Complaint cp  Triage Note Onset today driving to work developed chest pain radiating to back. 8/10 pressure. Work gave aspirin 325 mg and EMS gave 1 nitro. Pain decreased to 5/10 achy pressure. Alert answering and following commands appropriate.    Allergies No Known Allergies  Level of Care/Admitting Diagnosis ED Disposition    ED Disposition Condition [facility] Area: [place] [[id number]]  Level of Care: Progressive [102]  Diagnosis: Chest pain [893734]  Admitting Physician: Axel Filler [[id number]]  Attending Physician: Axel Filler [[id number]]  Estimated length of stay: past midnight tomorrow  Certification:: I certify this patient will need inpatient services for at least 2 midnights  PT Class (Do Not Modify): Inpatient [101]  PT Acc Code (Do Not Modify): Private [1]       B Medical/Surgery History Past Medical History:  Diagnosis Date  . Renal disorder    History reviewed. No pertinent surgical history.   A IV Location/Drains/Wounds Patient Lines/Drains/Airways Status   Active Line/Drains/Airways    Name:   Placement date:   Placement time:   Site:   Days:   Peripheral IV 06/08/18 Left Antecubital   06/08/18    1020    Antecubital   less than 1          Intake/Output Last 24 hours No intake or output data in the 24 hours ending 06/08/18 1302  Labs/Imaging Results for orders placed or performed during the hospital encounter of 06/08/18 (from the past 48 hour(s))  I-stat troponin, ED     Status: None   Collection Time: 06/08/18 10:09 AM  Result Value Ref Range   Troponin i, poc 0.01  0.00 - 0.08 ng/mL   Comment 3            Comment: Due to the release kinetics of cTnI, a negative result within the first hours of the onset of symptoms does not rule out myocardial infarction with certainty. If myocardial infarction is still suspected, repeat the test at appropriate intervals.   CBC with Differential/Platelet     Status: None   Collection Time: 06/08/18 10:26 AM  Result Value Ref Range   WBC 8.1 4.0 - 10.5 K/uL   RBC 4.46 4.22 - 5.81 MIL/uL   Hemoglobin 13.1 13.0 - 17.0 g/dL   HCT 41.6 39.0 - 52.0 %   MCV 93.3 80.0 - 100.0 fL   MCH 29.4 26.0 - 34.0 pg   MCHC 31.5 30.0 - 36.0 g/dL   RDW 13.8 11.5 - 15.5 %   Platelets 203 150 - 400 K/uL   nRBC 0.0 0.0 - 0.2 %   Neutrophils Relative % 67 %   Neutro Abs 5.5 1.7 - 7.7 K/uL   Lymphocytes Relative 22 %   Lymphs Abs 1.8 0.7 - 4.0 K/uL   Monocytes Relative 8 %   Monocytes Absolute 0.6 0.1 - 1.0 K/uL   Eosinophils Relative 2 %   Eosinophils Absolute 0.1 0.0 - 0.5 K/uL   Basophils Relative 1 %   Basophils Absolute 0.1 0.0 - 0.1 K/uL   Immature Granulocytes 0 %   Abs Immature Granulocytes 0.03 0.00 -  0.07 K/uL    Comment: Performed at [facility], [place] [address] [place], [place] [address]  Protime-INR     Status: None   Collection Time: 06/08/18 10:26 AM  Result Value Ref Range   Prothrombin Time 14.0 11.4 - 15.2 seconds   INR 1.1 0.8 - 1.2    Comment: (NOTE) INR goal varies based on device and disease states. Performed at [facility], [place] [address], [place], [place] [address]   APTT     Status: None   Collection Time: 06/08/18 10:26 AM  Result Value Ref Range   aPTT 30 24 - 36 seconds    Comment: Performed at [facility] [address] [place], [place] [address]  Comprehensive metabolic panel     Status: Abnormal   Collection Time: 06/08/18 10:26 AM  Result Value Ref Range   Sodium 138 135 - 145 mmol/L   Potassium 5.4 (H) 3.5 - 5.1 mmol/L   Chloride 112 (H) 98 - 111 mmol/L   CO2  15 (L) 22 - 32 mmol/L   Glucose, Bld 103 (H) 70 - 99 mg/dL   BUN 71 (H) 6 - 20 mg/dL   Creatinine, Ser 5.73 (H) 0.61 - 1.24 mg/dL   Calcium 9.1 8.9 - 10.3 mg/dL   Total Protein 7.0 6.5 - 8.1 g/dL   Albumin 3.9 3.5 - 5.0 g/dL   AST 17 15 - 41 U/L   ALT 14 0 - 44 U/L   Alkaline Phosphatase 69 38 - 126 U/L   Total Bilirubin 0.7 0.3 - 1.2 mg/dL   GFR calc non Af Amer 10 (L) >60 mL/min   GFR calc Af Amer 12 (L) >60 mL/min   Anion gap 11 5 - 15    Comment: Performed at [facility], [place] [address] [place], [place] [address]  Troponin I - ONCE - STAT     Status: None   Collection Time: 06/08/18 10:26 AM  Result Value Ref Range   Troponin I <0.03 <0.03 ng/mL    Comment: Performed at [facility] [address] [place], [place] [address]   Dg Chest Port 1 View  Result Date: 06/08/2018 CLINICAL DATA:  Chest pain and shortness of breath EXAM: PORTABLE CHEST 1 VIEW COMPARISON:  None. FINDINGS: The heart size and mediastinal contours are within normal limits. Both lungs are clear. The visualized skeletal structures are unremarkable. IMPRESSION: No active disease. Electronically Signed   By: Inez Catalina M.D.   On: 06/08/2018 10:44    Pending Labs Unresulted Labs (From admission, onward)    Start     Ordered   06/09/18 0500  Heparin level (unfractionated)  Daily,   R     06/08/18 1031   06/09/18 0500  CBC  Daily,   R     06/08/18 1031   06/08/18 1700  Heparin level (unfractionated)  Once-Timed,   R     06/08/18 1031   06/08/18 1026  Lipid panel  Once,   AD     06/08/18 1026   Signed and Held  HIV antibody (Routine Testing)  Tomorrow morning,   R     Signed and Held   Signed and Held  Basic metabolic panel  Tomorrow morning,   R     Signed and Held          Vitals/Pain Today's Vitals   06/08/18 1145 06/08/18 1200 06/08/18 1230 06/08/18 1245  BP: 111/70 (!) 149/80  Marland Kitchen)  158/86  Pulse: (!) 59 (!) 56 (!) 57 (!) 58  Resp: 18 18 20 20   Temp:      TempSrc:      SpO2:  98% 99% 99% 99%  Weight:      Height:      PainSc:        Isolation Precautions No active isolations  Medications Medications  0.9 %  sodium chloride infusion ( Intravenous New Bag/Given 06/08/18 1107)  aspirin chewable tablet 324 mg (324 mg Oral Not Given 06/08/18 1036)  heparin ADULT infusion 100 units/mL (25000 units/255mL sodium chloride 0.45%) (1,050 Units/hr Intravenous New Bag/Given 06/08/18 1042)  nicotine (NICODERM CQ - dosed in mg/24 hours) patch 21 mg (has no administration in time range)  sodium zirconium cyclosilicate (LOKELMA) packet 5 g (has no administration in time range)  heparin bolus via infusion 4,000 Units (4,000 Units Intravenous Bolus from Bag 06/08/18 1042)  acetaminophen (TYLENOL) tablet 650 mg (650 mg Oral Given 06/08/18 1222)    Mobility walks Low fall risk   Focused Assessments Cardiac Assessment Handoff:  Cardiac Rhythm: Normal sinus rhythm Lab Results  Component Value Date   TROPONINI <0.03 06/08/2018   No results found for: DDIMER Does the Patient currently have chest pain? No     R Recommendations: See Admitting Provider Note  Report given to:   Additional Notes:

## 2018-06-08 NOTE — H&P (Signed)
Date: 06/08/2018               Patient Name:  Geoffrey Rangel MRN: [id number]  DOB: 10-Aug-1960 Age / Sex: 58 y.o., male   PCP: Patient, No Pcp Per         Medical Service: Internal Medicine Teaching Service         Attending Physician: Dr. Evette Doffing, Mallie Mussel, *    First Contact: Dr. Laural Golden Pager: [phone]  Second Contact: Dr. Philipp Ovens Pager: [phone]       After Hours (After 5p/  First Contact Pager: [phone]  weekends / holidays): Second Contact Pager: [phone]   Chief Complaint: Chest pain  History of Present Illness: [name] is a 58 yo male with a history of untreated HTN, preveious DM, current every day tobacco user and CKD stage IV presenting with acute onset of substernal chest pain. He states he was in his usual state of health until this morning when he suddenly had substernal stabbing chest pain that radiated down his left arm while he was driving. He had associated numbness and tingling of his left arm, dry heaving and nausea. Denies SOB or diaphoresis. His pain was a 9/10 and a 4-5/10 after he was given sublingual nitroglycerin.  He is currently having substernal chest tightness. He states he has never had any episodes like this before or history of heart disease. He states he typically has no trouble ambulating and denies any SOB or chest pain on exertion.   Meds:   No outpatient medications have been marked as taking for the 06/08/18 encounter [facility] Encounter).     Allergies: Allergies as of 06/08/2018  . (No Known Allergies)   Past Medical History:  Diagnosis Date  . Renal disorder     Family History:  Father passed of CKD and heart failure.  Family History  Problem Relation Age of Onset  . CAD Father 59  . Hypertension Father   . Chronic Renal Failure Father   . 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 He smokes 3 packs of cigarettes a day since he was 46. He  denies any alcohol or illicit drug use.   Social History   Socioeconomic History  . Marital status: Married    Spouse name: Not on file  . Number of children: Not on file  . Years of education: Not on file  . Highest education level: Not on file  Occupational History  . Not on file  Social Needs  . Financial resource strain: Not on file  . Food insecurity:    Worry: Not on file    Inability: Not on file  . Transportation needs:    Medical: Not on file    Non-medical: Not on file  Tobacco Use  . Smoking status: Current Every Day Smoker  Substance and Sexual Activity  . Alcohol use: Never    Frequency: Never  . Drug use: Never  . Sexual activity: Not on file  Lifestyle  . Physical activity:    Days per week: Not on file    Minutes per session: Not on file  . Stress: Not on file  Relationships  . Social connections:    Talks on phone: Not on file    Gets together: Not on file    Attends religious service: Not on file    Active member of club or organization:  Not on file    Attends meetings of clubs or organizations: Not on file    Relationship status: Not on file  . Intimate partner violence:    Fear of current or ex partner: Not on file    Emotionally abused: Not on file    Physically abused: Not on file    Forced sexual activity: Not on file  Other Topics Concern  . Not on file  Social History Narrative  . Not on file    Review of Systems: A complete ROS was negative except as per HPI.   Physical Exam: Blood pressure (!) 158/86, pulse (!) 58, temperature 98.7 F (37.1 C), temperature source Oral, resp. rate 20, height 6\' 1"  (1.854 m), weight 85.3 kg, SpO2 99 %.  Physical Exam  Constitutional: He is oriented to person, place, and time and well-developed, well-nourished, and in no distress.  Cardiovascular: Normal rate, regular rhythm and normal heart sounds.  No murmur heard. Pulmonary/Chest: Effort normal and breath sounds normal. No respiratory distress.  He has no wheezes.  Abdominal: Soft. Bowel sounds are normal. He exhibits no distension. There is no abdominal tenderness.  Musculoskeletal:        General: No tenderness or edema.  Neurological: He is alert and oriented to person, place, and time.  Skin: Skin is warm and dry. He is not diaphoretic.  Psychiatric: Mood, memory, affect and judgment normal.    EKG: personally reviewed my interpretation is peaked T waves in II, III, and AVF, V3/4/5 with minimal ST elevation  CXR: personally reviewed my interpretation is no acute cardiopulmonary disease.   Assessment & Plan by Problem: Active Problems:   Chest pain  [name] is a 58 yo male with a history of untreated HTN, preveious DM, everyday tobacco user and CKD stage IV presenting with acute onset of substernal chest pain. He has a history of non-compliance to medications and was last seen by a physician 2 years ago when he was told he needed HD and declined.   Chest Pain: Patient presented with acute onset of stabbing substernal chest pain radiating to left arm with associated nausea and dry heaving. Improved with SL nitro. EKG illustrated peaked T waves in II, III, and AVF, V3/4/5 with minimal ST elevation. Initial troponin <0.03. Risk factors include HTN, tobacco use, and family history. Cardiology will consider a cardiac catheterization today if he has worsening of his chest pain with dynamic EKG changes. Cath will likely lead to ESRD, nephrology has been consulted.  - Continue IV heparin and IV nitroglycerin - Cardiac monitoring  - Trend troponins  - Repeat am EKG  - Aspirin 81 mg  - Cardiology on board, appreciate recommendations  - Nephrology consulted, appreciate recommendations   HTN 166/85 on admission. He is not on any anti-hypertensives and has declined therapy in the past. - Telemetry - Continue to monitor - If he remains >545'G systolic we will start anti-hypertensive therapy   CKD IV: Patient declined HD and PD  in the past. Last Cr in 2018 3.28, 5.73 on admission. Kidney needle biopsy in June 2018 showed moderate arterionephrosclerosis in association with focal and segmental glomerular tuft sclerosis and mild glomerulomegaly. No evidence of an immune complex mediated or active glomerulonephritis.  - Nephrology consulted, appreciate recommendations   Diet: NPO VTE ppx: heparin Full Code  Dispo: Admit patient to Inpatient with expected length of stay greater than 2 midnights.  SignedMike Craze, DO 06/08/2018, 1:06 PM  Pager: [phone]

## 2018-06-08 NOTE — Progress Notes (Signed)
ANTICOAGULATION CONSULT NOTE - Initial Consult  Pharmacy Consult for heparin Indication: chest pain/ACS  Patient Measurements: Height: 6\' 1"  (185.4 cm) Weight: 188 lb (85.3 kg) IBW/kg (Calculated) : 79.9   Vital Signs: Temp: 98.7 F (37.1 C) (03/12 1018) Temp Source: Oral (03/12 1018) BP: 142/90 (03/12 1020) Pulse Rate: 66 (03/12 1020)  Labs: No results for input(s): HGB, HCT, PLT, APTT, LABPROT, INR, HEPARINUNFRC, HEPRLOWMOCWT, CREATININE, CKTOTAL, CKMB, TROPONINI in the last 72 hours.  Assessment  58 yo male admitted with acute onset of chest pain. He received aspirin 324 mg prior to arrival. He reportedly has kidney disease - currently awaiting labs. He does not take any medications prior to admission. Planning to start heparin infusion while awaiting decision on cardiac cath.   Goal of Therapy:  Heparin level 0.3-0.7 units/ml Monitor platelets by anticoagulation protocol: Yes   Plan:  -Heparin bolus 4000 units x1 then 1050 untis/hr -Daily HL, CBC -Check first level in 6 hours  Harvel Quale 06/08/2018,10:29 AM

## 2018-06-08 NOTE — ED Notes (Signed)
Zoll pads placed on patient.  

## 2018-06-08 NOTE — ED Notes (Signed)
EKG completed at 1003 and 1023. Patient arrived on Epic downtime.

## 2018-06-09 ENCOUNTER — Inpatient Hospital Stay (HOSPITAL_COMMUNITY): Payer: 59

## 2018-06-09 DIAGNOSIS — E7800 Pure hypercholesterolemia, unspecified: Secondary | ICD-10-CM

## 2018-06-09 DIAGNOSIS — I120 Hypertensive chronic kidney disease with stage 5 chronic kidney disease or end stage renal disease: Secondary | ICD-10-CM

## 2018-06-09 DIAGNOSIS — E875 Hyperkalemia: Secondary | ICD-10-CM | POA: Diagnosis not present

## 2018-06-09 DIAGNOSIS — N185 Chronic kidney disease, stage 5: Secondary | ICD-10-CM | POA: Diagnosis not present

## 2018-06-09 DIAGNOSIS — R079 Chest pain, unspecified: Secondary | ICD-10-CM | POA: Diagnosis not present

## 2018-06-09 DIAGNOSIS — I10 Essential (primary) hypertension: Secondary | ICD-10-CM | POA: Diagnosis not present

## 2018-06-09 DIAGNOSIS — N400 Enlarged prostate without lower urinary tract symptoms: Secondary | ICD-10-CM

## 2018-06-09 DIAGNOSIS — E1122 Type 2 diabetes mellitus with diabetic chronic kidney disease: Secondary | ICD-10-CM | POA: Diagnosis not present

## 2018-06-09 LAB — CBC
HCT: 38.1 % — ABNORMAL LOW (ref 39.0–52.0)
Hemoglobin: 12.3 g/dL — ABNORMAL LOW (ref 13.0–17.0)
MCH: 29.9 pg (ref 26.0–34.0)
MCHC: 32.3 g/dL (ref 30.0–36.0)
MCV: 92.5 fL (ref 80.0–100.0)
Platelets: 187 10*3/uL (ref 150–400)
RBC: 4.12 MIL/uL — ABNORMAL LOW (ref 4.22–5.81)
RDW: 13.6 % (ref 11.5–15.5)
WBC: 7.9 10*3/uL (ref 4.0–10.5)
nRBC: 0 % (ref 0.0–0.2)

## 2018-06-09 LAB — BASIC METABOLIC PANEL
Anion gap: 8 (ref 5–15)
BUN: 72 mg/dL — ABNORMAL HIGH (ref 6–20)
CALCIUM: 8.5 mg/dL — AB (ref 8.9–10.3)
CO2: 18 mmol/L — ABNORMAL LOW (ref 22–32)
Chloride: 112 mmol/L — ABNORMAL HIGH (ref 98–111)
Creatinine, Ser: 5.47 mg/dL — ABNORMAL HIGH (ref 0.61–1.24)
GFR calc Af Amer: 12 mL/min — ABNORMAL LOW (ref >60)
GFR, EST NON AFRICAN AMERICAN: 11 mL/min — AB (ref >60)
Glucose, Bld: 87 mg/dL (ref 70–99)
Potassium: 4.8 mmol/L (ref 3.5–5.1)
Sodium: 138 mmol/L (ref 135–145)

## 2018-06-09 LAB — TROPONIN I

## 2018-06-09 LAB — NM MYOCAR MULTI W/SPECT W/WALL MOTION / EF
Peak HR: 112 {beats}/min
Rest HR: 65 {beats}/min

## 2018-06-09 LAB — ECHOCARDIOGRAM COMPLETE
Height: 73 in
Weight: 2795.2 oz

## 2018-06-09 LAB — PARATHYROID HORMONE, INTACT (NO CA): PTH: 227 pg/mL — ABNORMAL HIGH (ref 15–65)

## 2018-06-09 LAB — HIV ANTIBODY (ROUTINE TESTING W REFLEX): HIV Screen 4th Generation wRfx: NONREACTIVE

## 2018-06-09 LAB — TSH: TSH: 1.781 u[IU]/mL (ref 0.350–4.500)

## 2018-06-09 LAB — HEPARIN LEVEL (UNFRACTIONATED)
Heparin Unfractionated: 0.18 IU/mL — ABNORMAL LOW (ref 0.30–0.70)
Heparin Unfractionated: 0.22 IU/mL — ABNORMAL LOW (ref 0.30–0.70)

## 2018-06-09 MED ORDER — TECHNETIUM TC 99M TETROFOSMIN IV KIT
30.0000 | PACK | Freq: Once | INTRAVENOUS | Status: AC | PRN
  Administered 2018-06-09: 30 via INTRAVENOUS

## 2018-06-09 MED ORDER — TECHNETIUM TC 99M TETROFOSMIN IV KIT
10.0000 | PACK | Freq: Once | INTRAVENOUS | Status: AC | PRN
  Administered 2018-06-09: 10 via INTRAVENOUS

## 2018-06-09 MED ORDER — SODIUM BICARBONATE 650 MG PO TABS
650.0000 mg | ORAL_TABLET | Freq: Two times a day (BID) | ORAL | Status: DC
  Administered 2018-06-09 – 2018-06-10 (×3): 650 mg via ORAL
  Filled 2018-06-09 (×3): qty 1

## 2018-06-09 MED ORDER — HEPARIN BOLUS VIA INFUSION
1000.0000 [IU] | Freq: Once | INTRAVENOUS | Status: AC
  Administered 2018-06-09: 1000 [IU] via INTRAVENOUS
  Filled 2018-06-09: qty 1000

## 2018-06-09 MED ORDER — HEPARIN SODIUM (PORCINE) 5000 UNIT/ML IJ SOLN
5000.0000 [IU] | Freq: Three times a day (TID) | INTRAMUSCULAR | Status: DC
  Administered 2018-06-09: 5000 [IU] via SUBCUTANEOUS
  Filled 2018-06-09: qty 1

## 2018-06-09 MED ORDER — REGADENOSON 0.4 MG/5ML IV SOLN
INTRAVENOUS | Status: AC
  Filled 2018-06-09: qty 5

## 2018-06-09 MED ORDER — ASPIRIN 81 MG PO CHEW
81.0000 mg | CHEWABLE_TABLET | Freq: Once | ORAL | Status: AC
  Administered 2018-06-09: 81 mg via ORAL
  Filled 2018-06-09: qty 1

## 2018-06-09 MED ORDER — REGADENOSON 0.4 MG/5ML IV SOLN
0.4000 mg | Freq: Once | INTRAVENOUS | Status: AC
  Administered 2018-06-09: 0.4 mg via INTRAVENOUS
  Filled 2018-06-09: qty 5

## 2018-06-09 MED ORDER — ISOSORBIDE MONONITRATE ER 30 MG PO TB24
30.0000 mg | ORAL_TABLET | Freq: Every day | ORAL | Status: DC
  Administered 2018-06-09 – 2018-06-10 (×2): 30 mg via ORAL
  Filled 2018-06-09 (×2): qty 1

## 2018-06-09 MED ORDER — ATORVASTATIN CALCIUM 40 MG PO TABS
40.0000 mg | ORAL_TABLET | Freq: Every day | ORAL | Status: DC
  Administered 2018-06-09: 40 mg via ORAL
  Filled 2018-06-09: qty 1

## 2018-06-09 NOTE — Progress Notes (Signed)
ANTICOAGULATION CONSULT NOTE - Follow Up Consult  Pharmacy Consult for Heparin Indication: chest pain/ACS  Patient Measurements: Height: 6\' 1"  (185.4 cm) Weight: 174 lb 11.2 oz (79.2 kg) IBW/kg (Calculated) : 79.9   Vital Signs: Temp: 97.7 F (36.5 C) (03/13 0508) Temp Source: Oral (03/13 0508) BP: 130/71 (03/13 1100) Pulse Rate: 90 (03/13 1100)  Labs: Recent Labs    06/08/18 1026 06/08/18 1703 06/08/18 2252 06/09/18 0229 06/09/18 1157  HGB 13.1  --   --  12.3*  --   HCT 41.6  --   --  38.1*  --   PLT 203  --   --  187  --   APTT 30  --   --   --   --   LABPROT 14.0  --   --   --   --   INR 1.1  --   --   --   --   HEPARINUNFRC  --  0.26*  --  0.18* 0.22*  CREATININE 5.73*  --   --  5.47*  --   TROPONINI <0.03 <0.03 <0.03  --   --     Assessment  58 yo male admitted with acute onset of chest pain. Pharmacy dosing heparin. Plans noted for Lexiscan myoview today and Echo -heparin level= 0.22  Goal of Therapy:  Heparin level 0.3-0.7 units/ml Monitor platelets by anticoagulation protocol: Yes   Plan:  Heparin bolus 1000 units then increase to 1450 units/hr Heparin level  in 8 hrs Daily CBC/HL  Hildred Laser, PharmD Clinical Pharmacist **Pharmacist phone directory can now be found on [email or web address] ([email or web address]).  Listed under [place].

## 2018-06-09 NOTE — Evaluation (Signed)
Physical Therapy Evaluation & Discharge Patient Details Name: Geoffrey Rangel MRN: [id number] DOB: 1960/07/21 Today's Date: 06/09/2018   History of Present Illness  Pt is a 58 y.o. male admitted 06/08/18 with onset of substernal chest pain; cardiac evaluation complicated by CKD V. Plan for [facility] 3/13; potential for cardiac cath. PMH includes CKD, HTN, HLD, tobacco abuse.    Clinical Impression  Patient evaluated by Physical Therapy with no further acute PT needs identified. PTA, pt indep, lives with wife, works as Administrator. Today, pt indep with all mobility; VSS. All education has been completed and the patient has no further questions. Acute PT is signing off. Thank you for this referral.    Follow Up Recommendations No PT follow up    Equipment Recommendations  None recommended by PT    Recommendations for Other Services       Precautions / Restrictions Precautions Precautions: None Restrictions Weight Bearing Restrictions: No      Mobility  Bed Mobility Overal bed mobility: Independent                Transfers Overall transfer level: Independent                  Ambulation/Gait Ambulation/Gait assistance: Independent Gait Distance (Feet): 400 Feet Assistive device: None Gait Pattern/deviations: WFL(Within Functional Limits)   Gait velocity interpretation: >2.62 ft/sec, indicative of community ambulatory    Stairs            Wheelchair Mobility    Modified Rankin (Stroke Patients Only)       Balance Overall balance assessment: Needs assistance   Sitting balance-Leahy Scale: Good       Standing balance-Leahy Scale: Good                   Standardized Balance Assessment Standardized Balance Assessment : Dynamic Gait Index   Dynamic Gait Index Level Surface: Normal Change in Gait Speed: Mild Impairment Gait with Horizontal Head Turns: Normal Gait with Vertical Head Turns: Normal Gait and Pivot Turn: Normal Step Over  Obstacle: Mild Impairment Step Around Obstacles: Normal Steps: Normal Total Score: 22       Pertinent Vitals/Pain Pain Assessment: No/denies pain    Home Living Family/patient expects to be discharged to:: Private residence Living Arrangements: Spouse/significant other Available Help at Discharge: Family;Available 24 hours/day Type of Home: House       Home Layout: One level Home Equipment: None      Prior Function Level of Independence: Independent         Comments: Works as Management consultant Extremity Assessment Upper Extremity Assessment: Overall WFL for tasks assessed    Lower Extremity Assessment Lower Extremity Assessment: Overall WFL for tasks assessed    Cervical / Trunk Assessment Cervical / Trunk Assessment: Normal  Communication   Communication: No difficulties  Cognition Arousal/Alertness: Awake/alert Behavior During Therapy: WFL for tasks assessed/performed Overall Cognitive Status: Within Functional Limits for tasks assessed                                        General Comments      Exercises     Assessment/Plan    PT Assessment Patent does not need any further PT services  PT Problem List  PT Treatment Interventions      PT Goals (Current goals can be found in the Care Plan section)  Acute Rehab PT Goals PT Goal Formulation: All assessment and education complete, DC therapy    Frequency     Barriers to discharge        Co-evaluation               AM-PAC PT "6 Clicks" Mobility  Outcome Measure Help needed turning from your back to your side while in a flat bed without using bedrails?: None Help needed moving from lying on your back to sitting on the side of a flat bed without using bedrails?: None Help needed moving to and from a bed to a chair (including a wheelchair)?: None Help needed standing up from a chair using your arms  (e.g., wheelchair or bedside chair)?: None Help needed to walk in hospital room?: None Help needed climbing 3-5 steps with a railing? : None 6 Click Score: 24    End of Session   Activity Tolerance: Patient tolerated treatment well Patient left: in bed;with call bell/phone within reach;with family/visitor present Nurse Communication: Mobility status PT Visit Diagnosis: Other abnormalities of gait and mobility (R26.89)    Time: 5188-4166 PT Time Calculation (min) (ACUTE ONLY): 10 min   Charges:   PT Evaluation $PT Eval Low Complexity: [place], PT, DPT Acute Rehabilitation Services  Pager [phone] Office [place] 06/09/2018, 10:36 AM

## 2018-06-09 NOTE — Progress Notes (Signed)
ANTICOAGULATION CONSULT NOTE - Follow Up Consult  Pharmacy Consult for Heparin Indication: chest pain/ACS  Patient Measurements: Height: 6\' 1"  (185.4 cm) Weight: 174 lb 11.2 oz (79.2 kg) IBW/kg (Calculated) : 79.9   Vital Signs: Temp: 97.5 F (36.4 C) (03/13 0045) BP: 131/78 (03/13 0045) Pulse Rate: 65 (03/13 0045)  Labs: Recent Labs    06/08/18 1026 06/08/18 1703 06/08/18 2252 06/09/18 0229  HGB 13.1  --   --  12.3*  HCT 41.6  --   --  38.1*  PLT 203  --   --  187  APTT 30  --   --   --   LABPROT 14.0  --   --   --   INR 1.1  --   --   --   HEPARINUNFRC  --  0.26*  --  0.18*  CREATININE 5.73*  --   --  5.47*  TROPONINI <0.03 <0.03 <0.03  --     Assessment  58 yo male admitted with acute onset of chest pain. Patient was found to have mild ST elevation but given CKD, patient was found to be at risk for ESRD if he underwent LHC. Currently managing medically given improvement in chest pain but cardiology to consider LHC if chest pain worsens.   Currently on IV heparin at 1050 units/hr with slightly subtherapeutic HL of 0.26. No overt s/s of bleeding per RN   3/13 AM update: heparin level level despite rate increase, no issues per RN.    Goal of Therapy:  Heparin level 0.3-0.7 units/ml Monitor platelets by anticoagulation protocol: Yes   Plan:  -Increase IV heparin to 1300 units/hr  -Re-check heparin level in 8 hours  Narda Bonds, PharmD, BCPS Clinical Pharmacist Phone: [phone]

## 2018-06-09 NOTE — Progress Notes (Addendum)
   Preliminary read on nuclear stress test showed no ischemia, EF 52%. 2D echo is still pending. Team will f/u in AM with further recs. Per D/w Dr. Martinique, [place] to change heparin gtt to DVT ppx. Spoke with pharmacist that next dose would be OK for 10pm. I also asked nurse to make pt aware of nuc result.  Dayna Dunn PA-C

## 2018-06-09 NOTE — Progress Notes (Signed)
Progress Note  Patient Name: Geoffrey Rangel Date of Encounter: 06/09/2018  Primary Cardiologist: Lauree Chandler, MD   Subjective   Feels much better today. Notes some chest "tightness" when lying on left side. No other chest pain or dyspnea.   Inpatient Medications    Scheduled Meds: . aspirin  324 mg Oral Once  . isosorbide mononitrate  30 mg Oral Daily  . nicotine  21 mg Transdermal Daily   Continuous Infusions: . sodium chloride 20 mL/hr at 06/08/18 2003  . heparin 1,300 Units/hr (06/09/18 0529)   PRN Meds: acetaminophen **OR** acetaminophen   Vital Signs    Vitals:   06/08/18 1541 06/08/18 1958 06/09/18 0045 06/09/18 0508  BP: (!) 150/88 (!) 153/84 131/78 124/81  Pulse: 61 62 65 65  Resp: 16     Temp: 98 F (36.7 C) 97.8 F (36.6 C) (!) 97.5 F (36.4 C) 97.7 F (36.5 C)  TempSrc:    Oral  SpO2: 98% 99% 96% 98%  Weight: 79.2 kg   79.2 kg  Height: 6\' 1"  (1.854 m)       Intake/Output Summary (Last 24 hours) at 06/09/2018 0934 Last data filed at 06/09/2018 0726 Gross per 24 hour  Intake 486.33 ml  Output 2350 ml  Net -1863.67 ml   Last 3 Weights 06/09/2018 06/08/2018 06/08/2018  Weight (lbs) 174 lb 11.2 oz 174 lb 11.2 oz 188 lb  Weight (kg) 79.243 kg 79.243 kg 85.276 kg      Telemetry    NSR - Personally Reviewed  ECG    NSR no acute ST changes. ? Early repol.   - Personally Reviewed  Physical Exam   GEN: No acute distress.   Neck: No JVD Cardiac: RRR, no murmurs, rubs, or gallops.  Respiratory: Clear to auscultation bilaterally. GI: Soft, nontender, non-distended  MS: No edema; No deformity. Neuro:  Nonfocal  Psych: Normal affect   Labs    Chemistry Recent Labs  Lab 06/08/18 1026 06/09/18 0229  NA 138 138  K 5.4* 4.8  CL 112* 112*  CO2 15* 18*  GLUCOSE 103* 87  BUN 71* 72*  CREATININE 5.73* 5.47*  CALCIUM 9.1 8.5*  PROT 7.0  --   ALBUMIN 3.9  --   AST 17  --   ALT 14  --   ALKPHOS 69  --   BILITOT 0.7  --   GFRNONAA 10*  11*  GFRAA 12* 12*  ANIONGAP 11 8     Hematology Recent Labs  Lab 06/08/18 1026 06/09/18 0229  WBC 8.1 7.9  RBC 4.46 4.12*  HGB 13.1 12.3*  HCT 41.6 38.1*  MCV 93.3 92.5  MCH 29.4 29.9  MCHC 31.5 32.3  RDW 13.8 13.6  PLT 203 187    Cardiac Enzymes Recent Labs  Lab 06/08/18 1026 06/08/18 1703 06/08/18 2252  TROPONINI <0.03 <0.03 <0.03    Recent Labs  Lab 06/08/18 1009 06/08/18 1010  TROPIPOC 0.01 0.01     BNPNo results for input(s): BNP, PROBNP in the last 168 hours.   DDimer No results for input(s): DDIMER in the last 168 hours.   Radiology    US Renal  Result Date: 06/08/2018 CLINICAL DATA:  Chronic renal disease. EXAM: RENAL / URINARY TRACT ULTRASOUND COMPLETE COMPARISON:  None. FINDINGS: Right Kidney: Renal measurements: 9.6 x 5.7 x 5.1 cm = volume: 146 mL. Increased cortical echogenicity. No mass or hydronephrosis visualized. Left Kidney: Renal measurements: 9.3 x 5.1 x 4.3 = volume: 108 mL. Increased cortical echogenicity.  No hydronephrosis visualized. 1.3 cm complicated cyst in the lower pole of the left kidney. Bladder: Appears normal for degree of bladder distention. The prostate gland is mildly enlarged and somewhat irregular in contour measuring 3.5 x 3.1 x 5.1 cm for a volume of 29 cc. IMPRESSION: Bilateral echogenic kidneys, consistent with medical renal disease. Mildly complicated left renal cyst, attention on follow-up. Enlargement and contour irregularity of the prostate gland. Please correlate to serum PSA values. Electronically Signed   By: Fidela Salisbury M.D.   On: 06/08/2018 15:23   Dg Chest Port 1 View  Result Date: 06/08/2018 CLINICAL DATA:  Chest pain and shortness of breath EXAM: PORTABLE CHEST 1 VIEW COMPARISON:  None. FINDINGS: The heart size and mediastinal contours are within normal limits. Both lungs are clear. The visualized skeletal structures are unremarkable. IMPRESSION: No active disease. Electronically Signed   By: Inez Catalina  M.D.   On: 06/08/2018 10:44    Cardiac Studies   none  Patient Profile     58 y.o. male with a hx of medication/medical noncompliance, untreated HTN, previous DM and obesity, CKD stage IV, and current smoker who is being seen today for the evaluation of chest pain at the request of Dr. Rogene Houston.  Assessment & Plan    1. Chest pain with some typical/atypical symptoms. Ecg is non acute. Troponin levels are all normal. No prior cardiac evaluation. He has cardiac risk factors of HTN, HLD, family history of premature cardiac disease. Evaluation complicated by stage V CKD. Symptoms could be pericardial in nature with renal disease. Given risk of contrast I would favor initital conservative approach. Will get a [employer] today and Echo. If Myoview is normal we may not need to consider cardiac cath. If abnormal, he will need cardiac cath which will hasten need for dialysis. Renal on board.  2. Stage V CKD- per renal. Expect he will need dialysis soon.  3. HTN controlled. 4. HLD start statin.  5. Tobacco abuse. Counsel on smoking cessation.  6. Hyperkalemia improved with Lokelma      For questions or updates, please contact [place] Please consult [email or web address] for contact info under        Signed, Amanee Iacovelli [place], MD  06/09/2018, 9:34 AM

## 2018-06-09 NOTE — Progress Notes (Signed)
   Severiano Gilbert presented for a nuclear stress test and it was proctored by nuc med staff without any reported immediate complications.  Stress imaging is pending at this time.  Preliminary EKG findings may be listed in the chart, but the stress test result will not be finalized until perfusion imaging is complete.  Charlie Pitter, PA-C 06/09/2018, 12:52 PM

## 2018-06-09 NOTE — Progress Notes (Signed)
[facility] ROUNDING NOTE   Subjective:   Patient is a very pleasant 58 year old gentleman with a positive family history of end-stage renal disease.  He has been evaluated and treated by Dr. Candiss Norse in [place] for the past year or 2.  He states that he has been considered a candidate for peritoneal dialysis and was scheduled to begin the process of training and placement of dialysis catheter.  His presentation creatinine was 5.7 with a GFR of less than 15 cc/min.  He continues to smoke 2 to 3 packs of cigarettes per day and has a history of hyperlipidemia.  He was brought to the hospital with chest pain and is scheduled to have a [employer] 06/09/2018.  Appreciate the assistance of Dr. Martinique.  Ultrasound reveals bilateral echogenic kidneys.  With an enlarged and irregular contour prostate gland  Medications aspirin 81 mg daily, atorvastatin 40 mg daily isosorbide 30 mg daily nicotine patches 21 mg, Lokelma x1 dose administered 06/08/2018, heparin infusion.  Blood pressure 124/81 pulse 65 temperature 97.7 O2 sats 98% room air  Urine output 1300 cc 06/08/2028  Sodium 138 potassium 4.8 chloride 112 CO2 18 BUN 72 creatinine 5.47 calcium 8.5 WBC 7.9 hemoglobin 12.3 platelets 187 PTH 227.  Phosphorus 5.6 troponin 0 0.03     Objective:  Vital signs in last 24 hours:  Temp:  [97.5 F (36.4 C)-98.7 F (37.1 C)] 97.7 F (36.5 C) (03/13 0508) Pulse Rate:  [54-66] 65 (03/13 0508) Resp:  [11-23] 16 (03/12 1541) BP: (111-175)/(70-90) 124/81 (03/13 0508) SpO2:  [96 %-100 %] 98 % (03/13 0508) Weight:  [79.2 kg-85.3 kg] 79.2 kg (03/13 0508)  Weight change:  Filed Weights   06/08/18 1020 06/08/18 1541 06/09/18 0508  Weight: 85.3 kg 79.2 kg 79.2 kg    Intake/Output: I/O last 3 completed shifts: In: 486.3 [P.O.:360; I.V.:126.3] Out: 2150 [Urine:2150]   Intake/Output this shift:  Total I/O In: -  Out: 200 [Urine:200] Awake alert oriented no distress CVS- RRR no murmurs  rubs or gallops RS- CTA no wheezes or rales ABD- BS present soft non-distended EXT- no edema No skin rashes itching   Basic Metabolic Panel: Recent Labs  Lab 06/08/18 1026 06/08/18 1703 06/09/18 0229  NA 138  --  138  K 5.4*  --  4.8  CL 112*  --  112*  CO2 15*  --  18*  GLUCOSE 103*  --  87  BUN 71*  --  72*  CREATININE 5.73*  --  5.47*  CALCIUM 9.1  --  8.5*  PHOS  --  5.6*  --     Liver Function Tests: Recent Labs  Lab 06/08/18 1026  AST 17  ALT 14  ALKPHOS 69  BILITOT 0.7  PROT 7.0  ALBUMIN 3.9   No results for input(s): LIPASE, AMYLASE in the last 168 hours. No results for input(s): AMMONIA in the last 168 hours.  CBC: Recent Labs  Lab 06/08/18 1026 06/09/18 0229  WBC 8.1 7.9  NEUTROABS 5.5  --   HGB 13.1 12.3*  HCT 41.6 38.1*  MCV 93.3 92.5  PLT 203 187    Cardiac Enzymes: Recent Labs  Lab 06/08/18 1026 06/08/18 1703 06/08/18 2252  TROPONINI <0.03 <0.03 <0.03    BNP: Invalid input(s): POCBNP  CBG: No results for input(s): GLUCAP in the last 168 hours.  Microbiology: No results found for this or any previous visit.  Coagulation Studies: Recent Labs    06/08/18 1026  LABPROT 14.0  INR 1.1  Urinalysis: Recent Labs    06/08/18 1406  COLORURINE STRAW*  LABSPEC 1.010  PHURINE 6.0  GLUCOSEU NEGATIVE  HGBUR MODERATE*  BILIRUBINUR NEGATIVE  KETONESUR NEGATIVE  PROTEINUR 100*  NITRITE NEGATIVE  LEUKOCYTESUR NEGATIVE      Imaging: US Renal  Result Date: 06/08/2018 CLINICAL DATA:  Chronic renal disease. EXAM: RENAL / URINARY TRACT ULTRASOUND COMPLETE COMPARISON:  None. FINDINGS: Right Kidney: Renal measurements: 9.6 x 5.7 x 5.1 cm = volume: 146 mL. Increased cortical echogenicity. No mass or hydronephrosis visualized. Left Kidney: Renal measurements: 9.3 x 5.1 x 4.3 = volume: 108 mL. Increased cortical echogenicity. No hydronephrosis visualized. 1.3 cm complicated cyst in the lower pole of the left kidney. Bladder: Appears  normal for degree of bladder distention. The prostate gland is mildly enlarged and somewhat irregular in contour measuring 3.5 x 3.1 x 5.1 cm for a volume of 29 cc. IMPRESSION: Bilateral echogenic kidneys, consistent with medical renal disease. Mildly complicated left renal cyst, attention on follow-up. Enlargement and contour irregularity of the prostate gland. Please correlate to serum PSA values. Electronically Signed   By: Fidela Salisbury M.D.   On: 06/08/2018 15:23   Dg Chest Port 1 View  Result Date: 06/08/2018 CLINICAL DATA:  Chest pain and shortness of breath EXAM: PORTABLE CHEST 1 VIEW COMPARISON:  None. FINDINGS: The heart size and mediastinal contours are within normal limits. Both lungs are clear. The visualized skeletal structures are unremarkable. IMPRESSION: No active disease. Electronically Signed   By: Inez Catalina M.D.   On: 06/08/2018 10:44     Medications:   . sodium chloride 20 mL/hr at 06/08/18 2003  . heparin 1,300 Units/hr (06/09/18 0529)   . aspirin  324 mg Oral Once  . aspirin  81 mg Oral Once  . atorvastatin  40 mg Oral q1800  . isosorbide mononitrate  30 mg Oral Daily  . nicotine  21 mg Transdermal Daily   acetaminophen **OR** acetaminophen  Assessment/ Plan:   CKD stage V.  Patient at high risk of contrast-induced nephropathy.  With already borderline renal function.  He may need dialysis after any administration of IV contrast.  Continue to avoid nonsteroidal anti-inflammatories drugs Cox 2 inhibitors ACE inhibitors or ARB's.  Medications renally adjusted.  Patient is interested in peritoneal dialysis obviously this will need to be initiated as an outpatient   hypertension/volume appears adequately controlled no evidence of volume overload  Chest pain for lexicon Myoview.  2D echo appreciate assistance from cardiology Dr. Martinique  Hyperlipidemia continue atorvastatin 40 mg daily  Hypokalemia appears to have resolved with 1 dose of Lokelma  Metabolic  acidosis we will start sodium bicarbonate 650 mg twice daily  Tobacco abuse continue nicotine patches  We will check thyroid function study.   LOS: [place] @TODAY @9 :35 AM

## 2018-06-09 NOTE — Progress Notes (Addendum)
   Subjective: [name] was seen briefly before his stress test this morning. He reports overnight he had one episode of substernal chest tightness. He continues to have this tightness this morning when he lays on his left side. He denies any pain radiating to his arm or jaw. States he is having a headache.   Objective:  Vital signs in last 24 hours: Vitals:   06/09/18 1050 06/09/18 1054 06/09/18 1057 06/09/18 1100  BP: 110/82 135/75 123/71 130/71  Pulse: 68 88 92 90  Resp:      Temp:      TempSrc:      SpO2:      Weight:      Height:       Physical Exam General: comfortably sitting in the chair, no distress CV: RRR, no murmurs   Assessment/Plan:  Active Problems:   Chest pain   Chronic kidney disease, stage V [facility])  Geoffrey Rangel is a 58 yo male with a history of untreated HTN, preveious DM, everyday tobacco user and CKD stage IV presenting with acute onset of substernal chest pain. He has a history of non-compliance to medications and was last seen by a physician 2 years ago when he was told he needed HD and declined.   Chest Pain: Patient presented with acute onset of stabbing substernal chest pain radiating to left arm with associated nausea and dry heaving. Repeat EKG continues to show peaked T waves in I/II/avF and some mild ST changes; no acute per cards. Negative troponin's. Plan is to proceed with a stress test and echocardiogram today. If normal, may not need cardiac cath. However, if abnormal he will need a cardiac cath. Nephro on board if need for dialysis arises.  - Cardiac monitoring  - Follow up myoview and echo  - Troponin's <0.03 - Aspirin 81 mg  - Cardiology on board, appreciate recommendations   Hyperkalemia: K 4.8 - Resolved with one dose of lokelma   HTN 124/81. He is not on any anti-hypertensives and has declined therapy in the past. - Telemetry - Continue to monitor - If he remains >485'I systolic we will start anti-hypertensive therapy   CKD  V: Patient declined HD and PD in the past. Last Cr in 2018 3.28. - Cr 5.47  - Per nephro, unclear etiology, question Alport's or other genetic kidney disease - Renal US showed bilateral echogenic kidneys, mildly complicated left renal cyst - Patient is amenable to HD if indicated  - Nephrology on board, appreciate recommendations   Enlarged Prostate - Renal US showed enlargement and contour irregularity of the prostate gland. Recommended to correlate to serum PSA values. May need outpatient follow up  Dispo: Anticipated discharge pending clinical improvement.    Geoffrey Rangel N, DO 06/09/2018, 12:11 PM Pager: [phone]

## 2018-06-09 NOTE — Progress Notes (Signed)
  Echocardiogram 2D Echocardiogram has been performed.  Geoffrey Rangel 06/09/2018, 4:48 PM

## 2018-06-10 DIAGNOSIS — Z720 Tobacco use: Secondary | ICD-10-CM

## 2018-06-10 DIAGNOSIS — N400 Enlarged prostate without lower urinary tract symptoms: Secondary | ICD-10-CM | POA: Diagnosis not present

## 2018-06-10 DIAGNOSIS — N185 Chronic kidney disease, stage 5: Secondary | ICD-10-CM | POA: Diagnosis not present

## 2018-06-10 DIAGNOSIS — Z79899 Other long term (current) drug therapy: Secondary | ICD-10-CM

## 2018-06-10 DIAGNOSIS — R079 Chest pain, unspecified: Secondary | ICD-10-CM | POA: Diagnosis not present

## 2018-06-10 DIAGNOSIS — I120 Hypertensive chronic kidney disease with stage 5 chronic kidney disease or end stage renal disease: Secondary | ICD-10-CM | POA: Diagnosis not present

## 2018-06-10 LAB — CBC
HCT: 38.6 % — ABNORMAL LOW (ref 39.0–52.0)
Hemoglobin: 12.2 g/dL — ABNORMAL LOW (ref 13.0–17.0)
MCH: 29.4 pg (ref 26.0–34.0)
MCHC: 31.6 g/dL (ref 30.0–36.0)
MCV: 93 fL (ref 80.0–100.0)
NRBC: 0 % (ref 0.0–0.2)
Platelets: 187 10*3/uL (ref 150–400)
RBC: 4.15 MIL/uL — AB (ref 4.22–5.81)
RDW: 13.6 % (ref 11.5–15.5)
WBC: 8.6 10*3/uL (ref 4.0–10.5)

## 2018-06-10 MED ORDER — ATORVASTATIN CALCIUM 40 MG PO TABS: 40.0000 mg | ORAL_TABLET | Freq: Every day | ORAL | 2 refills | Status: DC

## 2018-06-10 MED ORDER — ISOSORBIDE MONONITRATE ER 30 MG PO TB24: 30.0000 mg | ORAL_TABLET | Freq: Every day | ORAL | 2 refills | Status: DC

## 2018-06-10 MED ORDER — SODIUM BICARBONATE 650 MG PO TABS: 650.0000 mg | ORAL_TABLET | Freq: Two times a day (BID) | ORAL | 2 refills | Status: DC

## 2018-06-10 NOTE — Discharge Summary (Signed)
Name: Geoffrey Rangel MRN: [id number] DOB: Nov 11, 1960 58 y.o. PCP: Patient, No Pcp Per  Date of Admission: 06/08/2018 10:15 AM Date of Discharge: 06/10/2018 Attending Physician: Dr. Lalla Brothers  Discharge Diagnosis: 1. Chest Pain 2. CKD V  Discharge Medications: Allergies as of 06/10/2018   No Known Allergies     Medication List    TAKE these medications   atorvastatin 40 MG tablet Commonly known as:  LIPITOR Take 1 tablet (40 mg total) by mouth daily at 6 PM.   isosorbide mononitrate 30 MG 24 hr tablet Commonly known as:  IMDUR Take 1 tablet (30 mg total) by mouth daily.   sodium bicarbonate 650 MG tablet Take 1 tablet (650 mg total) by mouth 2 (two) times daily.       Disposition and follow-up:   Geoffrey Rangel was [facility] from [facility] in Stable condition.  At the hospital follow up visit please address:  1.  Chest Pain: Initial concern for possible STEMI but serial trops remained negative and chest pain resolved. Cath deferred due to progressive CKD. NM stress test without ischemia. Echocardiogram unremarkable with normal EF. Smoking cessation encouraged. Started on atorvastatin, lipid panel with LDL 134.   2. CKD V: Approaching ESRD. No urgent need for dialysis. Discharged with nephrology follow up. Started on bicarb per nephro.   3. HTN: Uncontrolled, started on Imdur. Please follow up.   2.  Labs / imaging needed at time of follow-up: BMP  3.  Pending labs/ test needing follow-up: None  Follow-up Appointments: Follow-up Information    Edrick Oh, MD Follow up in 1 week(s).   Specialty:  Nephrology Contact information: [address] [place] [address] [place]. Schedule an appointment as soon as possible for a visit.   Contact information: [address] [place] Hospital Course by problem list:  1. Chest Pain: Patient presented  with acute onset stabbing substernal chest pain radiating to his left arm with associated nausea and dry heaving. EKG was concerning for mild ST elevation in the lateral leads. Cardiology was consulted. He was started on heparin and nitro drip. Cath was delayed due to his CKD and he was monitored overnight on telemetry. Chest pain improved and serial troponins remained negative. He underwent a myoview stress test the following day which showed no areas of reversible ischemia. EF 45-50% on that study. TTE was done and estimated EF 37-90%, no diastolic dysfunction, and no wall motion abnormalities. Heparin and nitro were stopped and he had no recurrence of chest pain. He was started on atorvastatin for primary prevention, LDL 134.   2. CKD V: Patient had a history of CKD IV, lost to follow up for 2 years. He has not been taking any medications. Prior work up with kidney biopsy was concerning for alport's or other genetic kidney diease (please see duplicate chart, MRN [id number]). Patient was agreeable to starting dialysis if needed, but strongly preferred peritoneal dialysis. There was no indication for urgent dialysis, and he was discharged with nephrology follow up. Started on bicarb per nephro.   3. HTN: Blood pressure was elevated, [id number] sytolic during hospitalization. He was discharged on Imdur.   4. Prostate Enlargement & Irregularity: Noted on renal ultrasound, PSA checked was within normal range.   Discharge Vitals:   BP (!) 142/87    Pulse 63  Temp 98 F (36.7 C) (Oral)    Resp 16    Ht 6\' 1"  (1.854 m)    Wt 78.9 kg    SpO2 99%    BMI 22.94 kg/m   Pertinent Labs, Studies, and Procedures:   06/09/2018 NM Myoview Stress Test:   There was no ST segment deviation noted during stress.  The study is normal.  This is a low risk study.  The left ventricular ejection fraction is mildly decreased (45-54%).   This is a normal nuclear stress test with no evidence for a prior infarct or ischemia.      06/08/2018 Renal Ultrasound:  IMPRESSION: Bilateral echogenic kidneys, consistent with medical renal disease.  Mildly complicated left renal cyst, attention on follow-up.  Enlargement and contour irregularity of the prostate gland. Please correlate to serum PSA values.   Discharge Instructions: Discharge Instructions    Call MD for:  difficulty breathing, headache or visual disturbances   Complete by:  As directed    Call MD for:  extreme fatigue   Complete by:  As directed    Call MD for:  persistant dizziness or light-headedness   Complete by:  As directed    Call MD for:  persistant nausea and vomiting   Complete by:  As directed    Call MD for:  temperature >100.4   Complete by:  As directed    Diet - low sodium heart healthy   Complete by:  As directed    Discharge instructions   Complete by:  As directed    [name],  It was a pleasure to meet you. I am glad you are feeling better. Please call [facility] and schedule an appointment with Dr. Justin Mend in the next 1-2 weeks. You will also need to follow up with a primary care provider. I will set you up with a follow up appointment in our internal medicine clinic, which is on the basement floor of [facility] will be called to schedule this.   We have started you on three new medications for your heart and kidneys. Please take atorvastatin once a day (cholesterol medicine), Imdur (aka isosorbide mononitrate - blood pressure medication) once a day, and sodium bicarb twice a day for your kidneys.   If you have any questions or concerns, call our clinic at [phone] or after hours call [phone] and ask for the internal medicine resident on call. Thank you!  Dr. Philipp Ovens   Increase activity slowly   Complete by:  As directed       Signed: Velna Ochs, MD 06/12/2018, 1:15 PM   Pager: [phone]

## 2018-06-10 NOTE — Progress Notes (Signed)
[facility] ROUNDING NOTE   Subjective:   Patient is a very pleasant 58 year old gentleman with a positive family history of end-stage renal disease.  He has been evaluated and treated by Dr. Candiss Norse in [place] for the past year or 2.  He states that he has been considered a candidate for peritoneal dialysis and was scheduled to begin the process of training and placement of dialysis catheter.  His presentation creatinine was 5.7 with a GFR of less than 15 cc/min.  He continues to smoke 2 to 3 packs of cigarettes per day and has a history of hyperlipidemia.  He was brought to the hospital with chest pain and is scheduled to have a [employer] 06/09/2018.  Appreciate the assistance of Dr. Martinique.  Preliminary read nuclear stress test showed no ischemia ejection fraction 52%.  Heparin discontinued IV changed to subcu.  Sodium 138 potassium 4.8 chloride 112 CO2 18 BUN 72 creatinine 5.47 calcium 8.5 WBC 7.9 hemoglobin 12.3 platelets 187  Ultrasound reveals bilateral echogenic kidneys.  With an enlarged and irregular contour prostate gland  Medications aspirin 81 mg daily, atorvastatin 40 mg daily isosorbide 30 mg daily nicotine patches 21 mg, Lokelma x1 dose administered 06/08/2018, .  Blood pressure 124/81 pulse 65 temperature 97.7 O2 sats 98% room air  Urine output 1300 cc 06/08/2028  WBC 8.6 hemoglobin 12.2 platelets 187 TSH 1.87     Objective:  Vital signs in last 24 hours:  Temp:  [97.8 F (36.6 C)-98.5 F (36.9 C)] 98 F (36.7 C) (03/14 0556) Pulse Rate:  [63-77] 63 (03/14 0556) Resp:  [16] 16 (03/14 0556) BP: (115-142)/(74-87) 142/87 (03/14 0815) SpO2:  [98 %-99 %] 99 % (03/14 0556) Weight:  [78.9 kg] 78.9 kg (03/14 0556)  Weight change: -6.396 kg Filed Weights   06/08/18 1541 06/09/18 0508 06/10/18 0556  Weight: 79.2 kg 79.2 kg 78.9 kg    Intake/Output: I/O last 3 completed shifts: In: 626.3 [P.O.:600; I.V.:26.3] Out: 2400 [Urine:2400]    Intake/Output this shift:  Total I/O In: 240 [P.O.:240] Out: -  Awake alert oriented no distress CVS- RRR no murmurs rubs or gallops RS- CTA no wheezes or rales ABD- BS present soft non-distended EXT- no edema No skin rashes itching   Basic Metabolic Panel: Recent Labs  Lab 06/08/18 1026 06/08/18 1703 06/09/18 0229  NA 138  --  138  K 5.4*  --  4.8  CL 112*  --  112*  CO2 15*  --  18*  GLUCOSE 103*  --  87  BUN 71*  --  72*  CREATININE 5.73*  --  5.47*  CALCIUM 9.1  --  8.5*  PHOS  --  5.6*  --     Liver Function Tests: Recent Labs  Lab 06/08/18 1026  AST 17  ALT 14  ALKPHOS 69  BILITOT 0.7  PROT 7.0  ALBUMIN 3.9   No results for input(s): LIPASE, AMYLASE in the last 168 hours. No results for input(s): AMMONIA in the last 168 hours.  CBC: Recent Labs  Lab 06/08/18 1026 06/09/18 0229 06/10/18 0242  WBC 8.1 7.9 8.6  NEUTROABS 5.5  --   --   HGB 13.1 12.3* 12.2*  HCT 41.6 38.1* 38.6*  MCV 93.3 92.5 93.0  PLT 203 187 187    Cardiac Enzymes: Recent Labs  Lab 06/08/18 1026 06/08/18 1703 06/08/18 2252  TROPONINI <0.03 <0.03 <0.03    BNP: Invalid input(s): POCBNP  CBG: No results for input(s): GLUCAP in the last  168 hours.  Microbiology: No results found for this or any previous visit.  Coagulation Studies: Recent Labs    06/08/18 1026  LABPROT 14.0  INR 1.1    Urinalysis: Recent Labs    06/08/18 1406  COLORURINE STRAW*  LABSPEC 1.010  PHURINE 6.0  GLUCOSEU NEGATIVE  HGBUR MODERATE*  BILIRUBINUR NEGATIVE  KETONESUR NEGATIVE  PROTEINUR 100*  NITRITE NEGATIVE  LEUKOCYTESUR NEGATIVE      Imaging: US Renal  Result Date: 06/08/2018 CLINICAL DATA:  Chronic renal disease. EXAM: RENAL / URINARY TRACT ULTRASOUND COMPLETE COMPARISON:  None. FINDINGS: Right Kidney: Renal measurements: 9.6 x 5.7 x 5.1 cm = volume: 146 mL. Increased cortical echogenicity. No mass or hydronephrosis visualized. Left Kidney: Renal measurements: 9.3 x  5.1 x 4.3 = volume: 108 mL. Increased cortical echogenicity. No hydronephrosis visualized. 1.3 cm complicated cyst in the lower pole of the left kidney. Bladder: Appears normal for degree of bladder distention. The prostate gland is mildly enlarged and somewhat irregular in contour measuring 3.5 x 3.1 x 5.1 cm for a volume of 29 cc. IMPRESSION: Bilateral echogenic kidneys, consistent with medical renal disease. Mildly complicated left renal cyst, attention on follow-up. Enlargement and contour irregularity of the prostate gland. Please correlate to serum PSA values. Electronically Signed   By: Fidela Salisbury M.D.   On: 06/08/2018 15:23   Nm Myocar Multi W/spect W/wall Motion / Ef  Result Date: 06/09/2018  There was no ST segment deviation noted during stress.  The study is normal.  This is a low risk study.  The left ventricular ejection fraction is mildly decreased (45-54%).  This is a normal nuclear stress test with no evidence for a prior infarct or ischemia.     Medications:   . sodium chloride 20 mL/hr at 06/08/18 2003   . aspirin  324 mg Oral Once  . atorvastatin  40 mg Oral q1800  . heparin injection (subcutaneous)  5,000 Units Subcutaneous Q8H  . isosorbide mononitrate  30 mg Oral Daily  . nicotine  21 mg Transdermal Daily  . sodium bicarbonate  650 mg Oral BID   acetaminophen **OR** acetaminophen  Assessment/ Plan:   CKD stage V.  Patient at high risk of contrast-induced nephropathy.  With already borderline renal function.  He may need dialysis after any administration of IV contrast.  Continue to avoid nonsteroidal anti-inflammatories drugs Cox 2 inhibitors ACE inhibitors or ARB's.  Medications renally adjusted.  Patient is interested in peritoneal dialysis obviously this will need to be initiated as an outpatient have discussed with home training they will make contact with patient patient will need appointment to [facility]  hypertension/volume appears  adequately controlled no evidence of volume overload  Chest pain for lexicon Myoview.  2D echo appreciate assistance from cardiology Dr. Martinique  Hyperlipidemia continue atorvastatin 40 mg daily  Hypokalemia appears to have resolved with 1 dose of Lokelma  Metabolic acidosis we will start sodium bicarbonate 650 mg twice daily  Tobacco abuse continue nicotine patches  TSH normal  Patient has an abnormal appearance of prostate gland.  Will check PSA.  Please schedule appointment with Dr. Justin Mend at [facility].  [phone].  Appointment should be within the next 1 to 2 weeks   LOS: [place] @TODAY @11 :18 AM

## 2018-06-10 NOTE — Progress Notes (Signed)
Pt stable, DC home with wife via wheelchair.

## 2018-06-10 NOTE — Discharge Instructions (Signed)
Geoffrey Rangel What is this medicine? Geoffrey (a TORE va sta tin) is known as a HMG-CoA reductase inhibitor or 'statin'. It lowers the level of cholesterol and triglycerides in the blood. This drug may also reduce the risk of heart attack, stroke, or other health problems in patients with risk factors for heart disease. Diet and lifestyle changes are often used with this drug. This medicine may be used for other purposes; ask your health care provider or pharmacist if you have questions. COMMON BRAND NAME(S): Lipitor What should I tell my health care provider before I take this medicine? They need to know if you have any of these conditions: -diabetes -if you often drink alcohol -history of stroke -kidney disease -liver disease -muscle aches or weakness -thyroid disease -an unusual or allergic reaction to Geoffrey, other medicines, foods, dyes, or preservatives -pregnant or trying to get pregnant -breast-feeding How should I use this medicine? Take this medicine by mouth with a glass of water. Follow the directions on the prescription label. You can take it with or without food. If it upsets your stomach, take it with food. Geoffrey Rangel not take with grapefruit juice. Take your medicine at regular intervals. Geoffrey Rangel not take it more often than directed. Geoffrey Rangel not stop taking except on your doctor's advice. Talk to your pediatrician regarding the use of this medicine in children. While this drug may be prescribed for children as young as 10 for selected conditions, precautions Geoffrey Rangel apply. Overdosage: If you think you have taken too much of this medicine contact a poison control center or emergency room at once. NOTE: This medicine is only for you. Geoffrey Rangel not share this medicine with others. What if I miss a dose? If you miss a dose, take it as soon as you can. If your next dose is to be taken in less than 12 hours, then Geoffrey Rangel not take the missed dose. Take the next dose at your regular time. Geoffrey Rangel not take  double or extra doses. What may interact with this medicine? Geoffrey Rangel not take this medicine with any of the following medications: -dasabuvir; ombitasvir; paritaprevir; ritonavir -ombitasvir; paritaprevir; ritonavir -posaconazole -red yeast rice This medicine may also interact with the following medications: -alcohol -birth control pills -certain antibiotics like erythromycin and clarithromycin -certain antivirals for HIV or hepatitis -certain medicines for cholesterol like fenofibrate, gemfibrozil, and niacin -certain medicines for fungal infections like ketoconazole and itraconazole -colchicine -cyclosporine -digoxin -grapefruit juice -rifampin This list may not describe all possible interactions. Give your health care provider a list of all the medicines, herbs, non-prescription drugs, or dietary supplements you use. Also tell them if you smoke, drink alcohol, or use illegal drugs. Some items may interact with your medicine. What should I watch for while using this medicine? Visit your doctor or health care professional for regular check-ups. You may need regular tests to make sure your liver is working properly. Your health care professional may tell you to stop taking this medicine if you develop muscle problems. If your muscle problems Geoffrey Rangel not go away after stopping this medicine, contact your health care professional. Geoffrey Rangel not become pregnant while taking this medicine. Women should inform their health care professional if they wish to become pregnant or think they might be pregnant. There is a potential for serious side effects to an unborn child. Talk to your health care professional or pharmacist for more information. Geoffrey Rangel not breast-feed an infant while taking this medicine. This medicine may affect blood sugar levels. If you have diabetes, check  with your doctor or health care professional before you change your diet or the dose of your diabetic medicine. If you are going to need surgery  or other procedure, tell your doctor that you are using this medicine. This drug is only part of a total heart-health program. Your doctor or a dietician can suggest a low-cholesterol and low-fat diet to help. Avoid alcohol and smoking, and keep a proper exercise schedule. This medicine may cause a decrease in Co-Enzyme Q-10. You should make sure that you get enough Co-Enzyme Q-10 while you are taking this medicine. Discuss the foods you eat and the vitamins you take with your health care professional. What side effects may I notice from receiving this medicine? Side effects that you should report to your doctor or health care professional as soon as possible: -allergic reactions like skin rash, itching or hives, swelling of the face, lips, or tongue -fever -joint pain -loss of memory -redness, blistering, peeling or loosening of the skin, including inside the mouth -signs and symptoms of liver injury like dark yellow or brown urine; general ill feeling or flu-like symptoms; light-belly pain; unusually weak or tired; yellowing of the eyes or skin -signs and symptoms of muscle injury like dark urine; trouble passing urine or change in the amount of urine; unusually weak or tired; muscle pain or side or back pain Side effects that usually Geoffrey Rangel not require medical attention (report to your doctor or health care professional if they continue or are bothersome): -diarrhea -nausea -stomach pain -trouble sleeping -upset stomach This list may not describe all possible side effects. Call your doctor for medical advice about side effects. You may report side effects to FDA at 1-800-FDA-1088. Where should I keep my medicine? Keep out of the reach of children. Store between 20 and 25 degrees C (68 and 77 degrees F). Throw away any unused medicine after the expiration date. NOTE: This sheet is a summary. It may not cover all possible information. If you have questions about this medicine, talk to your doctor,  pharmacist, or health care provider.  2019 Elsevier/Gold Standard (2017-07-15 11:36:48) Isosorbide Mononitrate extended-release Rangel What is this medicine? ISOSORBIDE MONONITRATE (eye soe SOR bide mon oh NYE trate) is a vasodilator. It relaxes blood vessels, increasing the blood and oxygen supply to your heart. This medicine is used to prevent chest pain caused by angina. It will not help to stop an episode of chest pain. This medicine may be used for other purposes; ask your health care provider or pharmacist if you have questions. COMMON BRAND NAME(S): Imdur, Isotrate ER What should I tell my health care provider before I take this medicine? They need to know if you have any of these conditions: -previous heart attack or heart failure -an unusual or allergic reaction to isosorbide mononitrate, nitrates, other medicines, foods, dyes, or preservatives -pregnant or trying to get pregnant -breast-feeding How should I use this medicine? Take this medicine by mouth with a glass of water. Follow the directions on the prescription label. Geoffrey Rangel not crush or chew. Take your medicine at regular intervals. Geoffrey Rangel not take your medicine more often than directed. Geoffrey Rangel not stop taking this medicine except on the advice of your doctor or health care professional. Talk to your pediatrician regarding the use of this medicine in children. Special care may be needed. Overdosage: If you think you have taken too much of this medicine contact a poison control center or emergency room at once. NOTE: This medicine is only for you.  Geoffrey Rangel not share this medicine with others. What if I miss a dose? If you miss a dose, take it as soon as you can. If it is almost time for your next dose, take only that dose. Dakiyah Heinke not take double or extra doses. What may interact with this medicine? Chanese Hartsough not take this medicine with any of the following medications: -medicines used to treat erectile dysfunction (ED) like avanafil, sildenafil,  tadalafil, and vardenafil -riociguat This medicine may also interact with the following medications: -medicines for high blood pressure -other medicines for angina or heart failure This list may not describe all possible interactions. Give your health care provider a list of all the medicines, herbs, non-prescription drugs, or dietary supplements you use. Also tell them if you smoke, drink alcohol, or use illegal drugs. Some items may interact with your medicine. What should I watch for while using this medicine? Check your heart rate and blood pressure regularly while you are taking this medicine. Ask your doctor or health care professional what your heart rate and blood pressure should be and when you should contact him or her. Tell your doctor or health care professional if you feel your medicine is no longer working. You may get dizzy. Georgianna Band not drive, use machinery, or Lisamarie Coke anything that needs mental alertness until you know how this medicine affects you. To reduce the risk of dizzy or fainting spells, Shiloh Southern not sit or stand up quickly, especially if you are an older patient. Alcohol can make you more dizzy, and increase flushing and rapid heartbeats. Avoid alcoholic drinks. Alen Matheson not treat yourself for coughs, colds, or pain while you are taking this medicine without asking your doctor or health care professional for advice. Some ingredients may increase your blood pressure. What side effects may I notice from receiving this medicine? Side effects that you should report to your doctor or health care professional as soon as possible: -bluish discoloration of lips, fingernails, or palms of hands -irregular heartbeat, palpitations -low blood pressure -nausea, vomiting -persistent headache -unusually weak or tired Side effects that usually Marieli Rudy not require medical attention (report to your doctor or health care professional if they continue or are bothersome): -flushing of the face or neck -rash This list  may not describe all possible side effects. Call your doctor for medical advice about side effects. You may report side effects to FDA at 1-800-FDA-1088. Where should I keep my medicine? Keep out of the reach of children. Store between 15 and 30 degrees C (59 and 86 degrees F). Keep container tightly closed. Throw away any unused medicine after the expiration date. NOTE: This sheet is a summary. It may not cover all possible information. If you have questions about this medicine, talk to your doctor, pharmacist, or health care provider.  2019 Elsevier/Gold Standard (2013-01-12 14:48:19)

## 2018-06-10 NOTE — Progress Notes (Signed)
   Subjective: Feels well this morning, no further episodes of chest pain. Ready for discharge. Discussed the importance of following up with nephrology outpatient for future dialysis needs.   Objective:  Vital signs in last 24 hours: Vitals:   06/09/18 1100 06/09/18 1416 06/09/18 2044 06/10/18 0556  BP: 130/71 122/80 115/76 132/74  Pulse: 90 77 67 63  Resp:   16 16  Temp:  97.8 F (36.6 C) 98.5 F (36.9 C) 98 F (36.7 C)  TempSrc:  Oral Oral Oral  SpO2:  98% 99% 99%  Weight:    78.9 kg  Height:       Physical Exam Constitutional: NAD, appears comfortable Cardiovascular: RRR, no murmurs, rubs, or gallops.  Pulmonary/Chest: CTAB, no wheezes, rales, or rhonchi.  Extremities: Warm and well perfused. No edema.  Psychiatric: Normal mood and affect   Assessment/Plan:  Principal Problem:   Chest pain Active Problems:   Chronic kidney disease, stage V (HCC)   Prostate enlargement  Chest Pain: Initial EKG with mild ST elevations / ST changes but serial trops remained negative. Cath was deferred due to his CKD V. Underwent myoview stress test yesterday without ischemia, EF mildly reduced 45-54%. Echocardiogram yesterday showed LVEF of 16-10%, normal diastolic pressures, no wall motion abnormalities. Cardiology has signed off.  -- [place] cardiology consultation  -- Heparin stopped -- ASA 81 mg daily  -- Telemetry  CKD V: Nephrology consulted. Unclear etiology; prior biopsy with concern for alport's or other genetic kidney disease.  -- Continue to follow -- Avoid nephrotoxic agents  -- Avoid contrast if possible; doubt he will need a cath at this point  -- Will need close outpatient follow up    HTN: Not on medications prior to admission  -- Started on Imdur, will continue at discharge per cardiology recs  Enlarged Prostate: With irregularity noted on renal ultrasound.  -- Check PSA   Dispo: Anticipated discharge today.   Velna Ochs, MD 06/10/2018, 10:40 AM  Pager: [phone]

## 2018-06-10 NOTE — Progress Notes (Signed)
[facility] will sign off.   Medication Recommendations: Continue current meds including Imdur. Other recommendations (labs, testing, etc):  None Follow up as an outpatient:  With PCP.

## 2018-06-12 LAB — PSA, SERUM (SERIAL MONITOR): Prostate Specific Ag, Serum: 1.1 ng/mL (ref 0.0–4.0)

## 2018-06-19 ENCOUNTER — Encounter: Payer: Self-pay | Admitting: *Deleted

## 2018-06-19 ENCOUNTER — Ambulatory Visit
Admission: RE | Admit: 2018-06-19 | Discharge: 2018-06-19 | Disposition: A | Discharge status: Home / Self Care | Payer: 59 | Attending: Vascular Surgery | Admitting: Vascular Surgery

## 2018-06-19 ENCOUNTER — Other Ambulatory Visit: Payer: Self-pay

## 2018-06-19 ENCOUNTER — Other Ambulatory Visit (INDEPENDENT_AMBULATORY_CARE_PROVIDER_SITE_OTHER): Payer: Self-pay | Admitting: Vascular Surgery

## 2018-06-19 ENCOUNTER — Encounter
Admission: RE | Disposition: A | Discharge status: Home / Self Care | Payer: Self-pay | Source: Home / Self Care | Attending: Vascular Surgery

## 2018-06-19 DIAGNOSIS — N189 Chronic kidney disease, unspecified: Secondary | ICD-10-CM

## 2018-06-19 DIAGNOSIS — Z452 Encounter for adjustment and management of vascular access device: Secondary | ICD-10-CM | POA: Diagnosis not present

## 2018-06-19 DIAGNOSIS — N186 End stage renal disease: Secondary | ICD-10-CM | POA: Insufficient documentation

## 2018-06-19 HISTORY — PX: DIALYSIS/PERMA CATHETER INSERTION: CATH118288

## 2018-06-19 HISTORY — DX: Hyperlipidemia, unspecified: E78.5

## 2018-06-19 SURGERY — DIALYSIS/PERMA CATHETER INSERTION
Anesthesia: Moderate Sedation

## 2018-06-19 MED ORDER — SODIUM CHLORIDE 0.9 % IV SOLN
INTRAVENOUS | Status: DC
  Administered 2018-06-19: 13:00:00 via INTRAVENOUS

## 2018-06-19 MED ORDER — FENTANYL CITRATE (PF) 100 MCG/2ML IJ SOLN
INTRAMUSCULAR | Status: AC
  Filled 2018-06-19: qty 2

## 2018-06-19 MED ORDER — FENTANYL CITRATE (PF) 100 MCG/2ML IJ SOLN
INTRAMUSCULAR | Status: DC | PRN
  Administered 2018-06-19: 50 ug via INTRAVENOUS

## 2018-06-19 MED ORDER — LIDOCAINE-EPINEPHRINE (PF) 1 %-1:200000 IJ SOLN
INTRAMUSCULAR | Status: AC
  Filled 2018-06-19: qty 30

## 2018-06-19 MED ORDER — MIDAZOLAM HCL 2 MG/2ML IJ SOLN
INTRAMUSCULAR | Status: DC | PRN
  Administered 2018-06-19: 1 mg via INTRAVENOUS
  Administered 2018-06-19: 2 mg via INTRAVENOUS

## 2018-06-19 MED ORDER — FAMOTIDINE 20 MG PO TABS: 40.0000 mg | ORAL_TABLET | Freq: Once | ORAL | Status: DC | PRN

## 2018-06-19 MED ORDER — ONDANSETRON HCL 4 MG/2ML IJ SOLN: 4.0000 mg | Freq: Four times a day (QID) | INTRAMUSCULAR | Status: DC | PRN

## 2018-06-19 MED ORDER — CEFAZOLIN SODIUM-DEXTROSE 1-4 GM/50ML-% IV SOLN
INTRAVENOUS | Status: AC
  Filled 2018-06-19: qty 50

## 2018-06-19 MED ORDER — CEFAZOLIN SODIUM-DEXTROSE 1-4 GM/50ML-% IV SOLN
1.0000 g | Freq: Once | INTRAVENOUS | Status: AC
  Administered 2018-06-19: 1 g via INTRAVENOUS

## 2018-06-19 MED ORDER — MIDAZOLAM HCL 2 MG/ML PO SYRP: 8.0000 mg | ORAL_SOLUTION | Freq: Once | ORAL | Status: DC | PRN

## 2018-06-19 MED ORDER — DIPHENHYDRAMINE HCL 50 MG/ML IJ SOLN: 50.0000 mg | Freq: Once | INTRAMUSCULAR | Status: DC | PRN

## 2018-06-19 MED ORDER — HEPARIN (PORCINE) IN NACL 1000-0.9 UT/500ML-% IV SOLN
INTRAVENOUS | Status: AC
  Filled 2018-06-19: qty 500

## 2018-06-19 MED ORDER — MIDAZOLAM HCL 5 MG/5ML IJ SOLN
INTRAMUSCULAR | Status: AC
  Filled 2018-06-19: qty 5

## 2018-06-19 MED ORDER — METHYLPREDNISOLONE SODIUM SUCC 125 MG IJ SOLR: 125.0000 mg | Freq: Once | INTRAMUSCULAR | Status: DC | PRN

## 2018-06-19 MED ORDER — HEPARIN SODIUM (PORCINE) 10000 UNIT/ML IJ SOLN
INTRAMUSCULAR | Status: AC
  Filled 2018-06-19: qty 1

## 2018-06-19 MED ORDER — HYDROMORPHONE HCL 1 MG/ML IJ SOLN: 1.0000 mg | Freq: Once | INTRAMUSCULAR | Status: DC | PRN

## 2018-06-19 SURGICAL SUPPLY — 7 items
BIOPATCH RED 1 DISK 7.0 (GAUZE/BANDAGES/DRESSINGS) ×2 IMPLANT
BIOPATCH RED 1IN DISK 7.0MM (GAUZE/BANDAGES/DRESSINGS) ×1
CATH PALINDROME RT-P 15FX19CM (CATHETERS) ×3 IMPLANT
DERMABOND ADVANCED (GAUZE/BANDAGES/DRESSINGS) ×2
DERMABOND ADVANCED .7 DNX12 (GAUZE/BANDAGES/DRESSINGS) ×1 IMPLANT
PACK ANGIOGRAPHY (CUSTOM PROCEDURE TRAY) ×3 IMPLANT
SUT MNCRL AB 4-0 PS2 18 (SUTURE) ×3 IMPLANT

## 2018-06-19 NOTE — Discharge Instructions (Signed)
Moderate Conscious Sedation, Adult, Care After These instructions provide you with information about caring for yourself after your procedure. Your health care provider may also give you more specific instructions. Your treatment has been planned according to current medical practices, but problems sometimes occur. Call your health care provider if you have any problems or questions after your procedure. What can I expect after the procedure? After your procedure, it is common:  To feel sleepy for several hours.  To feel clumsy and have poor balance for several hours.  To have poor judgment for several hours.  To vomit if you eat too soon. Follow these instructions at home: For at least 24 hours after the procedure:   Do not: ? Participate in activities where you could fall or become injured. ? Drive. ? Use heavy machinery. ? Drink alcohol. ? Take sleeping pills or medicines that cause drowsiness. ? Make important decisions or sign legal documents. ? Take care of children on your own.  Rest. Eating and drinking  Follow the diet recommended by your health care provider.  If you vomit: ? Drink water, juice, or soup when you can drink without vomiting. ? Make sure you have little or no nausea before eating solid foods. General instructions  Have a responsible adult stay with you until you are awake and alert.  Take over-the-counter and prescription medicines only as told by your health care provider.  If you smoke, do not smoke without supervision.  Keep all follow-up visits as told by your health care provider. This is important. Contact a health care provider if:  You keep feeling nauseous or you keep vomiting.  You feel light-headed.  You develop a rash.  You have a fever. Get help right away if:  You have trouble breathing. This information is not intended to replace advice given to you by your health care provider. Make sure you discuss any questions you have  with your health care provider. Document Released: 01/03/2013 Document Revised: 08/18/2015 Document Reviewed: 07/05/2015 Elsevier Interactive Patient Education  2019 [place] Catheter Insertion, Care After Refer to this sheet in the next few weeks. These instructions provide you with information about caring for yourself after your procedure. Your health care provider may also give you more specific instructions. Your treatment has been planned according to current medical practices, but problems sometimes occur. Call your health care provider if you have any problems or questions after your procedure. What can I expect after the procedure? After the procedure, it is common to have:  Some mild redness, swelling, and pain around your catheter site.  A small amount of blood or clear fluid coming from your incisions. Follow these instructions at home: Incision care  Check your incision areas every day for signs of infection. Check for: ? More redness, swelling, or pain. ? More fluid or blood. ? Warmth. ? Pus or a bad smell.  Follow instructions from your health care provider about how to take care of your incisions. Make sure you: ? Wash your hands with soap and water before you change your bandages (dressings). If soap and water are not available, use hand sanitizer. ? Change your dressings as told by your health care provider. Wash the area around your incisions with a germ-killing (antiseptic) solution when you change your dressing, as told by your health care provider. ? Leave stitches (sutures), skin glue, or adhesive strips in place. These skin closures may need to stay in place for 2 weeks or longer. If  adhesive strip edges start to loosen and curl up, you may trim the loose edges. Do not remove adhesive strips completely unless your health care provider tells you to do that. Catheter Care   Wash your hands with soap and water before and after caring for your catheter.  If soap and water are not available, use hand sanitizer.  Keep your catheter site and your dressings clean and dry.  Apply an antibiotic ointment to your catheter site as told by your health care provider.  Flush your catheter as told by your health care provider. This helps prevent it from becoming clogged.  Do not open the caps on the ends of the catheter.  Do not pull on your catheter.  If your catheter is in your arm: ? Avoid wearing tight clothes or tight jewelry on your arm that has the catheter. ? Do not sleep with your head on the arm that has the catheter. ? Do not allow your blood pressure to be taken on the arm that has the catheter. ? Do not allow your blood to be drawn from the arm that has the catheter, except through the catheter itself. Medicines  Take over-the-counter and prescription medicines only as told by your health care provider.  If you were prescribed an antibiotic medicine, take it as told by your health care provider. Do not stop taking the antibiotic even if you start to feel better. Activity  Return to your normal activities as told by your health care provider. Ask your health care provider what activities are safe for you.  Do not lift anything that is heavier than 10 lb (4.5 kg) for 3 weeks or as long as told by your health care provider. Driving  Do not drive until your health care provider approves.  Do not drive or operate heavy machinery while taking prescription pain medicine. General instructions  Follow your health care provider's specific instructions for the type of catheter that you have.  Do not take baths, swim, or use a hot tub until your health care provider approves.  Follow instructions from your health care provider about eating or drinking restrictions.  Wear compression stockings as told by your health care provider. These stockings help to prevent blood clots and reduce swelling in your legs.  Keep all follow-up visits as  told by your health care provider. This is important. Contact a health care provider if:  You have more fluid or blood coming from your incisions.  You have more redness, swelling, or pain at your incisions or around the area where your catheter is inserted.  Your incisions feel warm to the touch.  You feel unusually weak.  You feel nauseous.  Your catheter is not working properly.  You have blood or fluid draining from your catheter.  You are unable to flush your catheter. Get help right away if:  Your catheter breaks.  A hole develops in your catheter.  Your catheter comes loose or gets pulled completely out. If this happens, press on your catheter site firmly with your hand or a clean cloth until you get medical help.  Your catheter becomes blocked.  You have swelling in your arm, shoulder, neck, or face.  You develop chest pain.  You have difficulty breathing.  You feel dizzy or light-headed.  You have pus or a bad smell coming from your incisions.  You have a fever.  You develop bleeding from your catheter or your insertion site, and your bleeding does not stop. This  information is not intended to replace advice given to you by your health care provider. Make sure you discuss any questions you have with your health care provider. Document Released: 03/01/2012 Document Revised: 11/16/2015 Document Reviewed: 12/09/2014 Elsevier Interactive Patient Education  [employer].

## 2018-06-19 NOTE — H&P (Signed)
[facility] History & Physical Update  The patient was interviewed and re-examined.  The patient's previous History and Physical has been reviewed and is unchanged.  There is no change in the plan of care. We plan to proceed with the scheduled procedure.  Leotis Pain, MD  06/19/2018, 11:44 AM

## 2018-06-19 NOTE — Op Note (Signed)
OPERATIVE NOTE    PRE-OPERATIVE DIAGNOSIS: 1. CKD now progressed to ESRD needing dialysis   POST-OPERATIVE DIAGNOSIS: same as above  PROCEDURE: 1. Ultrasound guidance for vascular access to the right internal jugular vein 2. Fluoroscopic guidance for placement of catheter 3. Placement of a 19 cm tip to cuff tunneled hemodialysis catheter via the right internal jugular vein  SURGEON: Leotis Pain, MD  ANESTHESIA:  Local with Moderate conscious sedation for approximately 15 minutes using 3 mg of Versed and 50 mcg of Fentanyl  ESTIMATED BLOOD LOSS: 5 cc  FLUORO TIME: less than one minute  CONTRAST: none  FINDING(S): 1.  Patent right internal jugular vein  SPECIMEN(S):  None  INDICATIONS:   Geoffrey Rangel is a 59 y.o.male who presents with progression of renal failure now requiring dialysis.  The patient needs long term dialysis access for their ESRD, and a Permcath is necessary.  Risks and benefits are discussed and informed consent is obtained.    DESCRIPTION: After obtaining full informed written consent, the patient was brought back to the vascular suited. The patient's right neck and chest were sterilely prepped and draped in a sterile surgical field was created. Moderate conscious sedation was administered during a face to face encounter with the patient throughout the procedure with my supervision of the RN administering medicines and monitoring the patient's vital signs, pulse oximetry, telemetry and mental status throughout from the start of the procedure until the patient was taken to the recovery room.  The right internal jugular vein was visualized with ultrasound and found to be patent. It was then accessed under direct ultrasound guidance and a permanent image was recorded. A wire was placed. After skin nick and dilatation, the peel-away sheath was placed over the wire. I then turned my attention to an area under the clavicle. Approximately 1-2 fingerbreadths below the clavicle  a small counterincision was created and tunneled from the subclavicular incision to the access site. Using fluoroscopic guidance, a 19 centimeter tip to cuff tunneled hemodialysis catheter was selected, and tunneled from the subclavicular incision to the access site. It was then placed through the peel-away sheath and the peel-away sheath was removed. Using fluoroscopic guidance the catheter tips were parked in the right atrium. The appropriate distal connectors were placed. It withdrew blood well and flushed easily with heparinized saline and a concentrated heparin solution was then placed. It was secured to the chest wall with 2 Prolene sutures. The access incision was closed single 4-0 Monocryl. A 4-0 Monocryl pursestring suture was placed around the exit site. Sterile dressings were placed. The patient tolerated the procedure well and was taken to the recovery room in stable condition.  COMPLICATIONS: None  CONDITION: Stable  Leotis Pain, MD 06/19/2018 1:23 PM   This note was created with Dragon Medical transcription system. Any errors in dictation are purely unintentional.

## 2018-06-22 ENCOUNTER — Emergency Department
Admission: EM | Admit: 2018-06-22 | Discharge: 2018-06-22 | Disposition: A | Discharge status: Home / Self Care | Payer: 59 | Attending: Emergency Medicine | Admitting: Emergency Medicine

## 2018-06-22 ENCOUNTER — Other Ambulatory Visit: Payer: Self-pay

## 2018-06-22 DIAGNOSIS — T82838A Hemorrhage of vascular prosthetic devices, implants and grafts, initial encounter: Secondary | ICD-10-CM | POA: Diagnosis not present

## 2018-06-22 DIAGNOSIS — Y658 Other specified misadventures during surgical and medical care: Secondary | ICD-10-CM | POA: Insufficient documentation

## 2018-06-22 DIAGNOSIS — N185 Chronic kidney disease, stage 5: Secondary | ICD-10-CM | POA: Insufficient documentation

## 2018-06-22 DIAGNOSIS — F17210 Nicotine dependence, cigarettes, uncomplicated: Secondary | ICD-10-CM | POA: Insufficient documentation

## 2018-06-22 DIAGNOSIS — Z09 Encounter for follow-up examination after completed treatment for conditions other than malignant neoplasm: Secondary | ICD-10-CM

## 2018-06-22 DIAGNOSIS — Z79899 Other long term (current) drug therapy: Secondary | ICD-10-CM | POA: Diagnosis not present

## 2018-06-22 DIAGNOSIS — I120 Hypertensive chronic kidney disease with stage 5 chronic kidney disease or end stage renal disease: Secondary | ICD-10-CM | POA: Insufficient documentation

## 2018-06-22 NOTE — ED Notes (Addendum)
Pt states that he saw some blood behind permacath and wanted to come in and make sure there weren't any complications. Small amt of dried clots behind bio-patch cleaned by Lorriane Shire, RN. Bio-patch replaced using sterile procedure and sterile tegaderm placed.

## 2018-06-22 NOTE — ED Provider Notes (Signed)
[facility] Emergency Department Provider Note   ____________________________________________   First MD Initiated Contact with Patient 06/22/18 [phone]     (approximate)  I have reviewed the triage vital signs and the nursing notes.   HISTORY  Chief Complaint Post-op Problem    HPI Geoffrey Rangel is a 58 y.o. male who presents to the ED from home with a chief complaint of postoperative bleeding.  Patient had right-sided permacath placed on 06/19/2018.  Went to bed fine and awoke with bleeding on the dressing.  Thinks he accidentally slept on his right side and may have disrupted a suture.  Denies feeling lightheaded, weak or dizzy.  Denies associated fever, chills, chest pain, shortness of breath, abdominal pain, nausea or vomiting.       Past Medical History:  Diagnosis Date  . Arthritis   . Chronic kidney disease   . Dyspnea    occasional  . GERD (gastroesophageal reflux disease)   . Hyperlipidemia   . Hypertension   . Renal disorder     Patient Active Problem List   Diagnosis Date Noted  . Chronic kidney disease, stage V ([place]) 06/09/2018  . Prostate enlargement 06/09/2018  . Chest pain 06/08/2018  . Proteinuria 09/17/2016    Past Surgical History:  Procedure Laterality Date  . DIALYSIS/PERMA CATHETER INSERTION N/A 06/19/2018   Procedure: DIALYSIS/PERMA CATHETER INSERTION;  Surgeon: Algernon Huxley, MD;  Location: [place] [facility];  Service: Cardiovascular;  Laterality: N/A;  . RENAL BIOPSY, PERCUTANEOUS  09/17/2016        Prior to Admission medications   Medication Sig Start Date End Date Taking? Authorizing Provider  amLODipine (NORVASC) 5 MG tablet Take 5 mg by mouth daily.    [provider]  atorvastatin (LIPITOR) 40 MG tablet Take 1 tablet (40 mg total) by mouth daily at 6 PM. 06/10/18   Velna Ochs, MD  isosorbide mononitrate (IMDUR) 30 MG 24 hr tablet Take 1 tablet (30 mg total) by mouth daily. Patient not taking:  Reported on 06/19/2018 06/11/18   Velna Ochs, MD  lisinopril (PRINIVIL,ZESTRIL) 10 MG tablet Take 10 mg by mouth daily.    [provider]  oxyCODONE (OXY IR/ROXICODONE) 5 MG immediate release tablet Take 1 tablet (5 mg total) by mouth every 6 (six) hours as needed for severe pain. Patient not taking: Reported on 09/28/2016 09/18/16   Murlean Iba, MD  sodium bicarbonate 650 MG tablet Take 1 tablet (650 mg total) by mouth 2 (two) times daily. 06/10/18   Velna Ochs, MD    Allergies Patient has no known allergies.  Family History  Problem Relation Age of Onset  . CAD Father 4  . Hypertension Father   . Chronic Renal Failure Father   . Congestive Heart Failure Sister   . Kidney disease Sister   . Cirrhosis Maternal Grandfather   . Colon cancer Paternal Grandfather     Social History Social History   Tobacco Use  . Smoking status: Current Every Day Smoker    Packs/day: 1.00    Years: 34.00    Pack years: 34.00  . Smokeless tobacco: Never Used  Substance Use Topics  . Alcohol use: Never    Frequency: Never  . Drug use: Never    Review of Systems  Constitutional: No fever/chills Eyes: No visual changes. ENT: No sore throat. Cardiovascular: Denies chest pain. Respiratory: Denies shortness of breath. Gastrointestinal: No abdominal pain.  No nausea, no vomiting.  No diarrhea.  No constipation.  Genitourinary: Negative for dysuria. Musculoskeletal: Negative for back pain. Skin: Positive for bleeding from permacath.  Negative for rash. Neurological: Negative for headaches, focal weakness or numbness.   ____________________________________________   PHYSICAL EXAM:  VITAL SIGNS: ED Triage Vitals [06/22/18 0513]  Enc Vitals Group     BP (!) 185/80     Pulse Rate 73     Resp 20     Temp 97.9 F (36.6 C)     Temp Source Oral     SpO2 100 %     Weight 174 lb (78.9 kg)     Height 6\' 1"  (1.854 m)     Head Circumference      Peak Flow      Pain  Score 0     Pain Loc      Pain Edu?      Excl. in [place]?     Constitutional: Alert and oriented. Well appearing and in no acute distress. Eyes: Conjunctivae are normal. PERRL. EOMI. Head: Atraumatic. Nose: No congestion/rhinnorhea. Mouth/Throat: Mucous membranes are moist.  Oropharynx non-erythematous. Neck: No stridor.   Cardiovascular: Normal rate, regular rhythm. Grossly normal heart sounds.  Good peripheral circulation. Respiratory: Normal respiratory effort.  No retractions. Lungs CTAB. Gastrointestinal: Soft and nontender. No distention. No abdominal bruits. No CVA tenderness. Musculoskeletal: No lower extremity tenderness nor edema.  No joint effusions. Neurologic:  Normal speech and language. No gross focal neurologic deficits are appreciated. No gait instability. Skin:  Skin is warm, dry and intact. No rash noted.  Bandage removed which is covering permacath site.  There is some clotted blood around the insertion site and the Biopatch is dislodged.  Using light pressure, I palpated around the insertion site and there is no active bleeding and unable to express more blood. Psychiatric: Mood and affect are normal. Speech and behavior are normal.  ____________________________________________   LABS (all labs ordered are listed, but only abnormal results are displayed)  Labs Reviewed - No data to display ____________________________________________  EKG  None ____________________________________________  RADIOLOGY  ED MD interpretation: None  Official radiology report(s): No results found.  ____________________________________________   PROCEDURES  Procedure(s) performed (including Critical Care):  Procedures   ____________________________________________   INITIAL IMPRESSION / ASSESSMENT AND PLAN / ED COURSE  As part of my medical decision making, I reviewed the following data within the [facility] notes reviewed and incorporated, Old  chart reviewed and Notes from prior ED visits        58 year old male who presents with postoperative bleeding.  Permacath insertion site intact without active bleeding.  Biopatch was dislodged.  Nursing to clean the site, replace with new Biopatch and dressed the area.  Strict return precautions given.  Patient verbalizes understanding agrees with plan of care.      ____________________________________________   FINAL CLINICAL IMPRESSION(S) / ED DIAGNOSES  Final diagnoses:  Postop check     ED Discharge Orders    None       Note:  This document was prepared using Dragon voice recognition software and may include unintentional dictation errors.   Paulette Blanch, MD 06/22/18 [phone]

## 2018-06-22 NOTE — ED Triage Notes (Signed)
Pt had perma cath placed to right upper chest Monday, noted small amt of bleeding tonight.

## 2018-06-26 ENCOUNTER — Ambulatory Visit: Payer: 59

## 2018-07-13 ENCOUNTER — Other Ambulatory Visit (INDEPENDENT_AMBULATORY_CARE_PROVIDER_SITE_OTHER): Payer: Self-pay | Admitting: Vascular Surgery

## 2018-07-13 DIAGNOSIS — N186 End stage renal disease: Secondary | ICD-10-CM

## 2018-07-17 ENCOUNTER — Encounter (INDEPENDENT_AMBULATORY_CARE_PROVIDER_SITE_OTHER): Payer: Self-pay

## 2018-07-17 ENCOUNTER — Ambulatory Visit (INDEPENDENT_AMBULATORY_CARE_PROVIDER_SITE_OTHER): Payer: Self-pay | Admitting: Nurse Practitioner

## 2018-07-17 ENCOUNTER — Other Ambulatory Visit (INDEPENDENT_AMBULATORY_CARE_PROVIDER_SITE_OTHER): Payer: Self-pay

## 2018-07-19 ENCOUNTER — Other Ambulatory Visit: Payer: Self-pay

## 2018-07-19 ENCOUNTER — Encounter
Admission: RE | Disposition: A | Discharge status: Home / Self Care | Payer: Self-pay | Source: Home / Self Care | Attending: Vascular Surgery

## 2018-07-19 ENCOUNTER — Other Ambulatory Visit (INDEPENDENT_AMBULATORY_CARE_PROVIDER_SITE_OTHER): Payer: Self-pay | Admitting: Nurse Practitioner

## 2018-07-19 ENCOUNTER — Other Ambulatory Visit (INDEPENDENT_AMBULATORY_CARE_PROVIDER_SITE_OTHER): Payer: Self-pay | Admitting: Vascular Surgery

## 2018-07-19 ENCOUNTER — Ambulatory Visit
Admission: RE | Admit: 2018-07-19 | Discharge: 2018-07-19 | Disposition: A | Discharge status: Home / Self Care | Payer: 59 | Attending: Vascular Surgery | Admitting: Vascular Surgery

## 2018-07-19 ENCOUNTER — Encounter: Payer: Self-pay | Admitting: *Deleted

## 2018-07-19 DIAGNOSIS — N186 End stage renal disease: Secondary | ICD-10-CM

## 2018-07-19 DIAGNOSIS — Y841 Kidney dialysis as the cause of abnormal reaction of the patient, or of later complication, without mention of misadventure at the time of the procedure: Secondary | ICD-10-CM | POA: Insufficient documentation

## 2018-07-19 DIAGNOSIS — T82868A Thrombosis of vascular prosthetic devices, implants and grafts, initial encounter: Secondary | ICD-10-CM

## 2018-07-19 DIAGNOSIS — Z8249 Family history of ischemic heart disease and other diseases of the circulatory system: Secondary | ICD-10-CM | POA: Insufficient documentation

## 2018-07-19 DIAGNOSIS — I120 Hypertensive chronic kidney disease with stage 5 chronic kidney disease or end stage renal disease: Secondary | ICD-10-CM | POA: Insufficient documentation

## 2018-07-19 DIAGNOSIS — F17200 Nicotine dependence, unspecified, uncomplicated: Secondary | ICD-10-CM

## 2018-07-19 DIAGNOSIS — Z992 Dependence on renal dialysis: Secondary | ICD-10-CM | POA: Insufficient documentation

## 2018-07-19 DIAGNOSIS — I2510 Atherosclerotic heart disease of native coronary artery without angina pectoris: Secondary | ICD-10-CM

## 2018-07-19 DIAGNOSIS — F17210 Nicotine dependence, cigarettes, uncomplicated: Secondary | ICD-10-CM | POA: Insufficient documentation

## 2018-07-19 HISTORY — PX: DIALYSIS/PERMA CATHETER INSERTION: CATH118288

## 2018-07-19 LAB — POTASSIUM (ARMC VASCULAR LAB ONLY): Potassium (ARMC vascular lab): 4.9 (ref 3.5–5.1)

## 2018-07-19 SURGERY — DIALYSIS/PERMA CATHETER INSERTION
Anesthesia: Moderate Sedation

## 2018-07-19 MED ORDER — CEFAZOLIN SODIUM-DEXTROSE 1-4 GM/50ML-% IV SOLN
1.0000 g | Freq: Once | INTRAVENOUS | Status: AC
  Administered 2018-07-19: 1 g via INTRAVENOUS

## 2018-07-19 MED ORDER — ONDANSETRON HCL 4 MG/2ML IJ SOLN: 4.0000 mg | Freq: Four times a day (QID) | INTRAMUSCULAR | Status: DC | PRN

## 2018-07-19 MED ORDER — SODIUM CHLORIDE 0.9 % IV SOLN
5.0000 mg | Freq: Once | INTRAVENOUS | Status: AC
  Administered 2018-07-19: 5 mg via INTRAVENOUS
  Filled 2018-07-19: qty 5

## 2018-07-19 MED ORDER — SODIUM CHLORIDE 0.9 % IV SOLN: INTRAVENOUS | Status: DC

## 2018-07-19 MED ORDER — DIPHENHYDRAMINE HCL 50 MG/ML IJ SOLN: 50.0000 mg | Freq: Once | INTRAMUSCULAR | Status: DC | PRN

## 2018-07-19 MED ORDER — SODIUM CHLORIDE 0.9 % IV SOLN: 5.0000 mg | Freq: Once | INTRAVENOUS | Status: DC

## 2018-07-19 MED ORDER — MIDAZOLAM HCL 5 MG/5ML IJ SOLN
INTRAMUSCULAR | Status: AC
  Filled 2018-07-19: qty 10

## 2018-07-19 MED ORDER — MIDAZOLAM HCL 2 MG/ML PO SYRP: 8.0000 mg | ORAL_SOLUTION | Freq: Once | ORAL | Status: DC | PRN

## 2018-07-19 MED ORDER — MIDAZOLAM HCL 2 MG/2ML IJ SOLN
INTRAMUSCULAR | Status: DC | PRN
  Administered 2018-07-19: 2 mg via INTRAVENOUS

## 2018-07-19 MED ORDER — FAMOTIDINE 20 MG PO TABS: 40.0000 mg | ORAL_TABLET | Freq: Once | ORAL | Status: DC | PRN

## 2018-07-19 MED ORDER — CEFAZOLIN SODIUM-DEXTROSE 2-4 GM/100ML-% IV SOLN: 2.0000 g | Freq: Once | INTRAVENOUS | Status: DC

## 2018-07-19 MED ORDER — SODIUM CHLORIDE 0.9 % IV SOLN
5.0000 mg | Freq: Once | INTRAVENOUS | Status: DC
  Filled 2018-07-19 (×2): qty 5

## 2018-07-19 MED ORDER — SODIUM CHLORIDE 0.9 % IV SOLN
5.0000 mg | Freq: Once | INTRAVENOUS | Status: DC
  Filled 2018-07-19: qty 5

## 2018-07-19 MED ORDER — FENTANYL CITRATE (PF) 100 MCG/2ML IJ SOLN
INTRAMUSCULAR | Status: AC
  Filled 2018-07-19: qty 4

## 2018-07-19 MED ORDER — HYDROMORPHONE HCL 1 MG/ML IJ SOLN: 1.0000 mg | Freq: Once | INTRAMUSCULAR | Status: DC | PRN

## 2018-07-19 MED ORDER — FENTANYL CITRATE (PF) 100 MCG/2ML IJ SOLN
INTRAMUSCULAR | Status: DC | PRN
  Administered 2018-07-19: 50 ug via INTRAVENOUS

## 2018-07-19 MED ORDER — METHYLPREDNISOLONE SODIUM SUCC 125 MG IJ SOLR: 125.0000 mg | Freq: Once | INTRAMUSCULAR | Status: DC | PRN

## 2018-07-19 MED ORDER — LIDOCAINE-EPINEPHRINE (PF) 1 %-1:200000 IJ SOLN
INTRAMUSCULAR | Status: AC
  Filled 2018-07-19: qty 30

## 2018-07-19 MED ORDER — HEPARIN SODIUM (PORCINE) 10000 UNIT/ML IJ SOLN
INTRAMUSCULAR | Status: AC
  Filled 2018-07-19: qty 1

## 2018-07-19 SURGICAL SUPPLY — 6 items
BIOPATCH RED 1 DISK 7.0 (GAUZE/BANDAGES/DRESSINGS) ×2 IMPLANT
BIOPATCH RED 1IN DISK 7.0MM (GAUZE/BANDAGES/DRESSINGS) ×1
GUIDEWIRE SUPER STIFF .035X180 (WIRE) ×3 IMPLANT
PACK ANGIOGRAPHY (CUSTOM PROCEDURE TRAY) ×3 IMPLANT
SUT MNCRL AB 4-0 PS2 18 (SUTURE) ×3 IMPLANT
SUT SILK 0 FSL (SUTURE) ×3 IMPLANT

## 2018-07-19 NOTE — H&P (Signed)
[facility] Admission History & Physical  MRN : [id number]  Geoffrey Rangel is a 58 y.o. (18-Feb-1961) male who presents with chief complaint of No chief complaint on file. Marland Kitchen  History of Present Illness:  I am asked to evaluate the patient by the dialysis center. The patient was sent here because they were unable to achieve adequate dialysis yesterday. Furthermore the Center states they were unable to aspirate either lumen of the catheter yesterday.  This problem has been getting worse for about 1 week. The patient is unaware of any other change.   Patient denies pain or tenderness overlying the access.  There is no pain with dialysis.  Patient denies fevers or shaking chills while on dialysis.    There have multiple any past interventions and declots of his multiple different access.  The patient is not chronically hypotensive on dialysis.   Current Facility-Administered Medications  Medication Dose Route Frequency Provider Last Rate Last Dose  . 0.9 %  sodium chloride infusion   Intravenous Continuous Kris Hartmann, NP      . diphenhydrAMINE (BENADRYL) injection 50 mg  50 mg Intravenous Once PRN Kris Hartmann, NP      . famotidine (PEPCID) tablet 40 mg  40 mg Oral Once PRN Kris Hartmann, NP      . HYDROmorphone (DILAUDID) injection 1 mg  1 mg Intravenous Once PRN Eulogio Ditch E, NP      . methylPREDNISolone sodium succinate (SOLU-MEDROL) 125 mg/2 mL injection 125 mg  125 mg Intravenous Once PRN Eulogio Ditch E, NP      . midazolam (VERSED) 2 MG/ML syrup 8 mg  8 mg Oral Once PRN Kris Hartmann, NP      . ondansetron (ZOFRAN) injection 4 mg  4 mg Intravenous Q6H PRN Kris Hartmann, NP         Past Surgical History:  Procedure Laterality Date  . DIALYSIS/PERMA CATHETER INSERTION N/A 06/19/2018   Procedure: DIALYSIS/PERMA CATHETER INSERTION;  Surgeon: Algernon Huxley, MD;  Location: [facility];  Service: Cardiovascular;  Laterality: N/A;  . RENAL  BIOPSY, PERCUTANEOUS  09/17/2016        Social History Social History   Tobacco Use  . Smoking status: Current Every Day Smoker    Packs/day: 1.00    Years: 34.00    Pack years: 34.00  . Smokeless tobacco: Never Used  Substance Use Topics  . Alcohol use: Never    Frequency: Never  . Drug use: Never    Family History Family History  Problem Relation Age of Onset  . CAD Father 25  . Hypertension Father   . Chronic Renal Failure Father   . Congestive Heart Failure Sister   . Kidney disease Sister   . Cirrhosis Maternal Grandfather   . Colon cancer Paternal Grandfather     No family history of bleeding or clotting disorders, autoimmune disease or porphyria  No Known Allergies   REVIEW OF SYSTEMS (Negative unless checked)  Constitutional: [] Weight loss  [] Fever  [] Chills Cardiac: [] Chest pain   [] Chest pressure   [] Palpitations   [] Shortness of breath when laying flat   [] Shortness of breath at rest   [x] Shortness of breath with exertion. Vascular:  [] Pain in legs with walking   [] Pain in legs at rest   [] Pain in legs when laying flat   [] Claudication   [] Pain in feet when walking  [] Pain in feet at rest  [] Pain in feet when  laying flat   [] History of DVT   [] Phlebitis   [] Swelling in legs   [] Varicose veins   [] Non-healing ulcers Pulmonary:   [] Uses home oxygen   [] Productive cough   [] Hemoptysis   [] Wheeze  [] COPD   [] Asthma Neurologic:  [] Dizziness  [] Blackouts   [] Seizures   [] History of stroke   [] History of TIA  [] Aphasia   [] Temporary blindness   [] Dysphagia   [] Weakness or numbness in arms   [] Weakness or numbness in legs Musculoskeletal:  [] Arthritis   [] Joint swelling   [] Joint pain   [] Low back pain Hematologic:  [] Easy bruising  [] Easy bleeding   [] Hypercoagulable state   [] Anemic  [] Hepatitis Gastrointestinal:  [] Blood in stool   [] Vomiting blood  [] Gastroesophageal reflux/heartburn   [] Difficulty swallowing. Genitourinary:  [x] Chronic kidney disease   [] Difficult  urination  [] Frequent urination  [] Burning with urination   [] Blood in urine Skin:  [] Rashes   [] Ulcers   [] Wounds Psychological:  [] History of anxiety   []  History of major depression.  Physical Examination  Vitals:   07/19/18 1200  BP: 125/75  Resp: 12  Temp: 98 F (36.7 C)  TempSrc: Oral  SpO2: 100%  Weight: 82.1 kg  Height: 6\' 1"  (1.854 m)   Body mass index is 23.88 kg/m. Gen: WD/WN, NAD Head: [place]/AT, No temporalis wasting. Prominent temp pulse not noted. Ear/Nose/Throat: Hearing grossly intact, nares w/o erythema or drainage, oropharynx w/o Erythema/Exudate,  Eyes: Conjunctiva clear, sclera non-icteric Neck: Trachea midline.  No JVD.  Pulmonary:  Good air movement, respirations not labored, no use of accessory muscles.  Cardiac: RRR, normal S1, S2. Vascular: right tunneled catheter without tenderness or drainage Vessel Right Left  Radial Palpable Palpable  Gastrointestinal: soft, non-tender/non-distended. No guarding/reflex.  Musculoskeletal: M/S 5/5 throughout.  Extremities without ischemic changes.  No deformity or atrophy.  Neurologic: Sensation grossly intact in extremities.  Symmetrical.  Speech is fluent. Motor exam as listed above. Psychiatric: Judgment intact, Mood & affect appropriate for pt's clinical situation. Dermatologic: No rashes or ulcers noted.  No cellulitis or open wounds. Lymph : No Cervical, Axillary, or Inguinal lymphadenopathy.   CBC Lab Results  Component Value Date   WBC 8.6 06/10/2018   HGB 12.2 (L) 06/10/2018   HCT 38.6 (L) 06/10/2018   MCV 93.0 06/10/2018   PLT 187 06/10/2018    BMET    Component Value Date/Time   NA 138 06/09/2018 0229   K 4.8 06/09/2018 0229   CL 112 (H) 06/09/2018 0229   CO2 18 (L) 06/09/2018 0229   GLUCOSE 87 06/09/2018 0229   BUN 72 (H) 06/09/2018 0229   CREATININE 5.47 (H) 06/09/2018 0229   CALCIUM 8.5 (L) 06/09/2018 0229   GFRNONAA 11 (L) 06/09/2018 0229   GFRAA 12 (L) 06/09/2018 0229   CrCl cannot  be calculated (Patient's most recent lab result is older than the maximum 21 days allowed.).  COAG Lab Results  Component Value Date   INR 1.1 06/08/2018   INR 1.06 09/15/2016    Radiology No results found.  Assessment/Plan 1.  Complication dialysis device with thrombosis AV access:  Patient's right tunneled catheter is thrombosed. The patient will undergo exchange of the catheter same venous access using interventional techniques.  The risks and benefits were described to the patient.  All questions were answered.  The patient agrees to proceed with intervention.  2.  End-stage renal disease requiring hemodialysis:  Patient will continue dialysis therapy without further interruption if a successful exchange is not achieved  then new site will be found for tunneled catheter placement. Dialysis has already been arranged since the patient missed their previous session 3.  Hypertension:  Patient will continue medical management; nephrology is following no changes in oral medications. 4.  Coronary artery disease:  EKG will be monitored. Nitrates will be used if needed. The patient's oral cardiac medications will be continued.    Hortencia Pilar, MD  07/19/2018 1:10 PM

## 2018-07-19 NOTE — Op Note (Signed)
[facility]  Percutaneous Study/Intervention Procedural Note   Date of Surgery: 07/19/2018,1:57 PM  Surgeon:Champion Corales, Dolores Lory   Pre-operative Diagnosis: Complication of tunneled dialysis catheter  Post-operative diagnosis:  Same  Procedure(s) Performed:  1.  Fluoroscopic evaluation of dialysis catheter  2.  TPA infusion dialysis catheter   Anesthesia: Conscious sedation was administered by the interventional radiology RN under my direct supervision. IV Versed plus fentanyl were utilized. Continuous ECG, pulse oximetry and blood pressure was monitored throughout the entire procedure.  Conscious sedation was administered for a total of 25 minutes.  Sheath: None  Contrast: None cc   Fluoroscopy Time: 0.1 minutes  Indications:  The patient presents to [facility] by the dialysis center because his catheter is not working.  Risks and benefits for catheter exchange as well as TPA infusion is review all questions were answered patient agrees to proceed.   Procedure:  Geoffrey Rangel a 58 y.o. male who was identified and appropriate procedural time out was performed.  The patient was then placed supine on the table and prepped and draped in the usual sterile fashion.   Both lumens of his tunneled dialysis catheter are aspirated.  They aspirated quite easily.  They are then flushed with heparinized saline.  Fluoroscopy is then used to evaluate the entire catheter from the hub all the way to the tip.  It is noted to be smooth in contour free of any kinks or fractures and the tip is centered in the right atrium.  1% lidocaine is infiltrated into the soft tissues at the exit site and 0 silk sutures are used to secure the catheter to the chest wall.  He is then taken to special procedures postop where 2 mg of TPA is reconstituted and 50 cc 2 aliquots are created and simultaneous infusion over 2 hours is performed for catheter clearance.  The catheter is left packed  with TPA and is discharged home and will follow-up with dialysis for his usual dialysis sessions.  Disposition: Patient was taken to the recovery room in stable condition having tolerated the procedure well.  Geoffrey Rangel 07/19/2018,1:57 PM

## 2018-07-20 ENCOUNTER — Encounter: Payer: Self-pay | Admitting: Vascular Surgery

## 2018-07-31 ENCOUNTER — Encounter (INDEPENDENT_AMBULATORY_CARE_PROVIDER_SITE_OTHER): Payer: Self-pay | Admitting: Nurse Practitioner

## 2018-08-03 ENCOUNTER — Ambulatory Visit (INDEPENDENT_AMBULATORY_CARE_PROVIDER_SITE_OTHER): Payer: Self-pay

## 2018-08-03 ENCOUNTER — Ambulatory Visit (INDEPENDENT_AMBULATORY_CARE_PROVIDER_SITE_OTHER): Payer: Self-pay | Admitting: Nurse Practitioner

## 2018-08-03 ENCOUNTER — Encounter (INDEPENDENT_AMBULATORY_CARE_PROVIDER_SITE_OTHER): Payer: Self-pay | Admitting: Nurse Practitioner

## 2018-08-03 ENCOUNTER — Other Ambulatory Visit: Payer: Self-pay

## 2018-08-03 VITALS — BP 158/82 | HR 66 | Resp 16 | Ht 73.0 in | Wt 191.0 lb

## 2018-08-03 DIAGNOSIS — N186 End stage renal disease: Secondary | ICD-10-CM

## 2018-08-03 DIAGNOSIS — I120 Hypertensive chronic kidney disease with stage 5 chronic kidney disease or end stage renal disease: Secondary | ICD-10-CM

## 2018-08-03 DIAGNOSIS — E785 Hyperlipidemia, unspecified: Secondary | ICD-10-CM

## 2018-08-03 DIAGNOSIS — N185 Chronic kidney disease, stage 5: Secondary | ICD-10-CM

## 2018-08-03 DIAGNOSIS — I10 Essential (primary) hypertension: Secondary | ICD-10-CM

## 2018-08-03 DIAGNOSIS — Z79899 Other long term (current) drug therapy: Secondary | ICD-10-CM

## 2018-08-07 ENCOUNTER — Encounter (INDEPENDENT_AMBULATORY_CARE_PROVIDER_SITE_OTHER): Payer: Self-pay | Admitting: Nurse Practitioner

## 2018-08-07 DIAGNOSIS — E785 Hyperlipidemia, unspecified: Secondary | ICD-10-CM | POA: Insufficient documentation

## 2018-08-07 DIAGNOSIS — I10 Essential (primary) hypertension: Secondary | ICD-10-CM | POA: Insufficient documentation

## 2018-08-07 NOTE — Progress Notes (Signed)
SUBJECTIVE:  Patient ID: Geoffrey Rangel, male    DOB: Nov 08, 1960, 58 y.o.   MRN: [id number] Chief Complaint  Patient presents with  . Follow-up    [facility] 4week bue vein mapping    HPI  Geoffrey Rangel is a 58 y.o. male  The patient is seen for evaluation for dialysis access. The patient has chronic renal insufficiency stage V secondary to hypertension. The patient's most recent creatinine clearance is less than 20. The patient has begun dialysis 2 days a week.  He utilizes a right perm cath. The patient is left-handed.  The patient has been considering the various methods of dialysis and wishes to proceed with hemodialysis and therefore creation of AV access.  The patient denies amaurosis fugax or recent TIA symptoms. There are no recent neurological changes noted. The patient denies claudication symptoms or rest pain symptoms. The patient denies history of DVT, PE or superficial thrombophlebitis. The patient denies recent episodes of angina or shortness of breath.   Ultrasound determines that he has adequate access for right brachiocephalic AV fistula today.  Past Medical History:  Diagnosis Date  . Arthritis   . Chronic kidney disease   . Dyspnea    occasional  . GERD (gastroesophageal reflux disease)   . Hyperlipidemia   . Hypertension   . Renal disorder     Past Surgical History:  Procedure Laterality Date  . DIALYSIS/PERMA CATHETER INSERTION N/A 06/19/2018   Procedure: DIALYSIS/PERMA CATHETER INSERTION;  Surgeon: Algernon Huxley, MD;  Location: [facility];  Service: Cardiovascular;  Laterality: N/A;  . DIALYSIS/PERMA CATHETER INSERTION N/A 07/19/2018   Procedure: DIALYSIS/PERMA CATHETER INSERTION;  Surgeon: Katha Cabal, MD;  Location: [facility];  Service: Cardiovascular;  Laterality: N/A;  . RENAL BIOPSY, PERCUTANEOUS  09/17/2016        Social History   Socioeconomic History  . Marital status: Married    Spouse name: Not on file  . Number of  children: Not on file  . Years of education: Not on file  . Highest education level: Not on file  Occupational History  . Not on file  Social Needs  . Financial resource strain: Not on file  . Food insecurity:    Worry: Not on file    Inability: Not on file  . Transportation needs:    Medical: Not on file    Non-medical: Not on file  Tobacco Use  . Smoking status: Current Every Day Smoker    Packs/day: 1.00    Years: 34.00    Pack years: 34.00  . Smokeless tobacco: Never Used  Substance and Sexual Activity  . Alcohol use: Never    Frequency: Never  . Drug use: Never  . Sexual activity: Not on file  Lifestyle  . Physical activity:    Days per week: Not on file    Minutes per session: Not on file  . Stress: Not on file  Relationships  . Social connections:    Talks on phone: Not on file    Gets together: Not on file    Attends religious service: Not on file    Active member of club or organization: Not on file    Attends meetings of clubs or organizations: Not on file    Relationship status: Not on file  . Intimate partner violence:    Fear of current or ex partner: Not on file    Emotionally abused: Not on file    Physically abused: Not  on file    Forced sexual activity: Not on file  Other Topics Concern  . Not on file  Social History Narrative   ** Merged History Encounter **        Family History  Problem Relation Age of Onset  . CAD Father 89  . Hypertension Father   . Chronic Renal Failure Father   . Congestive Heart Failure Sister   . Kidney disease Sister   . Cirrhosis Maternal Grandfather   . Colon cancer Paternal Grandfather     No Known Allergies   Review of Systems   Review of Systems: Negative Unless Checked Constitutional: [] Weight loss  [] Fever  [] Chills Cardiac: [] Chest pain   []  Atrial Fibrillation  [] Palpitations   [] Shortness of breath when laying flat   [] Shortness of breath with exertion. [] Shortness of breath at rest Vascular:   [] Pain in legs with walking   [] Pain in legs with standing [] Pain in legs when laying flat   [] Claudication    [] Pain in feet when laying flat    [] History of DVT   [] Phlebitis   [] Swelling in legs   [] Varicose veins   [] Non-healing ulcers Pulmonary:   [] Uses home oxygen   [] Productive cough   [] Hemoptysis   [] Wheeze  [] COPD   [] Asthma Neurologic:  [] Dizziness   [] Seizures  [] Blackouts [] History of stroke   [] History of TIA  [] Aphasia   [] Temporary Blindness   [] Weakness or numbness in arm   [] Weakness or numbness in leg Musculoskeletal:   [] Joint swelling   [] Joint pain   [] Low back pain  []  History of Knee Replacement [] Arthritis [] back Surgeries  []  Spinal Stenosis    Hematologic:  [] Easy bruising  [] Easy bleeding   [] Hypercoagulable state   [x] Anemic Gastrointestinal:  [] Diarrhea   [] Vomiting  [] Gastroesophageal reflux/heartburn   [] Difficulty swallowing. [] Abdominal pain Genitourinary:  [x] Chronic kidney disease   [] Difficult urination  [] Anuric   [] Blood in urine [] Frequent urination  [] Burning with urination   [] Hematuria Skin:  [] Rashes   [] Ulcers [] Wounds Psychological:  [] History of anxiety   []  History of major depression  []  Memory Difficulties      OBJECTIVE:   Physical Exam  BP (!) 158/82 (BP Location: Left Arm)   Pulse 66   Resp 16   Ht 6\' 1"  (1.854 m)   Wt 191 lb (86.6 kg)   BMI 25.20 kg/m   Gen: WD/WN, NAD Head: [place]/AT, No temporalis wasting.  Ear/Nose/Throat: Hearing grossly intact, nares w/o erythema or drainage Eyes: PER, EOMI, sclera nonicteric.  Neck: Supple, no masses.  No JVD.  Pulmonary:  Good air movement, no use of accessory muscles.  Cardiac: RRR Vascular:  Vessel Right Left  Radial Palpable Palpable   Gastrointestinal: soft, non-distended. No guarding/no peritoneal signs.  Musculoskeletal: M/S 5/5 throughout.  No deformity or atrophy.  Neurologic: Pain and light touch intact in extremities.  Symmetrical.  Speech is fluent. Motor exam as listed above.  Psychiatric: Judgment intact, Mood & affect appropriate for pt's clinical situation. Dermatologic: No Venous rashes. No Ulcers Noted.  No changes consistent with cellulitis. Lymph : No Cervical lymphadenopathy, no lichenification or skin changes of chronic lymphedema.       ASSESSMENT AND PLAN:  1. Chronic kidney disease, stage V ([place]) Recommend:  At this time the patient does not have appropriate extremity access for dialysis  Patient should have a right brachiocephalic AV fistula created.  The risks, benefits and alternative therapies were reviewed in detail with the patient.  All  questions were answered.  The patient agrees to proceed with surgery.    2. Essential hypertension Continue antihypertensive medications as already ordered, these medications have been reviewed and there are no changes at this time.   3. Hyperlipidemia, unspecified hyperlipidemia type Continue statin as ordered and reviewed, no changes at this time    Current Outpatient Medications on File Prior to Visit  Medication Sig Dispense Refill  . amLODipine (NORVASC) 5 MG tablet Take 5 mg by mouth daily.    Marland Kitchen aspirin 325 MG EC tablet Take 325 mg by mouth daily.    Marland Kitchen atorvastatin (LIPITOR) 40 MG tablet Take 1 tablet (40 mg total) by mouth daily at 6 PM. (Patient taking differently: Take 40 mg by mouth daily. ) 30 tablet 2  . lisinopril (PRINIVIL,ZESTRIL) 10 MG tablet Take 10 mg by mouth daily.    . sodium bicarbonate 650 MG tablet Take 1 tablet (650 mg total) by mouth 2 (two) times daily. 60 tablet 2  . isosorbide mononitrate (IMDUR) 30 MG 24 hr tablet Take 1 tablet (30 mg total) by mouth daily. (Patient not taking: Reported on 06/19/2018) 30 tablet 2   No current facility-administered medications on file prior to visit.     There are no Patient Instructions on file for this visit. No follow-ups on file.   Kris Hartmann, NP  This note was completed with Sales executive.  Any errors are purely  unintentional.

## 2018-08-08 ENCOUNTER — Other Ambulatory Visit: Payer: Self-pay

## 2018-08-08 ENCOUNTER — Encounter: Payer: Self-pay | Admitting: Adult Health

## 2018-08-08 ENCOUNTER — Ambulatory Visit (INDEPENDENT_AMBULATORY_CARE_PROVIDER_SITE_OTHER): Payer: Self-pay | Admitting: Adult Health

## 2018-08-08 DIAGNOSIS — Z0000 Encounter for general adult medical examination without abnormal findings: Secondary | ICD-10-CM

## 2018-08-08 DIAGNOSIS — I10 Essential (primary) hypertension: Secondary | ICD-10-CM

## 2018-08-08 DIAGNOSIS — N186 End stage renal disease: Secondary | ICD-10-CM

## 2018-08-08 DIAGNOSIS — E785 Hyperlipidemia, unspecified: Secondary | ICD-10-CM

## 2018-08-08 NOTE — Assessment & Plan Note (Signed)
CKD V- now End Stage Renal  06/19/2018-right-sided permacath placed  07/19/2018- hemodialysis catheter via the right internal jugular vein placed due to occlusion of original port. Followed by Dr. [facility]- He reports HD Tuesday/Sat and Dr. Merita Norton will "visit him at the dialysis center on sat". He reports discussion of starting peritoneal dialysis in the future. He has not followed up with Cardiology since ED visit for CP He states "Dr. Merita Norton is going to set my up there, b/c I need some sort of okay from the heart doctor" Reports being placed on 32 oz fluid restriction/day and renal diet.

## 2018-08-08 NOTE — Progress Notes (Signed)
Virtual Visit via Telephone Note  I connected with Geoffrey Rangel on 08/08/18 at  2:30 PM EDT by telephone and verified that I am speaking with the correct person using two identifiers.  Location: Patient: Home Provider: In Clinic   I discussed the limitations, risks, security and privacy concerns of performing an evaluation and management service by telephone and the availability of in person appointments. I also discussed with the patient that there may be a patient responsible charge related to this service. The patient expressed understanding and agreed to proceed.   History of Present Illness: Geoffrey Rangel calls in today to establish as a new pt. He is a pleasant 58 year old male. PMH: HTN, HLD, CKD stage V-started hemodialysis 1 month ago. Reports "Mild heart attack" June 08, 2018 ED Notes-Initial concern for possible STEMI but serial trops remained negative and chest pain resolved. Cath deferred due to progressive CKD. NM stress test without ischemia. Echocardiogram unremarkable with normal EF. Smoking cessation encouraged. Started on atorvastatin, lipid panel with LDL 134.  06/19/2018-right-sided permacath placed  07/19/2018- hemodialysis catheter via the right internal jugular vein placed due to occlusion of original port. Followed by Dr. [facility]- He reports HD tuesdaySat and Dr. Merita Norton will "visit him at the dialysis center on sat". He reports discussion of starting peritoneal dialysis in the future. He has not followed up with Cardiology since ED visit for CP He states "Dr. Merita Norton is going to set my up there, b/c I need some sort of okay from the heart doctor" Reports being placed on 32 oz fluid restriction/day and renal diet. 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 Healthcare maintenance 08/08/2018  . Essential hypertension 08/07/2018  . Hyperlipidemia 08/07/2018  . Chronic kidney disease, stage V ([place]) 06/09/2018  . Prostate enlargement 06/09/2018  . Chest pain 06/08/2018  . Proteinuria 09/17/2016     Past Medical History:  Diagnosis Date  . Arthritis   . Chronic kidney disease   . Dyspnea    occasional  . GERD (gastroesophageal reflux disease)   . Hyperlipidemia   . Hypertension   . Renal disorder      Past Surgical History:  Procedure Laterality Date  . DIALYSIS/PERMA CATHETER INSERTION N/A 06/19/2018   Procedure: DIALYSIS/PERMA CATHETER INSERTION;  Surgeon: Algernon Huxley, MD;  Location: [facility];  Service: Cardiovascular;  Laterality: N/A;  . DIALYSIS/PERMA CATHETER INSERTION N/A 07/19/2018   Procedure: DIALYSIS/PERMA CATHETER INSERTION;  Surgeon: Katha Cabal, MD;  Location: [facility];  Service: Cardiovascular;  Laterality: N/A;  . RENAL BIOPSY, PERCUTANEOUS  09/17/2016         Family History  Problem  Relation Age of Onset  . CAD Father 43  . Hypertension Father   . Chronic Renal Failure Father   . Congestive Heart Failure Sister   . Kidney disease Sister   . Cirrhosis Maternal Grandfather   . Alcohol abuse Maternal Grandfather   . Colon cancer Paternal Grandfather   . Congestive Heart Failure Paternal Grandmother      Social History    Substance and Sexual Activity  Drug Use Never     Social History   Substance and Sexual Activity  Alcohol Use Never  . Frequency: Never     Social History   Tobacco Use  Smoking Status Current Every Day Smoker  . Packs/day: 1.00  . Years: 34.00  . Pack years: 34.00  Smokeless Tobacco Former Systems developer  . Types: Snuff     Outpatient Encounter Medications as of 08/08/2018  Medication Sig  . amLODipine (NORVASC) 5 MG tablet Take 5 mg by mouth daily.  Marland Kitchen aspirin 325 MG EC tablet Take 325 mg by mouth daily.  Marland Kitchen atorvastatin (LIPITOR) 40 MG tablet Take 1 tablet (40 mg total) by mouth daily at 6 PM. (Patient taking differently: Take 40 mg by mouth daily. )  . isosorbide mononitrate (IMDUR) 30 MG 24 hr tablet Take 1 tablet (30 mg total) by mouth daily.  Marland Kitchen lisinopril (PRINIVIL,ZESTRIL) 10 MG tablet Take 10 mg by mouth daily.  . sodium bicarbonate 650 MG tablet Take 1 tablet (650 mg total) by mouth 2 (two) times daily.   No facility-administered encounter medications on file as of 08/08/2018.     Allergies: Patient has no known allergies.  There is no height or weight on file to calculate BMI.  There were no vitals taken for this visit. Review of Systems: General:   No F/C, wt loss Pulm:   No DIB, SOB, pleuritic chest pain Card:  No CP, palpitations Abd:  No n/v/d or pain Ext:  No inc edema from baseline COVID-19 Education: Signs and symptoms of COVID-19 infection were discussed with pt and how to seek care for testing.  The importance of following the Stay at Home order, and when out- Social Distancing and wearing a facial mask were discussed today.   Observations/Objective: No acute distress noted during the telephone conversation   Assessment and Plan: Schedule lab appt 1-2 weeks, re: A1c- will confirm he has established with Cardiologist then, if not will refer Regular f/u 4 months Continue close f/u with Nephrologist  Continue fluid restriction and renal diet per  Nephrologist   COVID-19 Education: Signs and symptoms of COVID-19 infection were discussed with pt and how to seek care for testing.  The importance of following the Stay at Home order, and when out- Social Distancing and wearing a facial mask were discussed today.  Follow Up Instructions: Schedule lab appt 1-2 weeks, re: A1c- will confirm he has established with Cardiologist then, if not will refer Regular f/u 4 months   I discussed the assessment and treatment plan with the patient. The patient was provided an opportunity to ask questions and all were answered. The patient agreed with the plan and demonstrated an understanding of the instructions.   The patient was advised to call back or seek an in-person evaluation if the symptoms worsen or if the condition fails to improve as anticipated.  I provided 38 minutes of non-face-to-face time during this encounter.   Esaw Grandchild, NP

## 2018-08-08 NOTE — Assessment & Plan Note (Signed)
Assessment and Plan: Schedule lab appt 1-2 weeks, re: A1c- will confirm he has established with Cardiologist then, if not will refer Regular f/u 4 months Continue close f/u with Nephrologist  Continue fluid restriction and renal diet per Nephrologist   COVID-19 Education: Signs and symptoms of COVID-19 infection were discussed with pt and how to seek care for testing.  The importance of following the Stay at Home order, and when out- Social Distancing and wearing a facial mask were discussed today.  Follow Up Instructions: Schedule lab appt 1-2 weeks, re: A1c- will confirm he has established with Cardiologist then, if not will refer Regular f/u 4 months   I discussed the assessment and treatment plan with the patient. The patient was provided an opportunity to ask questions and all were answered. The patient agreed with the plan and demonstrated an understanding of the instructions.   The patient was advised to call back or seek an in-person evaluation if the symptoms worsen or if the condition fails to improve as anticipated.

## 2018-08-11 ENCOUNTER — Telehealth: Payer: Self-pay | Admitting: Cardiovascular Disease

## 2018-08-13 NOTE — Progress Notes (Unsigned)
No Show

## 2018-08-14 ENCOUNTER — Other Ambulatory Visit: Payer: Self-pay

## 2018-08-14 ENCOUNTER — Telehealth: Payer: Self-pay | Admitting: Cardiovascular Disease

## 2018-08-31 ENCOUNTER — Telehealth: Payer: Self-pay

## 2018-08-31 NOTE — Telephone Encounter (Signed)
Virtual Visit Pre-Appointment Phone Call  "Freddy, I am calling you today to discuss your upcoming appointment. We are currently trying to limit exposure to the virus that causes COVID-19 by seeing patients at home rather than in the office."  1. "What is the BEST phone number to call the day of the visit?" - include this in appointment notes  2. "Do you have or have access to (through a family member/friend) a smartphone with video capability that we can use for your visit?" a. If yes - list this number in appt notes as "cell" (if different from BEST phone #) and list the appointment type as a VIDEO visit in appointment notes b. If no - list the appointment type as a PHONE visit in appointment notes  3. Confirm consent - "In the setting of the current Covid19 crisis, you are scheduled for a video visit with your provider on 09/01/2018 at 3:20PM.  Just as we do with many in-office visits, in order for you to participate in this visit, we must obtain consent.  If you'd like, I can send this to your mychart (if signed up) or email for you to review.  Otherwise, I can obtain your verbal consent now.  All virtual visits are billed to your insurance company just like a normal visit would be.  By agreeing to a virtual visit, we'd like you to understand that the technology does not allow for your provider to perform an examination, and thus may limit your provider's ability to fully assess your condition. If your provider identifies any concerns that need to be evaluated in person, we will make arrangements to do so.  Finally, though the technology is pretty good, we cannot assure that it will always work on either your or our end, and in the setting of a video visit, we may have to convert it to a phone-only visit.  In either situation, we cannot ensure that we have a secure connection.  Are you willing to proceed?" STAFF: Did the patient verbally acknowledge consent to telehealth visit? Document YES/NO here:  YES  4. Advise patient to be prepared - "Two hours prior to your appointment, go ahead and check your blood pressure, pulse, oxygen saturation, and your weight (if you have the equipment to check those) and write them all down. When your visit starts, your provider will ask you for this information. If you have an Apple Watch or Kardia device, please plan to have heart rate information ready on the day of your appointment. Please have a pen and paper handy nearby the day of the visit as well."  5. Give patient instructions for MyChart download to smartphone OR [email or web address] as below if video visit (depending on what platform provider is using)  6. Inform patient they will receive a phone call 15 minutes prior to their appointment time (may be from unknown caller ID) so they should be prepared to answer    TELEPHONE CALL NOTE  Geoffrey Rangel has been deemed a candidate for a follow-up tele-health visit to limit community exposure during the Covid-19 pandemic. I spoke with the patient via phone to ensure availability of phone/video source, confirm preferred email & phone number, and discuss instructions and expectations.  I reminded ADRIC WREDE to be prepared with any vital sign and/or heart rhythm information that could potentially be obtained via home monitoring, at the time of his visit. I reminded TYRELLE RACZKA to expect a phone call prior to his visit.  Rene Paci McClain 08/31/2018 9:20 AM   INSTRUCTIONS FOR DOWNLOADING THE MYCHART APP TO SMARTPHONE  - The patient must first make sure to have activated MyChart and know their login information - If Apple, go to [employer] and type in MyChart in the search bar and download the app. If Android, ask patient to go to [employer] and type in [place] in the search bar and download the app. The app is free but as with any other app downloads, their phone may require them to verify saved payment information or Apple/Android password.  -  The patient will need to then log into the app with their MyChart username and password, and select [facility] as their healthcare provider to link the account. When it is time for your visit, go to the MyChart app, find appointments, and click Begin Video Visit. Be sure to Select Allow for your device to access the Microphone and Camera for your visit. You will then be connected, and your provider will be with you shortly.  **If they have any issues connecting, or need assistance please contact MyChart service desk [phone])**  **If using a computer, in order to ensure the best quality for their visit they will need to use either of the following Internet Browsers: [employer], or Google Chrome**  IF USING DOXIMITY or DOXY.ME - The patient will receive a link just prior to their visit by text.     FULL LENGTH CONSENT FOR TELE-HEALTH VISIT   I hereby voluntarily request, consent and authorize [facility] and its employed or contracted physicians, physician assistants, nurse practitioners or other licensed health care professionals (the Practitioner), to provide me with telemedicine health care services (the "Services") as deemed necessary by the treating Practitioner. I acknowledge and consent to receive the Services by the Practitioner via telemedicine. I understand that the telemedicine visit will involve communicating with the Practitioner through live audiovisual communication technology and the disclosure of certain medical information by electronic transmission. I acknowledge that I have been given the opportunity to request an in-person assessment or other available alternative prior to the telemedicine visit and am voluntarily participating in the telemedicine visit.  I understand that I have the right to withhold or withdraw my consent to the use of telemedicine in the course of my care at any time, without affecting my right to future care or treatment, and that the  Practitioner or I may terminate the telemedicine visit at any time. I understand that I have the right to inspect all information obtained and/or recorded in the course of the telemedicine visit and may receive copies of available information for a reasonable fee.  I understand that some of the potential risks of receiving the Services via telemedicine include:  Marland Kitchen Delay or interruption in medical evaluation due to technological equipment failure or disruption; . Information transmitted may not be sufficient (e.g. poor resolution of images) to allow for appropriate medical decision making by the Practitioner; and/or  . In rare instances, security protocols could fail, causing a breach of personal health information.  Furthermore, I acknowledge that it is my responsibility to provide information about my medical history, conditions and care that is complete and accurate to the best of my ability. I acknowledge that Practitioner's advice, recommendations, and/or decision may be based on factors not within their control, such as incomplete or inaccurate data provided by me or distortions of diagnostic images or specimens that may result from electronic transmissions. I understand that  the practice of medicine is not an exact science and that Practitioner makes no warranties or guarantees regarding treatment outcomes. I acknowledge that I will receive a copy of this consent concurrently upon execution via email to the email address I last provided but may also request a printed copy by calling the office of [facility].    I understand that my insurance will be billed for this visit.   I have read or had this consent read to me. . I understand the contents of this consent, which adequately explains the benefits and risks of the Services being provided via telemedicine.  . I have been provided ample opportunity to ask questions regarding this consent and the Services and have had my questions answered to my  satisfaction. . I give my informed consent for the services to be provided through the use of telemedicine in my medical care  By participating in this telemedicine visit I agree to the above.

## 2018-09-01 ENCOUNTER — Other Ambulatory Visit: Payer: Self-pay

## 2018-09-01 ENCOUNTER — Telehealth: Payer: Self-pay | Admitting: Cardiovascular Disease

## 2018-09-02 NOTE — Progress Notes (Signed)
Virtual Visit via Telephone Note   This visit type was conducted due to national recommendations for restrictions regarding the COVID-19 Pandemic (e.g. social distancing) in an effort to limit this patient's exposure and mitigate transmission in our community.  Due to his co-morbid illnesses, this patient is at least at moderate risk for complications without adequate follow up.  This format is felt to be most appropriate for this patient at this time.  The patient did not have access to video technology/had technical difficulties with video requiring transitioning to audio format only (telephone).  All issues noted in this document were discussed and addressed.  No physical exam could be performed with this format.  Please refer to the patient's chart for his  consent to telehealth for [facility].   I connected with  [name] on 09/04/18 by a video enabled telemedicine application and verified that I am speaking with the correct person using two identifiers. I discussed the limitations of evaluation and management by telemedicine. The patient expressed understanding and agreed to proceed.   Evaluation Performed:  Follow-up visit  Date:  09/04/2018   ID:  Geoffrey Rangel, DOB 1961-03-17, MRN [id number]  Patient Location:  [place] [address]   Provider location:   [facility], [place] office  PCP:  Esaw Grandchild, NP  Cardiologist:  Patsy Baltimore   Chief Complaint:  Clogged dialysis cath   History of Present Illness:    Geoffrey Rangel is a 58 y.o. male who presents via audio/video conferencing for a telehealth visit today.   The patient does not symptoms concerning for COVID-19 infection (fever, chills, cough, or new SHORTNESS OF BREATH).   Patient has a past medical history of HTN,  HLD,  CKD stage V-started hemodialysis 1 month ago. Smoker Who presents for HTN, chest pain  In the hospital in [place] 05/2018, chest pain Initial concern for  possible STEMI but serial trops remained negative and chest pain resolved.  Cath deferred due to progressive CKD.  NM stress test without ischemia.   Echocardiogram unremarkable with normal EF.   Smoking still Started on atorvastatin, lipid panel with LDL 134.  Once in a while chest tighten up   Prior CV studies:   The following studies were reviewed today:    Past Medical History:  Diagnosis Date  . Arthritis   . Chronic kidney disease   . Dyspnea    occasional  . GERD (gastroesophageal reflux disease)   . Hyperlipidemia   . Hypertension   . Renal disorder    Past Surgical History:  Procedure Laterality Date  . DIALYSIS/PERMA CATHETER INSERTION N/A 06/19/2018   Procedure: DIALYSIS/PERMA CATHETER INSERTION;  Surgeon: Algernon Huxley, MD;  Location: [place] [facility];  Service: Cardiovascular;  Laterality: N/A;  . DIALYSIS/PERMA CATHETER INSERTION N/A 07/19/2018   Procedure: DIALYSIS/PERMA CATHETER INSERTION;  Surgeon: Katha Cabal, MD;  Location: [place] [facility];  Service: Cardiovascular;  Laterality: N/A;  . RENAL BIOPSY, PERCUTANEOUS  09/17/2016         No outpatient medications have been marked as taking for the 09/04/18 encounter (Telemedicine) with Minna Merritts, MD.     Allergies:   Patient has no known allergies.   Social History   Tobacco Use  . Smoking status: Current Every Day Smoker    Packs/day: 1.00    Years: 34.00    Pack years: 34.00  . Smokeless tobacco: Former Systems developer  Types: Snuff  Substance Use Topics  . Alcohol use: Never    Frequency: Never  . Drug use: Never     Current Outpatient Medications on File Prior to Visit  Medication Sig Dispense Refill  . amLODipine (NORVASC) 5 MG tablet Take 5 mg by mouth daily.    Marland Kitchen aspirin 325 MG EC tablet Take 325 mg by mouth daily.    Marland Kitchen atorvastatin (LIPITOR) 40 MG tablet Take 1 tablet (40 mg total) by mouth daily at 6 PM. (Patient taking differently: Take 40 mg by mouth daily. ) 30 tablet 2   . isosorbide mononitrate (IMDUR) 30 MG 24 hr tablet Take 1 tablet (30 mg total) by mouth daily. 30 tablet 2  . lisinopril (PRINIVIL,ZESTRIL) 10 MG tablet Take 10 mg by mouth daily.    . sodium bicarbonate 650 MG tablet Take 1 tablet (650 mg total) by mouth 2 (two) times daily. 60 tablet 2   No current facility-administered medications on file prior to visit.      Family Hx: The patient's family history includes Alcohol abuse in his maternal grandfather; CAD (age of onset: 28) in his father; Chronic Renal Failure in his father; Cirrhosis in his maternal grandfather; Colon cancer in his paternal grandfather; Congestive Heart Failure in his paternal grandmother and sister; Hypertension in his father; Kidney disease in his sister.  ROS:   Please see the history of present illness.    Review of Systems  Constitutional: Negative.   Respiratory: Positive for shortness of breath.   Cardiovascular: Negative.   Gastrointestinal: Negative.   Musculoskeletal: Negative.   Neurological: Negative.   Psychiatric/Behavioral: Negative.   All other systems reviewed and are negative.    Labs/Other Tests and Data Reviewed:    Recent Labs: 06/08/2018: ALT 14 06/09/2018: BUN 72; Creatinine, Ser 5.47; Potassium 4.8; Sodium 138; TSH 1.781 06/10/2018: Hemoglobin 12.2; Platelets 187   Recent Lipid Panel Lab Results  Component Value Date/Time   CHOL 197 06/08/2018 10:26 AM   TRIG 149 06/08/2018 10:26 AM   HDL 33 (L) 06/08/2018 10:26 AM   CHOLHDL 6.0 06/08/2018 10:26 AM   LDLCALC 134 (H) 06/08/2018 10:26 AM    Wt Readings from Last 3 Encounters:  08/03/18 191 lb (86.6 kg)  07/19/18 181 lb (82.1 kg)  06/22/18 174 lb (78.9 kg)     Exam:    Vital Signs: Vital signs may also be detailed in the HPI There were no vitals taken for this visit.  Wt Readings from Last 3 Encounters:  08/03/18 191 lb (86.6 kg)  07/19/18 181 lb (82.1 kg)  06/22/18 174 lb (78.9 kg)   Temp Readings from Last 3 Encounters:   07/19/18 98 F (36.7 C) (Oral)  06/22/18 97.9 F (36.6 C) (Oral)  06/19/18 97.9 F (36.6 C) (Oral)   BP Readings from Last 3 Encounters:  08/03/18 (!) 158/82  07/19/18 125/72  06/22/18 (!) 185/80   Pulse Readings from Last 3 Encounters:  08/03/18 66  07/19/18 (!) 56  06/22/18 73      Well nourished, well developed male in no acute distress. Constitutional:  oriented to person, place, and time. No distress.    ASSESSMENT & PLAN:    Chronic kidney disease, stage V ([place]) He would be acceptable risk for AV fistula surgery No further cardiac testing needed He has had recent echocardiogram and stress test as above  Chest pain, unspecified type Rare episodes, somewhat atypical Recent echocardiogram and stress test performed, no further work-up needed  Essential  hypertension Blood pressure is well controlled on today's visit. No changes made to the medications.  Mixed hyperlipidemia Recommend he stay on his statin  SOB (shortness of breath) Suspect he may have mild COPD as he continues to smoke and has been smoking 35 years Suggested he try pro-air inhaler   COVID-19 Education: The signs and symptoms of COVID-19 were discussed with the patient and how to seek care for testing (follow up with PCP or arrange E-visit).  The importance of social distancing was discussed today.  Patient Risk:   After full review of this patients clinical status, I feel that they are at least moderate risk at this time.  Time:   Today, I have spent 25 minutes with the patient with telehealth technology discussing the cardiac and medical problems/diagnoses detailed above   10 min spent reviewing the chart prior to patient visit today   Medication Adjustments/Labs and Tests Ordered: Current medicines are reviewed at length with the patient today.  Concerns regarding medicines are outlined above.   Tests Ordered: No tests ordered   Medication Changes: No changes made   Disposition:  Follow-up in 12 months   Signed, Ida Rogue, MD  09/04/2018 5:06 PM    [place] Office [address], [place], [place] [address]

## 2018-09-04 ENCOUNTER — Telehealth (INDEPENDENT_AMBULATORY_CARE_PROVIDER_SITE_OTHER): Payer: 59 | Admitting: Cardiovascular Disease

## 2018-09-04 ENCOUNTER — Other Ambulatory Visit: Payer: Self-pay | Admitting: Internal Medicine

## 2018-09-04 ENCOUNTER — Other Ambulatory Visit: Payer: Self-pay

## 2018-09-04 ENCOUNTER — Other Ambulatory Visit (INDEPENDENT_AMBULATORY_CARE_PROVIDER_SITE_OTHER): Payer: Self-pay | Admitting: Nurse Practitioner

## 2018-09-04 ENCOUNTER — Telehealth (INDEPENDENT_AMBULATORY_CARE_PROVIDER_SITE_OTHER): Payer: Self-pay

## 2018-09-04 DIAGNOSIS — I10 Essential (primary) hypertension: Secondary | ICD-10-CM

## 2018-09-04 DIAGNOSIS — R079 Chest pain, unspecified: Secondary | ICD-10-CM

## 2018-09-04 DIAGNOSIS — I120 Hypertensive chronic kidney disease with stage 5 chronic kidney disease or end stage renal disease: Secondary | ICD-10-CM | POA: Diagnosis not present

## 2018-09-04 DIAGNOSIS — E782 Mixed hyperlipidemia: Secondary | ICD-10-CM | POA: Diagnosis not present

## 2018-09-04 DIAGNOSIS — N185 Chronic kidney disease, stage 5: Secondary | ICD-10-CM | POA: Diagnosis not present

## 2018-09-04 DIAGNOSIS — R0602 Shortness of breath: Secondary | ICD-10-CM

## 2018-09-04 MED ORDER — NITROGLYCERIN 0.4 MG SL SUBL: 0.4000 mg | SUBLINGUAL_TABLET | SUBLINGUAL | 3 refills | Status: DC | PRN

## 2018-09-04 MED ORDER — ALBUTEROL SULFATE HFA 108 (90 BASE) MCG/ACT IN AERS: 1.0000 | INHALATION_SPRAY | RESPIRATORY_TRACT | 1 refills | Status: DC | PRN

## 2018-09-04 NOTE — Patient Instructions (Addendum)
Medication Instructions:  Your physician has recommended you make the following change in your medication:   1) Start Proair inhaler- 1-2 puffs every 4 hours as needed for shortness of breath  2) Nitroglycerin  0.4 mg SL as needed for chest pain- place 1 tablet under the tongue every 5 minutes as needed up to 3 doses at a time  If you need a refill on your cardiac medications before your next appointment, please call your pharmacy.    Lab work: No new labs needed   If you have labs (blood work) drawn today and your tests are completely normal, you will receive your results only by: Marland Kitchen MyChart Message (if you have MyChart) OR . A paper copy in the mail If you have any lab test that is abnormal or we need to change your treatment, we will call you to review the results.   Testing/Procedures: No new testing needed   Follow-Up: At [facility], you and your health needs are our priority.  As part of our continuing mission to provide you with exceptional heart care, we have created designated Provider Care Teams.  These Care Teams include your primary Cardiologist (physician) and Advanced Practice Providers (APPs -  Physician Assistants and Nurse Practitioners) who all work together to provide you with the care you need, when you need it.  . You will need a follow up appointment in 12 months .   Please call our office 2 months in advance to schedule this appointment.  (call in early April 2021 to schedule)  . Providers on your designated Care Team:   . Murray Hodgkins, NP . Christell Faith, PA-C . Marrianne Mood, PA-C  Any Other Special Instructions Will Be Listed Below (If Applicable).  For educational health videos Log in to : [email or web address] Or : [email or web address], password : [email or web address]

## 2018-09-04 NOTE — Telephone Encounter (Signed)
Spoke with the patient and he is scheduled for his procedure on 09/06/2018 with Dr. Delana Meyer with a 8:00 am arrival time. The patient will do his Covid testing on 09/05/2018 between 10:-12:30pm at the [facility]. Zero visitor policy and pre-procedure instructions were discussed. Patient understood.

## 2018-09-05 ENCOUNTER — Other Ambulatory Visit
Admission: RE | Admit: 2018-09-05 | Discharge: 2018-09-05 | Disposition: A | Discharge status: Home / Self Care | Payer: 59 | Source: Ambulatory Visit | Attending: Vascular Surgery | Admitting: Vascular Surgery

## 2018-09-05 ENCOUNTER — Other Ambulatory Visit: Payer: Self-pay

## 2018-09-05 DIAGNOSIS — Z1159 Encounter for screening for other viral diseases: Secondary | ICD-10-CM | POA: Insufficient documentation

## 2018-09-05 DIAGNOSIS — Z01812 Encounter for preprocedural laboratory examination: Secondary | ICD-10-CM | POA: Diagnosis not present

## 2018-09-05 LAB — SARS CORONAVIRUS 2 BY RT PCR (HOSPITAL ORDER, PERFORMED IN ~~LOC~~ HOSPITAL LAB): SARS Coronavirus 2: NEGATIVE

## 2018-09-05 MED ORDER — CEFAZOLIN SODIUM-DEXTROSE 1-4 GM/50ML-% IV SOLN
1.0000 g | Freq: Once | INTRAVENOUS | Status: AC
  Administered 2018-09-06: 1 g via INTRAVENOUS

## 2018-09-06 ENCOUNTER — Ambulatory Visit
Admission: RE | Admit: 2018-09-06 | Discharge: 2018-09-06 | Disposition: A | Discharge status: Home / Self Care | Payer: 59 | Attending: Vascular Surgery | Admitting: Vascular Surgery

## 2018-09-06 ENCOUNTER — Encounter
Admission: RE | Disposition: A | Discharge status: Home / Self Care | Payer: Self-pay | Source: Home / Self Care | Attending: Vascular Surgery

## 2018-09-06 ENCOUNTER — Other Ambulatory Visit: Payer: Self-pay

## 2018-09-06 DIAGNOSIS — T829XXA Unspecified complication of cardiac and vascular prosthetic device, implant and graft, initial encounter: Secondary | ICD-10-CM

## 2018-09-06 DIAGNOSIS — F17210 Nicotine dependence, cigarettes, uncomplicated: Secondary | ICD-10-CM | POA: Diagnosis not present

## 2018-09-06 DIAGNOSIS — Z8249 Family history of ischemic heart disease and other diseases of the circulatory system: Secondary | ICD-10-CM | POA: Diagnosis not present

## 2018-09-06 DIAGNOSIS — Z992 Dependence on renal dialysis: Secondary | ICD-10-CM | POA: Insufficient documentation

## 2018-09-06 DIAGNOSIS — N186 End stage renal disease: Secondary | ICD-10-CM | POA: Diagnosis not present

## 2018-09-06 DIAGNOSIS — I120 Hypertensive chronic kidney disease with stage 5 chronic kidney disease or end stage renal disease: Secondary | ICD-10-CM | POA: Insufficient documentation

## 2018-09-06 DIAGNOSIS — I2510 Atherosclerotic heart disease of native coronary artery without angina pectoris: Secondary | ICD-10-CM

## 2018-09-06 DIAGNOSIS — Z452 Encounter for adjustment and management of vascular access device: Secondary | ICD-10-CM | POA: Insufficient documentation

## 2018-09-06 DIAGNOSIS — F17200 Nicotine dependence, unspecified, uncomplicated: Secondary | ICD-10-CM

## 2018-09-06 HISTORY — PX: DIALYSIS/PERMA CATHETER INSERTION: CATH118288

## 2018-09-06 SURGERY — DIALYSIS/PERMA CATHETER INSERTION
Anesthesia: Moderate Sedation

## 2018-09-06 MED ORDER — FENTANYL CITRATE (PF) 100 MCG/2ML IJ SOLN
INTRAMUSCULAR | Status: DC | PRN
  Administered 2018-09-06: 50 ug via INTRAVENOUS

## 2018-09-06 MED ORDER — FAMOTIDINE 20 MG PO TABS: 40.0000 mg | ORAL_TABLET | Freq: Once | ORAL | Status: DC | PRN

## 2018-09-06 MED ORDER — ONDANSETRON HCL 4 MG/2ML IJ SOLN: 4.0000 mg | Freq: Four times a day (QID) | INTRAMUSCULAR | Status: DC | PRN

## 2018-09-06 MED ORDER — HEPARIN SODIUM (PORCINE) 10000 UNIT/ML IJ SOLN
INTRAMUSCULAR | Status: AC
  Filled 2018-09-06: qty 1

## 2018-09-06 MED ORDER — HYDROMORPHONE HCL 1 MG/ML IJ SOLN: 1.0000 mg | Freq: Once | INTRAMUSCULAR | Status: DC | PRN

## 2018-09-06 MED ORDER — SODIUM CHLORIDE 0.9 % IV SOLN: INTRAVENOUS | Status: DC

## 2018-09-06 MED ORDER — MIDAZOLAM HCL 5 MG/5ML IJ SOLN
INTRAMUSCULAR | Status: AC
  Filled 2018-09-06: qty 5

## 2018-09-06 MED ORDER — FENTANYL CITRATE (PF) 100 MCG/2ML IJ SOLN
INTRAMUSCULAR | Status: AC
  Filled 2018-09-06: qty 2

## 2018-09-06 MED ORDER — DIPHENHYDRAMINE HCL 50 MG/ML IJ SOLN: 50.0000 mg | Freq: Once | INTRAMUSCULAR | Status: DC | PRN

## 2018-09-06 MED ORDER — MIDAZOLAM HCL 2 MG/2ML IJ SOLN
INTRAMUSCULAR | Status: DC | PRN
  Administered 2018-09-06: 2 mg via INTRAVENOUS

## 2018-09-06 MED ORDER — METHYLPREDNISOLONE SODIUM SUCC 125 MG IJ SOLR: 125.0000 mg | Freq: Once | INTRAMUSCULAR | Status: DC | PRN

## 2018-09-06 MED ORDER — MIDAZOLAM HCL 2 MG/ML PO SYRP: 8.0000 mg | ORAL_SOLUTION | Freq: Once | ORAL | Status: DC | PRN

## 2018-09-06 MED ORDER — LIDOCAINE-EPINEPHRINE (PF) 1 %-1:200000 IJ SOLN
INTRAMUSCULAR | Status: AC
  Filled 2018-09-06: qty 30

## 2018-09-06 SURGICAL SUPPLY — 9 items
CATH PALINDROME-P 19CM W/VT (CATHETERS) IMPLANT
GUIDEWIRE SUPER STIFF .035X180 (WIRE) ×3 IMPLANT
NEEDLE ENTRY 21GA 7CM ECHOTIP (NEEDLE) IMPLANT
PACK ANGIOGRAPHY (CUSTOM PROCEDURE TRAY) ×3 IMPLANT
SET INTRO CAPELLA COAXIAL (SET/KITS/TRAYS/PACK) IMPLANT
SHEATH BRITE TIP 5FRX11 (SHEATH) IMPLANT
SUT SILK 0 FSL (SUTURE) ×3 IMPLANT
TUBING CONTRAST HIGH PRESS 72 (TUBING) IMPLANT
WIRE J 3MM .035X145CM (WIRE) IMPLANT

## 2018-09-06 NOTE — Op Note (Signed)
[facility]   OPERATIVE NOTE     PROCEDURE: 1. Examination of tunneled dialysis catheter under floro  PRE-OPERATIVE DIAGNOSIS: Complication of dialysis device with nonfunction of tunneled catheter; end-stage renal requiring hemodialysis  POST-OPERATIVE DIAGNOSIS: same as above  SURGEON: Katha Cabal, M.D.  ANESTHESIA: Conscious sedation was administered under my direct supervision by the interventional radiology RN.  IV Versed plus fentanyl were utilized. Continuous ECG, pulse oximetry and blood pressure was monitored throughout the entire procedure.  Conscious sedation was for a total of 20 minutes.  ESTIMATED BLOOD LOSS: Minimal  FINDING(S): 1.  Tips of the catheter in the right atrium on fluoroscopy 2.  No obvious pneumothorax on fluoroscopy  SPECIMEN(S):  none  INDICATIONS:   Geoffrey Rangel is a 58 y.o. male  presents with end stage renal disease.  Therefore, the patient requires a tunneled dialysis catheter placement.  The patient is informed of  the risks catheter placement include but are not limited to: bleeding, infection, central venous injury, pneumothorax, possible venous stenosis, possible malpositioning in the venous system, and possible infections related to long-term catheter presence.  The patient was aware of these risks and agreed to proceed.  DESCRIPTION: The patient was taken back to Special Procedure suite.  Prior to sedation, the patient was given IV antibiotics.  After obtaining adequate sedation, the patient was prepped and draped in the standard fashion for a chest or neck tunneled dialysis catheter placement.  Appropriate Time Out is called.   The the right neck and chest wall are then infiltrated with 1% Lidocaine with epinepherine.    Both ports are aspirated and there is absolutely no impediment to flow.  A 20 cc syringe is completely filled up in a matter of seconds for both lumens.  The catheter is examined under fluoroscopy.   Using x-rays I visualized the catheter from its tip to the hub's.  The tip is centered within the atrium and the catheter is free of any kinks with a smooth contour.  Catheter is resecured to the chest wall with 0 silk sutures.  Is flushed with 5000 units of heparin per lumen and a sterile dressing with a Biopatch is applied.  COMPLICATIONS: None  CONDITION: [name] 09/06/2018,9:26 AM [facility] Office: [phone]   09/06/2018, 9:26 AM

## 2018-09-06 NOTE — H&P (Signed)
[facility] Admission History & Physical  MRN : [id number]  Geoffrey Rangel is a 58 y.o. (1961-02-19) male who presents with chief complaint of No chief complaint on file. Marland Kitchen  History of Present Illness:  I am asked to evaluate the patient by the dialysis center. The patient was sent here because they were unable to achieve adequate dialysis yesterday. Furthermore the Center states they were unable to aspirate either lumen of the catheter yesterday.  This problem has been getting worse for about 1-2 weeks. The patient is unaware of any other change.   Patient denies pain or tenderness overlying the access.  There is no pain with dialysis.  Patient denies fevers or shaking chills while on dialysis.    There have multiple any past interventions and declots of his multiple different access.  The patient is not chronically hypotensive on dialysis.   Current Facility-Administered Medications  Medication Dose Route Frequency Provider Last Rate Last Dose  . 0.9 %  sodium chloride infusion   Intravenous Continuous Kris Hartmann, NP      . ceFAZolin (ANCEF) IVPB 1 g/50 mL premix  1 g Intravenous Once Kris Hartmann, NP      . diphenhydrAMINE (BENADRYL) injection 50 mg  50 mg Intravenous Once PRN Kris Hartmann, NP      . famotidine (PEPCID) tablet 40 mg  40 mg Oral Once PRN Kris Hartmann, NP      . HYDROmorphone (DILAUDID) injection 1 mg  1 mg Intravenous Once PRN Eulogio Ditch E, NP      . methylPREDNISolone sodium succinate (SOLU-MEDROL) 125 mg/2 mL injection 125 mg  125 mg Intravenous Once PRN Eulogio Ditch E, NP      . midazolam (VERSED) 2 MG/ML syrup 8 mg  8 mg Oral Once PRN Kris Hartmann, NP      . ondansetron (ZOFRAN) injection 4 mg  4 mg Intravenous Q6H PRN Kris Hartmann, NP         Past Surgical History:  Procedure Laterality Date  . DIALYSIS/PERMA CATHETER INSERTION N/A 06/19/2018   Procedure: DIALYSIS/PERMA CATHETER INSERTION;  Surgeon: Algernon Huxley, MD;   Location: [place] [facility];  Service: Cardiovascular;  Laterality: N/A;  . DIALYSIS/PERMA CATHETER INSERTION N/A 07/19/2018   Procedure: DIALYSIS/PERMA CATHETER INSERTION;  Surgeon: Katha Cabal, MD;  Location: [place] [facility];  Service: Cardiovascular;  Laterality: N/A;  . RENAL BIOPSY, PERCUTANEOUS  09/17/2016        Social History Social History   Tobacco Use  . Smoking status: Current Every Day Smoker    Packs/day: 1.00    Years: 34.00    Pack years: 34.00  . Smokeless tobacco: Former Systems developer    Types: Snuff  Substance Use Topics  . Alcohol use: Never    Frequency: Never  . Drug use: Never    Family History Family History  Problem Relation Age of Onset  . CAD Father 20  . Hypertension Father   . Chronic Renal Failure Father   . Congestive Heart Failure Sister   . Kidney disease Sister   . Cirrhosis Maternal Grandfather   . Alcohol abuse Maternal Grandfather   . Colon cancer Paternal Grandfather   . Congestive Heart Failure Paternal Grandmother     No family history of bleeding or clotting disorders, autoimmune disease or porphyria  No Known Allergies   REVIEW OF SYSTEMS (Negative unless checked)  Constitutional: [] Weight loss  [] Fever  [] Chills Cardiac: [] Chest  pain   [] Chest pressure   [] Palpitations   [] Shortness of breath when laying flat   [] Shortness of breath at rest   [x] Shortness of breath with exertion. Vascular:  [] Pain in legs with walking   [] Pain in legs at rest   [] Pain in legs when laying flat   [] Claudication   [] Pain in feet when walking  [] Pain in feet at rest  [] Pain in feet when laying flat   [] History of DVT   [] Phlebitis   [] Swelling in legs   [] Varicose veins   [] Non-healing ulcers Pulmonary:   [] Uses home oxygen   [] Productive cough   [] Hemoptysis   [] Wheeze  [] COPD   [] Asthma Neurologic:  [] Dizziness  [] Blackouts   [] Seizures   [] History of stroke   [] History of TIA  [] Aphasia   [] Temporary blindness   [] Dysphagia   [] Weakness or  numbness in arms   [] Weakness or numbness in legs Musculoskeletal:  [] Arthritis   [] Joint swelling   [] Joint pain   [] Low back pain Hematologic:  [] Easy bruising  [] Easy bleeding   [] Hypercoagulable state   [] Anemic  [] Hepatitis Gastrointestinal:  [] Blood in stool   [] Vomiting blood  [] Gastroesophageal reflux/heartburn   [] Difficulty swallowing. Genitourinary:  [x] Chronic kidney disease   [] Difficult urination  [] Frequent urination  [] Burning with urination   [] Blood in urine Skin:  [] Rashes   [] Ulcers   [] Wounds Psychological:  [] History of anxiety   []  History of major depression.  Physical Examination  There were no vitals filed for this visit. There is no height or weight on file to calculate BMI. Gen: WD/WN, NAD Head: [place]/AT, No temporalis wasting. Prominent temp pulse not noted. Ear/Nose/Throat: Hearing grossly intact, nares w/o erythema or drainage, oropharynx w/o Erythema/Exudate,  Eyes: Conjunctiva clear, sclera non-icteric Neck: Trachea midline.  No JVD.  Pulmonary:  Good air movement, respirations not labored, no use of accessory muscles.  Cardiac: RRR, normal S1, S2. Vascular: right IJ tunneled catheter without tenderness or drainage Vessel Right Left  Radial Palpable Palpable  Gastrointestinal: soft, non-tender/non-distended. No guarding/reflex.  Musculoskeletal: M/S 5/5 throughout.  Extremities without ischemic changes.  No deformity or atrophy.  Neurologic: Sensation grossly intact in extremities.  Symmetrical.  Speech is fluent. Motor exam as listed above. Psychiatric: Judgment intact, Mood & affect appropriate for pt's clinical situation. Dermatologic: No rashes or ulcers noted.  No cellulitis or open wounds. Lymph : No Cervical, Axillary, or Inguinal lymphadenopathy.   CBC Lab Results  Component Value Date   WBC 8.6 06/10/2018   HGB 12.2 (L) 06/10/2018   HCT 38.6 (L) 06/10/2018   MCV 93.0 06/10/2018   PLT 187 06/10/2018    BMET    Component Value Date/Time    NA 138 06/09/2018 0229   K 4.8 06/09/2018 0229   CL 112 (H) 06/09/2018 0229   CO2 18 (L) 06/09/2018 0229   GLUCOSE 87 06/09/2018 0229   BUN 72 (H) 06/09/2018 0229   CREATININE 5.47 (H) 06/09/2018 0229   CALCIUM 8.5 (L) 06/09/2018 0229   GFRNONAA 11 (L) 06/09/2018 0229   GFRAA 12 (L) 06/09/2018 0229   CrCl cannot be calculated (Patient's most recent lab result is older than the maximum 21 days allowed.).  COAG Lab Results  Component Value Date   INR 1.1 06/08/2018   INR 1.06 09/15/2016    Radiology No results found.  Assessment/Plan 1.  Complication dialysis device with thrombosis AV access:  Patient's right IJ tunneled catheter is thrombosed. The patient will undergo exchange of the catheter  same venous access using interventional techniques.  The risks and benefits were described to the patient.  All questions were answered.  The patient agrees to proceed with intervention.  2.  End-stage renal disease requiring hemodialysis:  Patient will continue dialysis therapy without further interruption if a successful exchange is not achieved then new site will be found for tunneled catheter placement. Dialysis has already been arranged since the patient missed their previous session 3.  Hypertension:  Patient will continue medical management; nephrology is following no changes in oral medications. 4.  Coronary artery disease:  EKG will be monitored. Nitrates will be used if needed. The patient's oral cardiac medications will be continued.    Hortencia Pilar, MD  09/06/2018 8:01 AM

## 2018-09-07 ENCOUNTER — Encounter: Payer: Self-pay | Admitting: Vascular Surgery

## 2018-09-08 ENCOUNTER — Other Ambulatory Visit: Payer: Self-pay

## 2018-09-08 ENCOUNTER — Other Ambulatory Visit (INDEPENDENT_AMBULATORY_CARE_PROVIDER_SITE_OTHER): Payer: Self-pay | Admitting: Nurse Practitioner

## 2018-09-08 ENCOUNTER — Other Ambulatory Visit
Admission: RE | Admit: 2018-09-08 | Discharge: 2018-09-08 | Disposition: A | Discharge status: Home / Self Care | Payer: 59 | Source: Ambulatory Visit | Attending: Vascular Surgery | Admitting: Vascular Surgery

## 2018-09-08 DIAGNOSIS — Z01812 Encounter for preprocedural laboratory examination: Secondary | ICD-10-CM | POA: Insufficient documentation

## 2018-09-08 DIAGNOSIS — Z1159 Encounter for screening for other viral diseases: Secondary | ICD-10-CM | POA: Insufficient documentation

## 2018-09-08 NOTE — Telephone Encounter (Signed)
Patient is scheduled with Dr. Lucky Cowboy for his Right AVF surgery. I spoke with the patient's wife and he will do Covid testing on Friday 09/08/2018 between 58:30-74:60 pm. Hospital policy and pre-procedure policy was discussed.

## 2018-09-09 LAB — NOVEL CORONAVIRUS, NAA (HOSP ORDER, SEND-OUT TO REF LAB; TAT 18-24 HRS): SARS-CoV-2, NAA: NOT DETECTED

## 2018-09-12 ENCOUNTER — Encounter
Admission: RE | Admit: 2018-09-12 | Discharge: 2018-09-12 | Disposition: A | Discharge status: Home / Self Care | Payer: 59 | Source: Ambulatory Visit | Attending: Vascular Surgery | Admitting: Vascular Surgery

## 2018-09-12 ENCOUNTER — Encounter: Payer: Self-pay | Admitting: Vascular Surgery

## 2018-09-12 ENCOUNTER — Other Ambulatory Visit: Payer: Self-pay

## 2018-09-12 ENCOUNTER — Encounter: Payer: Self-pay | Admitting: Anesthesiology

## 2018-09-12 ENCOUNTER — Telehealth (INDEPENDENT_AMBULATORY_CARE_PROVIDER_SITE_OTHER): Payer: Self-pay

## 2018-09-12 DIAGNOSIS — Z79899 Other long term (current) drug therapy: Secondary | ICD-10-CM | POA: Diagnosis not present

## 2018-09-12 DIAGNOSIS — F17210 Nicotine dependence, cigarettes, uncomplicated: Secondary | ICD-10-CM | POA: Diagnosis not present

## 2018-09-12 DIAGNOSIS — Z7982 Long term (current) use of aspirin: Secondary | ICD-10-CM | POA: Diagnosis not present

## 2018-09-12 DIAGNOSIS — I120 Hypertensive chronic kidney disease with stage 5 chronic kidney disease or end stage renal disease: Secondary | ICD-10-CM | POA: Diagnosis present

## 2018-09-12 DIAGNOSIS — Z01818 Encounter for other preprocedural examination: Secondary | ICD-10-CM | POA: Insufficient documentation

## 2018-09-12 DIAGNOSIS — E785 Hyperlipidemia, unspecified: Secondary | ICD-10-CM | POA: Diagnosis not present

## 2018-09-12 DIAGNOSIS — N186 End stage renal disease: Secondary | ICD-10-CM | POA: Insufficient documentation

## 2018-09-12 LAB — CBC WITH DIFFERENTIAL/PLATELET
Abs Immature Granulocytes: 0.05 10*3/uL (ref 0.00–0.07)
Basophils Absolute: 0.1 10*3/uL (ref 0.0–0.1)
Basophils Relative: 1 %
Eosinophils Absolute: 0.4 10*3/uL (ref 0.0–0.5)
Eosinophils Relative: 5 %
HCT: 42 % (ref 39.0–52.0)
Hemoglobin: 13.8 g/dL (ref 13.0–17.0)
Immature Granulocytes: 1 %
Lymphocytes Relative: 29 %
Lymphs Abs: 2.1 10*3/uL (ref 0.7–4.0)
MCH: 31 pg (ref 26.0–34.0)
MCHC: 32.9 g/dL (ref 30.0–36.0)
MCV: 94.4 fL (ref 80.0–100.0)
Monocytes Absolute: 0.6 10*3/uL (ref 0.1–1.0)
Monocytes Relative: 9 %
Neutro Abs: 3.9 10*3/uL (ref 1.7–7.7)
Neutrophils Relative %: 55 %
Platelets: 179 10*3/uL (ref 150–400)
RBC: 4.45 MIL/uL (ref 4.22–5.81)
RDW: 13.4 % (ref 11.5–15.5)
WBC: 7 10*3/uL (ref 4.0–10.5)
nRBC: 0 % (ref 0.0–0.2)

## 2018-09-12 LAB — PROTIME-INR
INR: 1 (ref 0.8–1.2)
Prothrombin Time: 13 seconds (ref 11.4–15.2)

## 2018-09-12 LAB — BASIC METABOLIC PANEL
Anion gap: 13 (ref 5–15)
BUN: 35 mg/dL — ABNORMAL HIGH (ref 6–20)
CO2: 25 mmol/L (ref 22–32)
Calcium: 9 mg/dL (ref 8.9–10.3)
Chloride: 100 mmol/L (ref 98–111)
Creatinine, Ser: 4.11 mg/dL — ABNORMAL HIGH (ref 0.61–1.24)
GFR calc Af Amer: 18 mL/min — ABNORMAL LOW (ref >60)
GFR calc non Af Amer: 15 mL/min — ABNORMAL LOW (ref >60)
Glucose, Bld: 94 mg/dL (ref 70–99)
Potassium: 4 mmol/L (ref 3.5–5.1)
Sodium: 138 mmol/L (ref 135–145)

## 2018-09-12 LAB — TYPE AND SCREEN
ABO/RH(D): O POS
Antibody Screen: NEGATIVE

## 2018-09-12 LAB — APTT: aPTT: 30 seconds (ref 24–36)

## 2018-09-12 MED ORDER — CEFAZOLIN SODIUM-DEXTROSE 1-4 GM/50ML-% IV SOLN
1.0000 g | INTRAVENOUS | Status: AC
  Administered 2018-09-13: 1 g via INTRAVENOUS

## 2018-09-12 NOTE — Patient Instructions (Signed)
Your procedure is scheduled on: Tomorrow Report to Day Surgery. To find out your arrival time please call [phone] between 1PM - 3PM on Today.  Remember: Instructions that are not followed completely may result in serious medical risk,  up to and including death, or upon the discretion of your surgeon and anesthesiologist your  surgery may need to be rescheduled.     _X__ 1. Do not eat food after midnight the night before your procedure.                 No gum chewing or hard candies. You may drink clear liquids up to 2 hours                 before you are scheduled to arrive for your surgery- DO not drink clear                 liquids within 2 hours of the start of your surgery.                 Clear Liquids include:  water, apple juice without pulp, clear carbohydrate                 drink such as Clearfast of Gatorade, Black Coffee or Tea (Do not add                 anything to coffee or tea).  __X__2.  On the morning of surgery brush your teeth with toothpaste and water, you                may rinse your mouth with mouthwash if you wish.  Do not swallow any toothpaste of mouthwash.     _X__ 3.  No Alcohol for 24 hours before or after surgery.   _X__ 4.  Do Not Smoke or use e-cigarettes For 24 Hours Prior to Your Surgery.                 Do not use any chewable tobacco products for at least 6 hours prior to                 surgery.  ____  5.  Bring all medications with you on the day of surgery if instructed.   __x__  6.  Notify your doctor if there is any change in your medical condition      (cold, fever, infections).     Do not wear jewelry, make-up, hairpins, clips or nail polish. Do not wear lotions, powders, or perfumes. You may wear deodorant. Do not shave 48 hours prior to surgery. Men may shave face and neck. Do not bring valuables to the hospital.    [facility] is not responsible for any belongings or valuables.  Contacts, dentures or  bridgework may not be worn into surgery. Leave your suitcase in the car. After surgery it may be brought to your room. For patients admitted to the hospital, discharge time is determined by your treatment team.   Patients discharged the day of surgery will not be allowed to drive home.   Please read over the following fact sheets that you were given:    __x__ Take these medicines the morning of surgery with A SIP OF WATER:    1. isosorbide mononitrate (IMDUR) 30 MG 24 hr tablet  2.   3.   4.  5.  6.  ____ Fleet Enema (as directed)   _x___ Use Sage wipes as directed  _x___ Use  inhalers on the day of surgeryalbuterol (VENTOLIN HFA) 108 (90 Base) MCG/ACT inhaler and bring with you to the hospital  ____ Stop metformin 2 days prior to surgery    ____ Take 1/2 of usual insulin dose the night before surgery. No insulin the morning          of surgery.   __x__ Stop aspirin on today  ____ Stop Anti-inflammatories on    ____ Stop supplements until after surgery.    ____ Bring C-Pap to the hospital.

## 2018-09-12 NOTE — Telephone Encounter (Signed)
Celeste from [facility] wanted to include a permcath exchange with the patient's surgery for tomorrow. Per Dr. Lucky Cowboy that will not be something that can be done at that time. Celeste was given Dr. Bunnie Domino recommendation.

## 2018-09-13 ENCOUNTER — Encounter
Admission: RE | Disposition: A | Discharge status: Home / Self Care | Payer: Self-pay | Source: Home / Self Care | Attending: Vascular Surgery

## 2018-09-13 ENCOUNTER — Ambulatory Visit: Payer: 59 | Admitting: Anesthesiology

## 2018-09-13 ENCOUNTER — Other Ambulatory Visit: Payer: Self-pay

## 2018-09-13 ENCOUNTER — Ambulatory Visit
Admission: RE | Admit: 2018-09-13 | Discharge: 2018-09-13 | Disposition: A | Discharge status: Home / Self Care | Payer: 59 | Attending: Vascular Surgery | Admitting: Vascular Surgery

## 2018-09-13 DIAGNOSIS — Z79899 Other long term (current) drug therapy: Secondary | ICD-10-CM | POA: Insufficient documentation

## 2018-09-13 DIAGNOSIS — Z7982 Long term (current) use of aspirin: Secondary | ICD-10-CM | POA: Insufficient documentation

## 2018-09-13 DIAGNOSIS — F17210 Nicotine dependence, cigarettes, uncomplicated: Secondary | ICD-10-CM | POA: Insufficient documentation

## 2018-09-13 DIAGNOSIS — N186 End stage renal disease: Secondary | ICD-10-CM

## 2018-09-13 DIAGNOSIS — F17200 Nicotine dependence, unspecified, uncomplicated: Secondary | ICD-10-CM

## 2018-09-13 DIAGNOSIS — E785 Hyperlipidemia, unspecified: Secondary | ICD-10-CM | POA: Insufficient documentation

## 2018-09-13 DIAGNOSIS — E1122 Type 2 diabetes mellitus with diabetic chronic kidney disease: Secondary | ICD-10-CM

## 2018-09-13 DIAGNOSIS — Z992 Dependence on renal dialysis: Secondary | ICD-10-CM

## 2018-09-13 DIAGNOSIS — I120 Hypertensive chronic kidney disease with stage 5 chronic kidney disease or end stage renal disease: Secondary | ICD-10-CM | POA: Diagnosis not present

## 2018-09-13 HISTORY — PX: AV FISTULA PLACEMENT: SHX1204

## 2018-09-13 LAB — POCT I-STAT 4, (NA,K, GLUC, HGB,HCT)
Glucose, Bld: 103 mg/dL — ABNORMAL HIGH (ref 70–99)
HCT: 43 % (ref 39.0–52.0)
Hemoglobin: 14.6 g/dL (ref 13.0–17.0)
Potassium: 4.4 mmol/L (ref 3.5–5.1)
Sodium: 136 mmol/L (ref 135–145)

## 2018-09-13 SURGERY — ARTERIOVENOUS (AV) FISTULA CREATION
Anesthesia: General | Site: Arm Upper | Laterality: Right

## 2018-09-13 MED ORDER — FENTANYL CITRATE (PF) 100 MCG/2ML IJ SOLN
25.0000 ug | INTRAMUSCULAR | Status: DC | PRN
  Administered 2018-09-13 (×5): 25 ug via INTRAVENOUS

## 2018-09-13 MED ORDER — HYDROCODONE-ACETAMINOPHEN 5-325 MG PO TABS: 1.0000 | ORAL_TABLET | Freq: Four times a day (QID) | ORAL | 0 refills | Status: DC | PRN

## 2018-09-13 MED ORDER — DEXAMETHASONE SODIUM PHOSPHATE 10 MG/ML IJ SOLN
INTRAMUSCULAR | Status: AC
  Filled 2018-09-13: qty 1

## 2018-09-13 MED ORDER — SODIUM CHLORIDE 0.9 % IV SOLN
INTRAVENOUS | Status: DC
  Administered 2018-09-13: 500 mL via INTRAVENOUS

## 2018-09-13 MED ORDER — HEPARIN SODIUM (PORCINE) 1000 UNIT/ML IJ SOLN
INTRAMUSCULAR | Status: DC | PRN
  Administered 2018-09-13: 3000 [IU] via INTRAVENOUS

## 2018-09-13 MED ORDER — FENTANYL CITRATE (PF) 100 MCG/2ML IJ SOLN
INTRAMUSCULAR | Status: DC | PRN
  Administered 2018-09-13 (×4): 50 ug via INTRAVENOUS

## 2018-09-13 MED ORDER — ONDANSETRON HCL 4 MG/2ML IJ SOLN
INTRAMUSCULAR | Status: AC
  Administered 2018-09-13: 4 mg via INTRAVENOUS
  Filled 2018-09-13: qty 2

## 2018-09-13 MED ORDER — CHLORHEXIDINE GLUCONATE CLOTH 2 % EX PADS: 6.0000 | MEDICATED_PAD | Freq: Once | CUTANEOUS | Status: DC

## 2018-09-13 MED ORDER — ONDANSETRON HCL 4 MG/2ML IJ SOLN
INTRAMUSCULAR | Status: DC | PRN
  Administered 2018-09-13: 4 mg via INTRAVENOUS

## 2018-09-13 MED ORDER — LIDOCAINE 2% (20 MG/ML) 5 ML SYRINGE
INTRAMUSCULAR | Status: DC | PRN
  Administered 2018-09-13: 100 mg via INTRAVENOUS

## 2018-09-13 MED ORDER — HEPARIN SODIUM (PORCINE) 5000 UNIT/ML IJ SOLN
INTRAMUSCULAR | Status: AC
  Filled 2018-09-13: qty 1

## 2018-09-13 MED ORDER — FAMOTIDINE 20 MG PO TABS
20.0000 mg | ORAL_TABLET | Freq: Once | ORAL | Status: AC
  Administered 2018-09-13: 20 mg via ORAL

## 2018-09-13 MED ORDER — BUPIVACAINE-EPINEPHRINE (PF) 0.5% -1:200000 IJ SOLN
INTRAMUSCULAR | Status: AC
  Filled 2018-09-13: qty 30

## 2018-09-13 MED ORDER — MIDAZOLAM HCL 2 MG/2ML IJ SOLN
INTRAMUSCULAR | Status: AC
  Filled 2018-09-13: qty 2

## 2018-09-13 MED ORDER — HYDROMORPHONE HCL 1 MG/ML IJ SOLN
0.2500 mg | INTRAMUSCULAR | Status: DC | PRN
  Administered 2018-09-13: 0.5 mg via INTRAVENOUS

## 2018-09-13 MED ORDER — PROPOFOL 500 MG/50ML IV EMUL
INTRAVENOUS | Status: AC
  Filled 2018-09-13: qty 50

## 2018-09-13 MED ORDER — CHLORHEXIDINE GLUCONATE CLOTH 2 % EX PADS
6.0000 | MEDICATED_PAD | Freq: Once | CUTANEOUS | Status: AC
  Administered 2018-09-13: 6 via TOPICAL

## 2018-09-13 MED ORDER — DEXAMETHASONE SODIUM PHOSPHATE 10 MG/ML IJ SOLN
INTRAMUSCULAR | Status: DC | PRN
  Administered 2018-09-13: 10 mg via INTRAVENOUS
  Administered 2018-09-13: 5 mg via INTRAVENOUS

## 2018-09-13 MED ORDER — FENTANYL CITRATE (PF) 100 MCG/2ML IJ SOLN
INTRAMUSCULAR | Status: AC
  Filled 2018-09-13: qty 2

## 2018-09-13 MED ORDER — FENTANYL CITRATE (PF) 100 MCG/2ML IJ SOLN
INTRAMUSCULAR | Status: AC
  Administered 2018-09-13: 25 ug via INTRAVENOUS
  Filled 2018-09-13: qty 2

## 2018-09-13 MED ORDER — FAMOTIDINE 20 MG PO TABS
ORAL_TABLET | ORAL | Status: AC
  Filled 2018-09-13: qty 1

## 2018-09-13 MED ORDER — MIDAZOLAM HCL 5 MG/5ML IJ SOLN
INTRAMUSCULAR | Status: DC | PRN
  Administered 2018-09-13: 2 mg via INTRAVENOUS

## 2018-09-13 MED ORDER — SODIUM CHLORIDE 0.9 % IV SOLN
INTRAVENOUS | Status: DC | PRN
  Administered 2018-09-13: 12:00:00 via INTRAVENOUS

## 2018-09-13 MED ORDER — PHENYLEPHRINE HCL (PRESSORS) 10 MG/ML IV SOLN
INTRAVENOUS | Status: DC | PRN
  Administered 2018-09-13 (×4): 100 ug via INTRAVENOUS
  Administered 2018-09-13: 200 ug via INTRAVENOUS

## 2018-09-13 MED ORDER — PROPOFOL 10 MG/ML IV BOLUS
INTRAVENOUS | Status: DC | PRN
  Administered 2018-09-13: 200 mg via INTRAVENOUS

## 2018-09-13 MED ORDER — CEFAZOLIN SODIUM-DEXTROSE 1-4 GM/50ML-% IV SOLN
INTRAVENOUS | Status: AC
  Filled 2018-09-13: qty 50

## 2018-09-13 MED ORDER — SODIUM CHLORIDE 0.9 % IV SOLN
INTRAVENOUS | Status: DC | PRN
  Administered 2018-09-13: 500 mL via INTRAMUSCULAR

## 2018-09-13 MED ORDER — ONDANSETRON HCL 4 MG/2ML IJ SOLN
4.0000 mg | Freq: Once | INTRAMUSCULAR | Status: AC | PRN
  Administered 2018-09-13: 4 mg via INTRAVENOUS

## 2018-09-13 MED ORDER — HYDROMORPHONE HCL 1 MG/ML IJ SOLN
INTRAMUSCULAR | Status: AC
  Administered 2018-09-13: 0.5 mg via INTRAVENOUS
  Filled 2018-09-13: qty 1

## 2018-09-13 SURGICAL SUPPLY — 53 items
BAG DECANTER FOR FLEXI CONT (MISCELLANEOUS) ×3 IMPLANT
BLADE SURG SZ11 CARB STEEL (BLADE) ×3 IMPLANT
BOOT SUTURE AID YELLOW STND (SUTURE) ×3 IMPLANT
BRUSH SCRUB EZ  4% CHG (MISCELLANEOUS) ×2
BRUSH SCRUB EZ 4% CHG (MISCELLANEOUS) ×1 IMPLANT
CANISTER SUCT 1200ML W/VALVE (MISCELLANEOUS) ×3 IMPLANT
CHLORAPREP W/TINT 26 (MISCELLANEOUS) ×3 IMPLANT
CLIP SPRNG 6 S-JAW DBL (CLIP) ×1 IMPLANT
CLIP SPRNG 6MM S-JAW DBL (CLIP) ×3
COVER WAND RF STERILE (DRAPES) ×3 IMPLANT
DERMABOND ADVANCED (GAUZE/BANDAGES/DRESSINGS) ×2
DERMABOND ADVANCED .7 DNX12 (GAUZE/BANDAGES/DRESSINGS) ×1 IMPLANT
ELECT CAUTERY BLADE 6.4 (BLADE) ×3 IMPLANT
ELECT REM PT RETURN 9FT ADLT (ELECTROSURGICAL) ×3
ELECTRODE REM PT RTRN 9FT ADLT (ELECTROSURGICAL) ×1 IMPLANT
GEL ULTRASOUND 20GR AQUASONIC (MISCELLANEOUS) IMPLANT
GLOVE BIO SURGEON STRL SZ7 (GLOVE) ×10 IMPLANT
GLOVE INDICATOR 7.5 STRL GRN (GLOVE) ×5 IMPLANT
GOWN STRL REUS W/ TWL LRG LVL3 (GOWN DISPOSABLE) ×2 IMPLANT
GOWN STRL REUS W/ TWL XL LVL3 (GOWN DISPOSABLE) ×1 IMPLANT
GOWN STRL REUS W/TWL LRG LVL3 (GOWN DISPOSABLE) ×4
GOWN STRL REUS W/TWL XL LVL3 (GOWN DISPOSABLE) ×2
HEMOSTAT SURGICEL 2X3 (HEMOSTASIS) ×3 IMPLANT
IV NS 500ML (IV SOLUTION) ×2
IV NS 500ML BAXH (IV SOLUTION) ×1 IMPLANT
KIT TURNOVER KIT A (KITS) ×3 IMPLANT
LABEL OR SOLS (LABEL) ×3 IMPLANT
LOOP RED MAXI  1X406MM (MISCELLANEOUS) ×2
LOOP VESSEL MAXI 1X406 RED (MISCELLANEOUS) ×1 IMPLANT
LOOP VESSEL MINI 0.8X406 BLUE (MISCELLANEOUS) ×1 IMPLANT
LOOPS BLUE MINI 0.8X406MM (MISCELLANEOUS) ×2
NDL FILTER BLUNT 18X1 1/2 (NEEDLE) ×1 IMPLANT
NEEDLE FILTER BLUNT 18X 1/2SAF (NEEDLE) ×2
NEEDLE FILTER BLUNT 18X1 1/2 (NEEDLE) ×1 IMPLANT
NS IRRIG 500ML POUR BTL (IV SOLUTION) ×3 IMPLANT
PACK EXTREMITY ARMC (MISCELLANEOUS) ×3 IMPLANT
PAD PREP 24X41 OB/GYN DISP (PERSONAL CARE ITEMS) ×3 IMPLANT
SOLUTION CELL SAVER (CLIP) ×1 IMPLANT
STOCKINETTE 48X4 2 PLY STRL (GAUZE/BANDAGES/DRESSINGS) ×1 IMPLANT
STOCKINETTE STRL 4IN 9604848 (GAUZE/BANDAGES/DRESSINGS) ×3 IMPLANT
SUT MNCRL AB 4-0 PS2 18 (SUTURE) ×3 IMPLANT
SUT PROLENE 6 0 BV (SUTURE) ×6 IMPLANT
SUT SILK 2 0 (SUTURE) ×2
SUT SILK 2-0 18XBRD TIE 12 (SUTURE) ×1 IMPLANT
SUT SILK 3 0 (SUTURE) ×2
SUT SILK 3-0 18XBRD TIE 12 (SUTURE) ×1 IMPLANT
SUT SILK 4 0 (SUTURE) ×2
SUT SILK 4-0 18XBRD TIE 12 (SUTURE) ×1 IMPLANT
SUT VIC AB 3-0 SH 27 (SUTURE) ×2
SUT VIC AB 3-0 SH 27X BRD (SUTURE) ×1 IMPLANT
SYR 20CC LL (SYRINGE) ×3 IMPLANT
SYR 3ML LL SCALE MARK (SYRINGE) ×3 IMPLANT
SYR TB 1ML 27GX1/2 LL (SYRINGE) IMPLANT

## 2018-09-13 NOTE — H&P (Signed)
[facility] Admission History & Physical  MRN : [id number]  Geoffrey Rangel is a 58 y.o. (March 26, 1961) male who presents with chief complaint of No chief complaint on file. Marland Kitchen  History of Present Illness: Patient presents today for placement of right arm AVF. Recently started HD earlier this year. Using a Permcath currently.  No complaints today.  No fever or chills or signs of infection  Current Facility-Administered Medications  Medication Dose Route Frequency Provider Last Rate Last Dose  . 0.9 %  sodium chloride infusion   Intravenous Continuous Molli Barrows, MD 10 mL/hr at 09/13/18 1013 500 mL at 09/13/18 1013  . ceFAZolin (ANCEF) 1-4 GM/50ML-% IVPB           . ceFAZolin (ANCEF) IVPB 1 g/50 mL premix  1 g Intravenous On Call to [place], [place], NP      . Chlorhexidine Gluconate Cloth 2 % PADS 6 each  6 each Topical Once Kris Hartmann, NP      . famotidine (PEPCID) 20 MG tablet             Past Medical History:  Diagnosis Date  . Arthritis   . Chronic kidney disease   . Dyspnea    occasional  . GERD (gastroesophageal reflux disease)   . Hyperlipidemia   . Hypertension   . Renal disorder     Past Surgical History:  Procedure Laterality Date  . DIALYSIS/PERMA CATHETER INSERTION N/A 06/19/2018   Procedure: DIALYSIS/PERMA CATHETER INSERTION;  Surgeon: Algernon Huxley, MD;  Location: [facility];  Service: Cardiovascular;  Laterality: N/A;  . DIALYSIS/PERMA CATHETER INSERTION N/A 07/19/2018   Procedure: DIALYSIS/PERMA CATHETER INSERTION;  Surgeon: Katha Cabal, MD;  Location: [place] [facility];  Service: Cardiovascular;  Laterality: N/A;  . DIALYSIS/PERMA CATHETER INSERTION N/A 09/06/2018   Procedure: DIALYSIS/PERMA CATHETER INSERTION;  Surgeon: Katha Cabal, MD;  Location: [facility];  Service: Cardiovascular;  Laterality: N/A;  . RENAL BIOPSY, PERCUTANEOUS  09/17/2016        Social History Social History   Tobacco  Use  . Smoking status: Current Every Day Smoker    Packs/day: 1.00    Years: 34.00    Pack years: 34.00  . Smokeless tobacco: Former Systems developer    Types: Snuff    Quit date: 09/12/2015  Substance Use Topics  . Alcohol use: Never    Frequency: Never  . Drug use: Never    Family History Family History  Problem Relation Age of Onset  . CAD Father 91  . Hypertension Father   . Chronic Renal Failure Father   . Congestive Heart Failure Sister   . Kidney disease Sister   . Cirrhosis Maternal Grandfather   . Alcohol abuse Maternal Grandfather   . Colon cancer Paternal Grandfather   . Congestive Heart Failure Paternal Grandmother     No Known Allergies   REVIEW OF SYSTEMS (Negative unless checked)  Constitutional: [] Weight loss  [] Fever  [] Chills Cardiac: [] Chest pain   [] Chest pressure   [] Palpitations   [] Shortness of breath when laying flat   [] Shortness of breath at rest   [] Shortness of breath with exertion. Vascular:  [] Pain in legs with walking   [] Pain in legs at rest   [] Pain in legs when laying flat   [] Claudication   [] Pain in feet when walking  [] Pain in feet at rest  [] Pain in feet when laying flat   [] History of DVT   []   Phlebitis   [] Swelling in legs   [] Varicose veins   [] Non-healing ulcers Pulmonary:   [] Uses home oxygen   [] Productive cough   [] Hemoptysis   [] Wheeze  [] COPD   [] Asthma Neurologic:  [] Dizziness  [] Blackouts   [] Seizures   [] History of stroke   [] History of TIA  [] Aphasia   [] Temporary blindness   [] Dysphagia   [] Weakness or numbness in arms   [] Weakness or numbness in legs Musculoskeletal:  [x] Arthritis   [] Joint swelling   [] Joint pain   [] Low back pain Hematologic:  [] Easy bruising  [] Easy bleeding   [] Hypercoagulable state   [] Anemic  [] Hepatitis Gastrointestinal:  [] Blood in stool   [] Vomiting blood  [x] Gastroesophageal reflux/heartburn   [] Difficulty swallowing. Genitourinary:  [x] Chronic kidney disease   [] Difficult urination  [] Frequent urination   [] Burning with urination   [] Blood in urine Skin:  [] Rashes   [] Ulcers   [] Wounds Psychological:  [] History of anxiety   []  History of major depression.  Physical Examination  Vitals:   09/13/18 1018 09/13/18 1020  BP:  137/86  Pulse: 80   Resp: 17   Temp: 98 F (36.7 C)   TempSrc: Temporal   SpO2: 100%   Weight: 86.2 kg   Height: 6\' 1"  (1.854 m)    Body mass index is 25.07 kg/m. Gen: WD/WN, NAD Head: [place]/AT, No temporalis wasting.  Ear/Nose/Throat: Hearing grossly intact, nares w/o erythema or drainage, oropharynx w/o Erythema/Exudate,  Eyes: Conjunctiva clear, sclera non-icteric Neck: Trachea midline.  No JVD.  Pulmonary:  Good air movement, respirations not labored, no use of accessory muscles.  Cardiac: RRR, normal S1, S2. Vascular: Allen's test normal right arm. Permcath present right chest Vessel Right Left  Radial Palpable Palpable   Musculoskeletal: M/S 5/5 throughout.  Extremities without ischemic changes.  No deformity or atrophy.  Neurologic: Sensation grossly intact in extremities.  Symmetrical.  Speech is fluent. Motor exam as listed above. Psychiatric: Judgment intact, Mood & affect appropriate for pt's clinical situation. Dermatologic: No rashes or ulcers noted.  No cellulitis or open wounds.      CBC Lab Results  Component Value Date   WBC 7.0 09/12/2018   HGB 14.6 09/13/2018   HCT 43.0 09/13/2018   MCV 94.4 09/12/2018   PLT 179 09/12/2018    BMET    Component Value Date/Time   NA 136 09/13/2018 1015   K 4.4 09/13/2018 1015   CL 100 09/12/2018 1124   CO2 25 09/12/2018 1124   GLUCOSE 103 (H) 09/13/2018 1015   BUN 35 (H) 09/12/2018 1124   CREATININE 4.11 (H) 09/12/2018 1124   CALCIUM 9.0 09/12/2018 1124   GFRNONAA 15 (L) 09/12/2018 1124   GFRAA 18 (L) 09/12/2018 1124   Estimated Creatinine Clearance: 22.7 mL/min (A) (by C-G formula based on SCr of 4.11 mg/dL (H)).  COAG Lab Results  Component Value Date   INR 1.0 09/12/2018   INR 1.1  06/08/2018   INR 1.06 09/15/2016    Radiology No results found.   Assessment/Plan 1. ESRD.  For AVF creation today on the right arm.  Risks and benefits discussed and patient agreeable to proceed. 2. Hypertension. Likely an underlying cause of his ESRD and blood pressure control important in reducing the progression of atherosclerotic disease. On appropriate oral medications. 3. Hyperlipidemia.  lipid control important in reducing the progression of atherosclerotic disease. Continue statin therapy    Leotis Pain, MD  09/13/2018 11:22 AM

## 2018-09-13 NOTE — Pre-Procedure Instructions (Signed)
Met B results sent to Dr. Lucky Cowboy for review.

## 2018-09-13 NOTE — Anesthesia Preprocedure Evaluation (Signed)
Anesthesia Evaluation  Patient identified by MRN, date of birth, ID band Patient awake    Reviewed: Allergy & Precautions, NPO status , Patient's Chart, lab work & pertinent test results, reviewed documented beta blocker date and time   Airway Mallampati: III  TM Distance: >3 FB     Dental  (+) Chipped   Pulmonary shortness of breath, Current Smoker,           Cardiovascular hypertension, Pt. on medications      Neuro/Psych    GI/Hepatic GERD  Controlled,  Endo/Other    Renal/GU ESRFRenal disease     Musculoskeletal  (+) Arthritis ,   Abdominal   Peds  Hematology   Anesthesia Other Findings EKG checked and ok. Echo shows 60-65.  Reproductive/Obstetrics                             Anesthesia Physical Anesthesia Plan  ASA: III  Anesthesia Plan: General   Post-op Pain Management:    Induction: Intravenous  PONV Risk Score and Plan:   Airway Management Planned: LMA  Additional Equipment:   Intra-op Plan:   Post-operative Plan:   Informed Consent: I have reviewed the patients History and Physical, chart, labs and discussed the procedure including the risks, benefits and alternatives for the proposed anesthesia with the patient or authorized representative who has indicated his/her understanding and acceptance.       Plan Discussed with: CRNA  Anesthesia Plan Comments:         Anesthesia Quick Evaluation

## 2018-09-13 NOTE — Op Note (Signed)
Geoffrey Rangel   OPERATIVE NOTE   PROCEDURE: Right brachiocephalic arteriovenous fistula placement  PRE-OPERATIVE DIAGNOSIS: 1.  ESRD        POST-OPERATIVE DIAGNOSIS: 1. ESRD       SURGEON: Geoffrey Pain, MD  ASSISTANT(S): Geoffrey Bump, PA-C  ANESTHESIA: general  ESTIMATED BLOOD LOSS: 15 cc  FINDING(S): Adequate cephalic vein for fistula creation  SPECIMEN(S):  none  INDICATIONS:   Geoffrey Rangel is a 58 y.o. male who presents with renal failure in need of pemanent dialysis acces.  The patient is scheduled for right arm AVF placement.  The patient is aware the risks include but are not limited to: bleeding, infection, steal syndrome, nerve damage, ischemic monomelic neuropathy, failure to mature, and need for additional procedures.  The patient is aware of the risks of the procedure and elects to proceed forward. An assistant was present during the procedure to help facilitate the exposure and expedite the procedure.  DESCRIPTION: After full informed written consent was obtained from the patient, the patient was brought back to the operating room and placed supine upon the operating table.  Prior to induction, the patient received IV antibiotics. The assistant provided retraction and mobilization to help facilitate exposure and expedite the procedure throughout the entire procedure.  This included following suture, using retractors, and optimizing lighting.   After obtaining adequate anesthesia, the patient was then prepped and draped in the standard fashion for a right arm access procedure.  I made a curvilinear incision at the level of the antecubital fossa and dissected through the subcutaneous tissue and fascia to gain exposure of the brachial artery.  This was noted to be patent and adequate in size for fistula creation.  This was dissected out proximally and distally and prepared for control with vessel loops .  I then dissected out the cephalic vein.  This was  noted to be patent and adequate in size for fistula creation.  I then gave the patient 3000 units of intravenous heparin.  The vein was marked for orientation and the distal segment of the vein was ligated with a  2-0 silk, and the vein was transected.  I then instilled the heparinized saline into the vein and clamped it.  At this point, I reset my exposure of the brachial artery and pulled up control on the vessel loops.  I made an arteriotomy with a #11 blade, and then I extended the arteriotomy with a Potts scissor.  I injected heparinized saline proximal and distal to this arteriotomy.  The vein was then sewn to the artery in an end-to-side configuration with a running stitch of 6-0 Prolene.  Prior to completing this anastomosis, I allowed the vein and artery to backbleed.  There was no evidence of clot from any vessels.  I completed the anastomosis in the usual fashion and then released all vessel loops and clamps.  There was a palpable  thrill in the venous outflow, and there was a palpable pulse in the artery distal to the anastomosis.  At this point, I irrigated out the surgical wound.  Surgicel was placed. There was no further active bleeding.  The subcutaneous tissue was reapproximated with a running stitch of 3-0 Vicryl.  The skin was then closed with a 4-0 Monocryl suture.  The skin was then cleaned, dried, and reinforced with Dermabond.  The patient tolerated this procedure well and was taken to the recovery room in stable condition  COMPLICATIONS: None  CONDITION: Stable   Geoffrey Rangel      09/13/2018, 1:26 PM  This note was created with Dragon Medical transcription system. Any errors in dictation are purely unintentional.

## 2018-09-13 NOTE — Anesthesia Procedure Notes (Signed)
Procedure Name: LMA Insertion Date/Time: 09/13/2018 12:08 PM Performed by: Marsh Dolly, CRNA Pre-anesthesia Checklist: Patient identified, Patient being monitored, Timeout performed, Emergency Drugs available and Suction available Patient Re-evaluated:Patient Re-evaluated prior to induction Oxygen Delivery Method: Circle system utilized Preoxygenation: Pre-oxygenation with 100% oxygen Induction Type: IV induction Ventilation: Mask ventilation without difficulty LMA: LMA inserted LMA Size: 4.5 Tube type: Oral Number of attempts: 1 Placement Confirmation: positive ETCO2 and breath sounds checked- equal and bilateral Tube secured with: Tape Dental Injury: Teeth and Oropharynx as per pre-operative assessment

## 2018-09-13 NOTE — Discharge Instructions (Signed)

## 2018-09-13 NOTE — Anesthesia Postprocedure Evaluation (Signed)
Anesthesia Post Note  Patient: Geoffrey Rangel  Procedure(s) Performed: ARTERIOVENOUS (AV) FISTULA CREATION ( BRACHIAL CEPHALIC ) (Right Arm Upper)  Patient location during evaluation: PACU Anesthesia Type: General Level of consciousness: awake and alert Pain management: pain level controlled Vital Signs Assessment: post-procedure vital signs reviewed and stable Respiratory status: spontaneous breathing, nonlabored ventilation, respiratory function stable and patient connected to nasal cannula oxygen Cardiovascular status: blood pressure returned to baseline and stable Postop Assessment: no apparent nausea or vomiting Anesthetic complications: no     Last Vitals:  Vitals:   09/13/18 1355 09/13/18 1400  BP:    Pulse: 77 80  Resp: 10 20  Temp:    SpO2: 94% 96%    Last Pain:  Vitals:   09/13/18 1414  TempSrc:   PainSc: Asleep                 Honest Safranek S

## 2018-09-13 NOTE — Transfer of Care (Signed)
Immediate Anesthesia Transfer of Care Note  Patient: Geoffrey Rangel  Procedure(s) Performed: ARTERIOVENOUS (AV) FISTULA CREATION ( BRACHIAL CEPHALIC ) (Right Arm Upper)  Patient Location: PACU  Anesthesia Type:General  Level of Consciousness: awake, alert  and oriented  Airway & Oxygen Therapy: Patient Spontanous Breathing and Patient connected to face mask oxygen  Post-op Assessment: Report given to RN and Post -op Vital signs reviewed and stable  Post vital signs: Reviewed and stable  Last Vitals:  Vitals Value Taken Time  BP 140/91 09/13/18 1335  Temp    Pulse 81 09/13/18 1338  Resp 15 09/13/18 1338  SpO2 99 % 09/13/18 1338  Vitals shown include unvalidated device data.  Last Pain:  Vitals:   09/13/18 1335  TempSrc:   PainSc: Asleep         Complications: No apparent anesthesia complications

## 2018-09-13 NOTE — Anesthesia Post-op Follow-up Note (Signed)
Anesthesia QCDR form completed.        

## 2018-09-14 ENCOUNTER — Encounter: Payer: Self-pay | Admitting: Vascular Surgery

## 2018-09-25 ENCOUNTER — Ambulatory Visit (INDEPENDENT_AMBULATORY_CARE_PROVIDER_SITE_OTHER): Payer: 59 | Admitting: Vascular Surgery

## 2018-09-27 ENCOUNTER — Other Ambulatory Visit: Payer: Self-pay | Admitting: Internal Medicine

## 2018-10-02 ENCOUNTER — Ambulatory Visit (INDEPENDENT_AMBULATORY_CARE_PROVIDER_SITE_OTHER): Payer: 59 | Admitting: Vascular Surgery

## 2018-10-02 ENCOUNTER — Encounter (INDEPENDENT_AMBULATORY_CARE_PROVIDER_SITE_OTHER): Payer: Self-pay | Admitting: Vascular Surgery

## 2018-10-02 ENCOUNTER — Other Ambulatory Visit: Payer: Self-pay

## 2018-10-02 VITALS — BP 162/72 | HR 78 | Resp 16 | Wt 190.0 lb

## 2018-10-02 DIAGNOSIS — Z992 Dependence on renal dialysis: Secondary | ICD-10-CM

## 2018-10-02 DIAGNOSIS — E782 Mixed hyperlipidemia: Secondary | ICD-10-CM

## 2018-10-02 DIAGNOSIS — I70213 Atherosclerosis of native arteries of extremities with intermittent claudication, bilateral legs: Secondary | ICD-10-CM

## 2018-10-02 DIAGNOSIS — I120 Hypertensive chronic kidney disease with stage 5 chronic kidney disease or end stage renal disease: Secondary | ICD-10-CM

## 2018-10-02 DIAGNOSIS — I10 Essential (primary) hypertension: Secondary | ICD-10-CM

## 2018-10-02 DIAGNOSIS — Z79899 Other long term (current) drug therapy: Secondary | ICD-10-CM

## 2018-10-02 DIAGNOSIS — N186 End stage renal disease: Secondary | ICD-10-CM

## 2018-10-02 NOTE — Progress Notes (Signed)
Patient ID: Geoffrey Rangel, male   DOB: 11-16-1960, 58 y.o.   MRN: [id number]  Chief Complaint  Patient presents with  . Follow-up    [facility] 2week     HPI Geoffrey Rangel is a 58 y.o. male.     The patient is seen for evaluation of painful lower extremities and diminished pulses. Patient notes the pain is always associated with activity and is very consistent day today. Typically, the pain occurs at less than one block, progress is as activity continues to the point that the patient must stop walking. Resting including standing still for several minutes allowed resumption of the activity and the ability to walk a similar distance before stopping again. Uneven terrain and inclined shorten the distance. The pain has been progressive over the past several years. The patient states the inability to walk is now having a profound negative impact on quality of life and daily activities.  The patient denies rest pain or dangling of an extremity off the side of the bed during the night for relief. No open wounds or sores at this time. No prior interventions or surgeries.  No history of back problems or DJD of the lumbar sacral spine.   The patient denies changes in claudication symptoms or new rest pain symptoms.  No new ulcers or wounds of the foot.  The patient's blood pressure has been stable and relatively well controlled. The patient denies amaurosis fugax or recent TIA symptoms. There are no recent neurological changes noted. The patient denies history of DVT, PE or superficial thrombophlebitis. The patient denies recent episodes of angina or shortness of breath.    Past Medical History:  Diagnosis Date  . Arthritis   . Chronic kidney disease   . Dyspnea    occasional  . GERD (gastroesophageal reflux disease)   . Hyperlipidemia   . Hypertension   . Renal disorder     Past Surgical History:  Procedure Laterality Date  . AV FISTULA PLACEMENT Right 09/13/2018   Procedure:  ARTERIOVENOUS (AV) FISTULA CREATION ( BRACHIAL CEPHALIC );  Surgeon: Algernon Huxley, MD;  Location: [facility];  Service: Vascular;  Laterality: Right;  . DIALYSIS/PERMA CATHETER INSERTION N/A 06/19/2018   Procedure: DIALYSIS/PERMA CATHETER INSERTION;  Surgeon: Algernon Huxley, MD;  Location: [facility];  Service: Cardiovascular;  Laterality: N/A;  . DIALYSIS/PERMA CATHETER INSERTION N/A 07/19/2018   Procedure: DIALYSIS/PERMA CATHETER INSERTION;  Surgeon: Katha Cabal, MD;  Location: [place] [facility];  Service: Cardiovascular;  Laterality: N/A;  . DIALYSIS/PERMA CATHETER INSERTION N/A 09/06/2018   Procedure: DIALYSIS/PERMA CATHETER INSERTION;  Surgeon: Katha Cabal, MD;  Location: [place] [facility];  Service: Cardiovascular;  Laterality: N/A;  . RENAL BIOPSY, PERCUTANEOUS  09/17/2016          No Known Allergies  Current Outpatient Medications  Medication Sig Dispense Refill  . albuterol (VENTOLIN HFA) 108 (90 Base) MCG/ACT inhaler Inhale 1-2 puffs into the lungs every 4 (four) hours as needed for shortness of breath. 1 Inhaler 1  . aspirin EC 81 MG tablet Take 81 mg by mouth daily.    Marland Kitchen atorvastatin (LIPITOR) 40 MG tablet Take 1 tablet (40 mg total) by mouth daily at 6 PM. 30 tablet 2  . ferric citrate (AURYXIA) 1 GM 210 MG(Fe) tablet Take 210 mg by mouth 3 (three) times daily with meals.    Marland Kitchen HYDROcodone-acetaminophen (NORCO) 5-325 MG tablet Take 1 tablet by mouth every 6 (six) hours as  needed for moderate pain. 30 tablet 0  . isosorbide mononitrate (IMDUR) 30 MG 24 hr tablet Take 1 tablet (30 mg total) by mouth daily. 30 tablet 2  . nitroGLYCERIN (NITROSTAT) 0.4 MG SL tablet Place 1 tablet (0.4 mg total) under the tongue every 5 (five) minutes as needed for chest pain. 25 tablet 3  . sodium bicarbonate 650 MG tablet Take 1 tablet (650 mg total) by mouth 2 (two) times daily. 60 tablet 2   No current facility-administered medications for this visit.         Physical  Exam BP (!) 162/72 (BP Location: Left Arm)   Pulse 78   Resp 16   Wt 190 lb (86.2 kg)   BMI 25.07 kg/m  Gen:  WD/WN, NAD Skin: incision C/D/I Pedal pulses not palpable    Assessment/Plan:  1. End stage renal disease ([place]) Continue dialysis without interuption  2. Atherosclerosis of native artery of both lower extremities with intermittent claudication (HCC) Recommend:  Patient should undergo arterial duplex of the lower extremity ASAP because there has been a significant deterioration in the patient's lower extremity symptoms.  The patient states they are having increased pain and a marked decrease in the distance that they can walk.  The risks and benefits as well as the alternatives were discussed in detail with the patient.  All questions were answered.  Patient agrees to proceed and understands this could be a prelude to angiography and intervention.  The patient will follow up with me in the office to review the studies.   - VAS [place] LOWER EXTREMITY ARTERIAL DUPLEX; Future - VAS [place] ABI WITH/WO TBI; Future  3. Mixed hyperlipidemia Continue statin as ordered and reviewed, no changes at this time   4. Essential hypertension Continue antihypertensive medications as already ordered, these medications have been reviewed and there are no changes at this time.       Hortencia Pilar 10/02/2018, 11:07 AM   This note was created with Dragon medical transcription system.  Any errors from dictation are unintentional.

## 2018-10-05 ENCOUNTER — Telehealth (INDEPENDENT_AMBULATORY_CARE_PROVIDER_SITE_OTHER): Payer: Self-pay | Admitting: Vascular Surgery

## 2018-10-05 NOTE — Telephone Encounter (Signed)
CALLED TO REQUEST PATIENT PLAN OF CARE. THE NOTE IS NOT COMPLETE AND HE DOES NOT HAVE AN [place] SCHEDULED WITH HIS NEXT APPT AND SHE WOULD LIKE TO KNOW WHY. SHE STATES THAT THEY NEED TO KNOW WHEN THEY WILL BE ABLE TO USE IS ACCESS SHE STATES CALLING HER CELL WILL BE FINE.

## 2018-10-05 NOTE — Telephone Encounter (Signed)
I spoke with [facility] and she wanted to know if the patient will have a ultrasound on 11/03/2018 and I inform her that I spoke with Dr Delana Meyer and he just wanted the patient to be schedule for a follow up and after that we can move forward on when the patient can use his access

## 2018-10-06 ENCOUNTER — Telehealth (INDEPENDENT_AMBULATORY_CARE_PROVIDER_SITE_OTHER): Payer: Self-pay | Admitting: Vascular Surgery

## 2018-10-15 DIAGNOSIS — I70219 Atherosclerosis of native arteries of extremities with intermittent claudication, unspecified extremity: Secondary | ICD-10-CM | POA: Insufficient documentation

## 2018-11-03 ENCOUNTER — Other Ambulatory Visit: Payer: Self-pay

## 2018-11-03 ENCOUNTER — Encounter (INDEPENDENT_AMBULATORY_CARE_PROVIDER_SITE_OTHER): Payer: Self-pay | Admitting: Vascular Surgery

## 2018-11-03 ENCOUNTER — Ambulatory Visit (INDEPENDENT_AMBULATORY_CARE_PROVIDER_SITE_OTHER): Payer: 59 | Admitting: Vascular Surgery

## 2018-11-03 VITALS — BP 144/72 | HR 69 | Resp 16 | Ht 72.0 in | Wt 191.8 lb

## 2018-11-03 DIAGNOSIS — N186 End stage renal disease: Secondary | ICD-10-CM

## 2018-11-03 DIAGNOSIS — I10 Essential (primary) hypertension: Secondary | ICD-10-CM

## 2018-11-03 DIAGNOSIS — E782 Mixed hyperlipidemia: Secondary | ICD-10-CM

## 2018-11-03 NOTE — Assessment & Plan Note (Signed)
Right brachiocephalic AV fistula is ready to use.  I am concerned he may have steal syndrome of the right hand.  I have recommended he continue using his exercise ball and trying to promote blood flow to the hand.  We will going to give him several weeks of using the fistula see if this makes the symptoms worse or better.  I will plan to see him back in a month or 2 and follow-up with a steal study of the right hand.  We can also get his PermCath out once he starts using his AV fistula.

## 2018-11-03 NOTE — Assessment & Plan Note (Signed)
lipid control important in reducing the progression of atherosclerotic disease. Continue statin therapy  

## 2018-11-03 NOTE — Assessment & Plan Note (Signed)
Likely an underlying cause of his ESRD and blood pressure control important in reducing the progression of atherosclerotic disease. On appropriate oral medications.  

## 2018-11-03 NOTE — Progress Notes (Signed)
Patient ID: Geoffrey Rangel, male   DOB: [birth year], 58 y.o.   MRN: [id number]  Chief Complaint  Patient presents with  . Follow-up    91month follow up    HPI Geoffrey Rangel is a 58 y.o. male.  Patient returns in follow-up about 6 or 7 weeks status post right brachiocephalic AV fistula creation.  This is a large AV fistula with an excellent thrill and bruit.  He has not yet used it for dialysis.  He has had some coolness of his fingers as well as numbness of the fingertips and some pain in the right hand with dialysis treatments through his catheter.  He has been using the exercise ball to try to help this but it has not helped much.  No open wounds or infection.   Past Medical History:  Diagnosis Date  . Arthritis   . Chronic kidney disease   . Dyspnea    occasional  . GERD (gastroesophageal reflux disease)   . Hyperlipidemia   . Hypertension   . Renal disorder     Past Surgical History:  Procedure Laterality Date  . AV FISTULA PLACEMENT Right 09/13/2018   Procedure: ARTERIOVENOUS (AV) FISTULA CREATION ( BRACHIAL CEPHALIC );  Surgeon: Algernon Huxley, MD;  Location: [facility];  Service: Vascular;  Laterality: Right;  . DIALYSIS/PERMA CATHETER INSERTION N/A 06/19/2018   Procedure: DIALYSIS/PERMA CATHETER INSERTION;  Surgeon: Algernon Huxley, MD;  Location: [place] [facility];  Service: Cardiovascular;  Laterality: N/A;  . DIALYSIS/PERMA CATHETER INSERTION N/A 07/19/2018   Procedure: DIALYSIS/PERMA CATHETER INSERTION;  Surgeon: Katha Cabal, MD;  Location: [place] [facility];  Service: Cardiovascular;  Laterality: N/A;  . DIALYSIS/PERMA CATHETER INSERTION N/A 09/06/2018   Procedure: DIALYSIS/PERMA CATHETER INSERTION;  Surgeon: Katha Cabal, MD;  Location: [place] [facility];  Service: Cardiovascular;  Laterality: N/A;  . RENAL BIOPSY, PERCUTANEOUS  09/17/2016          No Known Allergies  Current Outpatient Medications  Medication Sig Dispense Refill  . albuterol  (VENTOLIN HFA) 108 (90 Base) MCG/ACT inhaler Inhale 1-2 puffs into the lungs every 4 (four) hours as needed for shortness of breath. 1 Inhaler 1  . aspirin EC 81 MG tablet Take 81 mg by mouth daily.    Marland Kitchen atorvastatin (LIPITOR) 40 MG tablet Take 1 tablet (40 mg total) by mouth daily at 6 PM. 30 tablet 2  . ferric citrate (AURYXIA) 1 GM 210 MG(Fe) tablet Take 210 mg by mouth 3 (three) times daily with meals.    Marland Kitchen HYDROcodone-acetaminophen (NORCO) 5-325 MG tablet Take 1 tablet by mouth every 6 (six) hours as needed for moderate pain. 30 tablet 0  . isosorbide mononitrate (IMDUR) 30 MG 24 hr tablet Take 1 tablet (30 mg total) by mouth daily. 30 tablet 2  . nitroGLYCERIN (NITROSTAT) 0.4 MG SL tablet Place 1 tablet (0.4 mg total) under the tongue every 5 (five) minutes as needed for chest pain. 25 tablet 3  . sodium bicarbonate 650 MG tablet Take 1 tablet (650 mg total) by mouth 2 (two) times daily. 60 tablet 2   No current facility-administered medications for this visit.         Physical Exam BP (!) 144/72 (BP Location: Left Arm)   Pulse 69   Resp 16   Ht 6' (1.829 m)   Wt 191 lb 12.8 oz (87 kg)   BMI 26.01 kg/m  Gen:  WD/WN, NAD Skin: incision C/D/I Ext: Right  brachiocephalic AV fistula with excellent thrill and bruit.  Right radial pulse 1+.     Assessment/Plan:  Essential hypertension Likely an underlying cause of his ESRD and blood pressure control important in reducing the progression of atherosclerotic disease. On appropriate oral medications.   Hyperlipidemia lipid control important in reducing the progression of atherosclerotic disease. Continue statin therapy   End stage renal disease ([place]) Right brachiocephalic AV fistula is ready to use.  I am concerned he may have steal syndrome of the right hand.  I have recommended he continue using his exercise ball and trying to promote blood flow to the hand.  We will going to give him several weeks of using the fistula see if  this makes the symptoms worse or better.  I will plan to see him back in a month or 2 and follow-up with a steal study of the right hand.  We can also get his PermCath out once he starts using his AV fistula.      Leotis Pain 11/03/2018, 12:28 PM   This note was created with Dragon medical transcription system.  Any errors from dictation are unintentional.

## 2018-11-27 ENCOUNTER — Telehealth (INDEPENDENT_AMBULATORY_CARE_PROVIDER_SITE_OTHER): Payer: Self-pay

## 2018-11-27 NOTE — Telephone Encounter (Signed)
A fax was received from [facility]on Payor that the patient needs to have a permcath removal. Patient has been scheduled for 12/06/2018 with a 11:00 am arrival time to the [facility] and will do his Covid testing on 12/01/2018 between 12:30-2:30 pm at the [facility]. This pre-procedure information will be faxed back to [employer] attention [place]. The information will also be mailed to the patients home.

## 2018-12-01 ENCOUNTER — Other Ambulatory Visit
Admission: RE | Admit: 2018-12-01 | Discharge: 2018-12-01 | Disposition: A | Discharge status: Home / Self Care | Payer: 59 | Source: Ambulatory Visit | Attending: Vascular Surgery | Admitting: Vascular Surgery

## 2018-12-01 ENCOUNTER — Other Ambulatory Visit (INDEPENDENT_AMBULATORY_CARE_PROVIDER_SITE_OTHER): Payer: Self-pay | Admitting: Nurse Practitioner

## 2018-12-01 ENCOUNTER — Other Ambulatory Visit: Payer: Self-pay

## 2018-12-01 DIAGNOSIS — Z01812 Encounter for preprocedural laboratory examination: Secondary | ICD-10-CM | POA: Insufficient documentation

## 2018-12-01 DIAGNOSIS — Z20828 Contact with and (suspected) exposure to other viral communicable diseases: Secondary | ICD-10-CM | POA: Diagnosis not present

## 2018-12-01 DIAGNOSIS — N186 End stage renal disease: Secondary | ICD-10-CM | POA: Diagnosis not present

## 2018-12-02 LAB — SARS CORONAVIRUS 2 (TAT 6-24 HRS): SARS Coronavirus 2: NEGATIVE

## 2018-12-05 MED ORDER — CEFAZOLIN SODIUM-DEXTROSE 1-4 GM/50ML-% IV SOLN
1.0000 g | Freq: Once | INTRAVENOUS | Status: DC
  Filled 2018-12-05: qty 50

## 2018-12-06 ENCOUNTER — Ambulatory Visit
Admission: RE | Admit: 2018-12-06 | Discharge: 2018-12-06 | Disposition: A | Discharge status: Home / Self Care | Payer: 59 | Attending: Vascular Surgery | Admitting: Vascular Surgery

## 2018-12-06 ENCOUNTER — Encounter
Admission: RE | Disposition: A | Discharge status: Home / Self Care | Payer: Self-pay | Source: Home / Self Care | Attending: Vascular Surgery

## 2018-12-06 DIAGNOSIS — I120 Hypertensive chronic kidney disease with stage 5 chronic kidney disease or end stage renal disease: Secondary | ICD-10-CM | POA: Insufficient documentation

## 2018-12-06 DIAGNOSIS — T82898A Other specified complication of vascular prosthetic devices, implants and grafts, initial encounter: Secondary | ICD-10-CM | POA: Diagnosis not present

## 2018-12-06 DIAGNOSIS — R0600 Dyspnea, unspecified: Secondary | ICD-10-CM | POA: Diagnosis not present

## 2018-12-06 DIAGNOSIS — E785 Hyperlipidemia, unspecified: Secondary | ICD-10-CM | POA: Diagnosis not present

## 2018-12-06 DIAGNOSIS — X58XXXA Exposure to other specified factors, initial encounter: Secondary | ICD-10-CM | POA: Insufficient documentation

## 2018-12-06 DIAGNOSIS — Z992 Dependence on renal dialysis: Secondary | ICD-10-CM | POA: Insufficient documentation

## 2018-12-06 DIAGNOSIS — K219 Gastro-esophageal reflux disease without esophagitis: Secondary | ICD-10-CM | POA: Diagnosis not present

## 2018-12-06 DIAGNOSIS — Z7951 Long term (current) use of inhaled steroids: Secondary | ICD-10-CM | POA: Insufficient documentation

## 2018-12-06 DIAGNOSIS — N186 End stage renal disease: Secondary | ICD-10-CM | POA: Insufficient documentation

## 2018-12-06 DIAGNOSIS — Z79899 Other long term (current) drug therapy: Secondary | ICD-10-CM | POA: Diagnosis not present

## 2018-12-06 DIAGNOSIS — Z7982 Long term (current) use of aspirin: Secondary | ICD-10-CM | POA: Insufficient documentation

## 2018-12-06 HISTORY — PX: DIALYSIS/PERMA CATHETER REMOVAL: CATH118289

## 2018-12-06 SURGERY — DIALYSIS/PERMA CATHETER REMOVAL
Anesthesia: LOCAL

## 2018-12-06 MED ORDER — BACITRACIN-NEOMYCIN-POLYMYXIN 400-5-5000 EX OINT
TOPICAL_OINTMENT | CUTANEOUS | Status: AC
  Filled 2018-12-06: qty 1

## 2018-12-06 SURGICAL SUPPLY — 3 items
FORCEPS HALSTEAD CVD 5IN STRL (INSTRUMENTS) ×2 IMPLANT
SUT MNCRL AB 4-0 PS2 18 (SUTURE) ×2 IMPLANT
TRAY LACERAT/PLASTIC (MISCELLANEOUS) ×2 IMPLANT

## 2018-12-06 NOTE — Op Note (Signed)
  OPERATIVE NOTE   PROCEDURE: 1. Removal of a right IJ tunneled dialysis catheter  PRE-OPERATIVE DIAGNOSIS: Complication of dialysis catheter, End stage renal disease  POST-OPERATIVE DIAGNOSIS: Same  SURGEON: Hortencia Pilar, M.D.  ANESTHESIA: Local anesthetic with 1% lidocaine with epinephrine   ESTIMATED BLOOD LOSS: Minimal   FINDING(S): 1. Catheter intact   SPECIMEN(S):  Catheter  INDICATIONS:   Geoffrey Rangel is a 58 y.o. male who presents with functioning PD catheter and a clotted right IJ tunnel catheter.  The patient has undergone placement of an peritoneal access which is working and this has been successfully used without difficulty.  therefore is undergoing removal of his tunneled catheter which is no longer needed to avoid septic complications.   DESCRIPTION: After obtaining full informed written consent, the patient was positioned supine. The right IJ catheter and surrounding area is prepped and draped in a sterile fashion. The cuff was localized by palpation and noted to be less than 3 cm from the exit site. After appropriate timeout is called, 1% lidocaine with epinephrine is infiltrated into the surrounding tissues around the cuff. Small transverse incision is created at the exit site with an 11 blade scalpel and the dissection was carried up along the catheter to expose the cuff of the tunneled catheter.  The catheter cuff is then freed from the surrounding attachments and adhesions. Once the catheter has been freed circumferentially it is removed in 1 piece. Light pressure was held at the base of the neck.   Antibiotic ointment and a sterile dressing is applied to the exit site. Patient tolerated procedure well and there were no complications.  COMPLICATIONS: None  CONDITION: Unchanged  Hortencia Pilar, M.D. [facility] Office: [phone]  12/06/2018,2:56 PM

## 2018-12-06 NOTE — Discharge Instructions (Signed)
Tunneled Catheter Removal, Care After °Refer to this sheet in the next few weeks. These instructions provide you with information about caring for yourself after your procedure. Your health care provider may also give you more specific instructions. Your treatment has been planned according to current medical practices, but problems sometimes occur. Call your health care provider if you have any problems or questions after your procedure. °What can I expect after the procedure? °After the procedure, it is common to have: °· Some mild redness, swelling, and pain around your catheter site. ° ° °Follow these instructions at home: °Incision care  °· Check your removal site  every day for signs of infection. Check for: °¨ More redness, swelling, or pain. °¨ More fluid or blood. °¨ Warmth. °¨ Pus or a bad smell. °· Follow instructions from your health care provider about how to take care of your removal site. Make sure you: °¨ Wash your hands with soap and water before you change your bandages (dressings). If soap and water are not available, use hand sanitizer. °Activity  °· Return to your normal activities as told by your health care provider. Ask your health care provider what activities are safe for you. °· Do not lift anything that is heavier than 10 lb (4.5 kg) for 3 weeks or as long as told by your health care provider. ° °Contact a health care provider if: °· You have more fluid or blood coming from your removal site °· You have more redness, swelling, or pain at your incisions or around the area where your catheter was removed °· Your removal site feel warm to the touch. °· You feel unusually weak. °· You feel nauseous.. °· Get help right away if °· You have swelling in your arm, shoulder, neck, or face. °· You develop chest pain. °· You have difficulty breathing. °· You feel dizzy or light-headed. °· You have pus or a bad smell coming from your removal site °· You have a fever. °· You develop bleeding from your  removal site, and your bleeding does not stop. °This information is not intended to replace advice given to you by your health care provider. Make sure you discuss any questions you have with your health care provider. °Document Released: 03/01/2012 Document Revised: 11/16/2015 Document Reviewed: 12/09/2014 °Elsevier Interactive Patient Education © 2017 [employer] ° °

## 2018-12-07 ENCOUNTER — Encounter: Payer: Self-pay | Admitting: Vascular Surgery

## 2019-01-05 ENCOUNTER — Ambulatory Visit (INDEPENDENT_AMBULATORY_CARE_PROVIDER_SITE_OTHER): Payer: 59 | Admitting: Vascular Surgery

## 2019-01-05 ENCOUNTER — Encounter (INDEPENDENT_AMBULATORY_CARE_PROVIDER_SITE_OTHER): Payer: Self-pay | Admitting: Vascular Surgery

## 2019-01-05 ENCOUNTER — Other Ambulatory Visit: Payer: Self-pay

## 2019-01-05 ENCOUNTER — Ambulatory Visit (INDEPENDENT_AMBULATORY_CARE_PROVIDER_SITE_OTHER): Payer: 59

## 2019-01-05 VITALS — BP 132/71 | HR 73 | Resp 16 | Wt 191.8 lb

## 2019-01-05 DIAGNOSIS — N186 End stage renal disease: Secondary | ICD-10-CM

## 2019-01-05 DIAGNOSIS — E782 Mixed hyperlipidemia: Secondary | ICD-10-CM

## 2019-01-05 DIAGNOSIS — I10 Essential (primary) hypertension: Secondary | ICD-10-CM | POA: Diagnosis not present

## 2019-01-05 NOTE — Progress Notes (Signed)
MRN : [id number]  Geoffrey Rangel is a 58 y.o. ([birth year]) male who presents with chief complaint of  Chief Complaint  Patient presents with  . Follow-up    19month ultrasound   .  History of Present Illness: Patient returns today in follow up of his dialysis access and right arm and hand pain.  He does still have some numbness in his forearm and hand as well as coolness of the hand.  This does sometimes get worse with dialysis but it can happen without dialysis.  His fistula is working very well for dialysis and his catheter was removed about a month ago.  Duplex today shows only mild narrowing near the brachiocephalic anastomosis and at the cephalic vein subclavian vein confluence it does not appear significant.  Compression of the fistula does not result in increased blood flow to the hand so this is not a clear case of steal syndrome.  Current Outpatient Medications  Medication Sig Dispense Refill  . amLODipine (NORVASC) 2.5 MG tablet Take 2.5 mg by mouth daily.    Marland Kitchen aspirin EC 81 MG tablet Take 81 mg by mouth daily.    Marland Kitchen atorvastatin (LIPITOR) 40 MG tablet Take 1 tablet (40 mg total) by mouth daily at 6 PM. 30 tablet 2  . ferric citrate (AURYXIA) 1 GM 210 MG(Fe) tablet Take 210 mg by mouth 3 (three) times daily with meals.    . isosorbide mononitrate (IMDUR) 30 MG 24 hr tablet Take 1 tablet (30 mg total) by mouth daily. 30 tablet 2  . lidocaine-prilocaine (EMLA) cream Apply 1 application topically as needed (port access).    . sodium bicarbonate 650 MG tablet Take 1 tablet (650 mg total) by mouth 2 (two) times daily. 60 tablet 2  . albuterol (VENTOLIN HFA) 108 (90 Base) MCG/ACT inhaler Inhale 1-2 puffs into the lungs every 4 (four) hours as needed for shortness of breath. (Patient not taking: Reported on 11/30/2018) 1 Inhaler 1  . HYDROcodone-acetaminophen (NORCO) 5-325 MG tablet Take 1 tablet by mouth every 6 (six) hours as needed for moderate pain. (Patient not taking: Reported on  11/30/2018) 30 tablet 0  . nitroGLYCERIN (NITROSTAT) 0.4 MG SL tablet Place 1 tablet (0.4 mg total) under the tongue every 5 (five) minutes as needed for chest pain. (Patient not taking: Reported on 11/30/2018) 25 tablet 3   No current facility-administered medications for this visit.     Past Medical History:  Diagnosis Date  . Arthritis   . Chronic kidney disease   . Dyspnea    occasional  . GERD (gastroesophageal reflux disease)   . Hyperlipidemia   . Hypertension   . Renal disorder     Past Surgical History:  Procedure Laterality Date  . AV FISTULA PLACEMENT Right 09/13/2018   Procedure: ARTERIOVENOUS (AV) FISTULA CREATION ( BRACHIAL CEPHALIC );  Surgeon: Algernon Huxley, MD;  Location: [facility];  Service: Vascular;  Laterality: Right;  . DIALYSIS/PERMA CATHETER INSERTION N/A 06/19/2018   Procedure: DIALYSIS/PERMA CATHETER INSERTION;  Surgeon: Algernon Huxley, MD;  Location: [place] [facility];  Service: Cardiovascular;  Laterality: N/A;  . DIALYSIS/PERMA CATHETER INSERTION N/A 07/19/2018   Procedure: DIALYSIS/PERMA CATHETER INSERTION;  Surgeon: Katha Cabal, MD;  Location: [place] [facility];  Service: Cardiovascular;  Laterality: N/A;  . DIALYSIS/PERMA CATHETER INSERTION N/A 09/06/2018   Procedure: DIALYSIS/PERMA CATHETER INSERTION;  Surgeon: Katha Cabal, MD;  Location: [place] [facility];  Service: Cardiovascular;  Laterality: N/A;  . DIALYSIS/PERMA  CATHETER REMOVAL N/A 12/06/2018   Procedure: DIALYSIS/PERMA CATHETER REMOVAL;  Surgeon: Katha Cabal, MD;  Location: [place] [facility];  Service: Cardiovascular;  Laterality: N/A;  . RENAL BIOPSY, PERCUTANEOUS  09/17/2016        Social History Social History   Tobacco Use  . Smoking status: Current Every Day Smoker    Packs/day: 1.00    Years: 34.00    Pack years: 34.00  . Smokeless tobacco: Former Systems developer    Types: Snuff    Quit date: 09/12/2015  Substance Use Topics  . Alcohol use: Never    Frequency: Never   . Drug use: Never    Family History Family History  Problem Relation Age of Onset  . CAD Father 18  . Hypertension Father   . Chronic Renal Failure Father   . Congestive Heart Failure Sister   . Kidney disease Sister   . Cirrhosis Maternal Grandfather   . Alcohol abuse Maternal Grandfather   . Colon cancer Paternal Grandfather   . Congestive Heart Failure Paternal Grandmother     No Known Allergies   REVIEW OF SYSTEMS (Negative unless checked)  Constitutional: [] Weight loss  [] Fever  [] Chills Cardiac: [] Chest pain   [] Chest pressure   [] Palpitations   [] Shortness of breath when laying flat   [] Shortness of breath at rest   [] Shortness of breath with exertion. Vascular:  [] Pain in legs with walking   [] Pain in legs at rest   [] Pain in legs when laying flat   [] Claudication   [] Pain in feet when walking  [] Pain in feet at rest  [] Pain in feet when laying flat   [] History of DVT   [] Phlebitis   [] Swelling in legs   [] Varicose veins   [] Non-healing ulcers Pulmonary:   [] Uses home oxygen   [] Productive cough   [] Hemoptysis   [] Wheeze  [] COPD   [] Asthma Neurologic:  [] Dizziness  [] Blackouts   [] Seizures   [] History of stroke   [] History of TIA  [] Aphasia   [] Temporary blindness   [] Dysphagia   [] Weakness or numbness in arms   [] Weakness or numbness in legs Musculoskeletal:  [x] Arthritis   [] Joint swelling   [] Joint pain   [] Low back pain Hematologic:  [] Easy bruising  [] Easy bleeding   [] Hypercoagulable state   [x] Anemic   Gastrointestinal:  [] Blood in stool   [] Vomiting blood  [] Gastroesophageal reflux/heartburn   [] Abdominal pain Genitourinary:  [x] Chronic kidney disease   [] Difficult urination  [] Frequent urination  [] Burning with urination   [] Hematuria Skin:  [] Rashes   [] Ulcers   [] Wounds Psychological:  [] History of anxiety   []  History of major depression.  Physical Examination  BP 132/71 (BP Location: Left Arm)   Pulse 73   Resp 16   Wt 191 lb 12.8 oz (87 kg)   BMI 26.01  kg/m  Gen:  WD/WN, NAD Head: [place]/AT, No temporalis wasting. Ear/Nose/Throat: Hearing grossly intact, nares w/o erythema or drainage Eyes: Conjunctiva clear. Sclera non-icteric Neck: Supple.  Trachea midline Pulmonary:  Good air movement, no use of accessory muscles.  Cardiac: RRR, no JVD Vascular: Strong thrill right brachiocephalic AVF Vessel Right Left  Radial 1+ Palpable Palpable                       Musculoskeletal: M/S 5/5 throughout.  No deformity or atrophy. No edema. Neurologic: Sensation grossly intact in extremities.  Symmetrical.  Speech is fluent.  Psychiatric: Judgment intact, Mood & affect appropriate for pt's clinical situation.  Dermatologic: No rashes or ulcers noted.  No cellulitis or open wounds.       Labs Recent Results (from the past 2160 hour(s))  SARS CORONAVIRUS 2 (TAT 6-24 HRS) Nasopharyngeal Nasopharyngeal Swab     Status: None   Collection Time: 12/01/18  1:04 PM   Specimen: Nasopharyngeal Swab  Result Value Ref Range   SARS Coronavirus 2 NEGATIVE NEGATIVE    Comment: (NOTE) SARS-CoV-2 target nucleic acids are NOT DETECTED. The SARS-CoV-2 RNA is generally detectable in upper and lower respiratory specimens during the acute phase of infection. Negative results do not preclude SARS-CoV-2 infection, do not rule out co-infections with other pathogens, and should not be used as the sole basis for treatment or other patient management decisions. Negative results must be combined with clinical observations, patient history, and epidemiological information. The expected result is Negative. Fact Sheet for Patients: SugarRoll.be Fact Sheet for Healthcare Providers: https://www.woods-mathews.com/ This test is not yet approved or cleared by the [place] FDA and  has been authorized for detection and/or diagnosis of SARS-CoV-2 by FDA under an Emergency Use Authorization (EUA). This EUA will remain  in  effect (meaning this test can be used) for the duration of the COVID-19 declaration under Section 56 4(b)(1) of the Act, 21 U.S.C. section 360bbb-3(b)(1), unless the authorization is terminated or revoked sooner. Performed at [facility], [place] [address], [place], [place] [address]     Radiology No results found.  Assessment/Plan Essential hypertension Likely an underlying cause of his ESRD and blood pressure control important in reducing the progression of atherosclerotic disease. On appropriate oral medications.   Hyperlipidemia lipid control important in reducing the progression of atherosclerotic disease. Continue statin therapy  End stage renal disease ([facility]) Duplex today shows only mild narrowing near the brachiocephalic anastomosis and at the cephalic vein subclavian vein confluence it does not appear significant.  Compression of the fistula does not result in increased blood flow to the hand so this is not a clear case of steal syndrome. At this point, we are going to try to increase the exercise of the right hand to promote blood flow and continue to use the fistula for dialysis.  If his symptoms worsen, I would consider right upper extremity angiogram.  If his symptoms improve, continued conservative therapy will be planned.  I will plan a follow-up in 3 to 4 months to see how he is doing.    Leotis Pain, MD  01/05/2019 9:58 AM    This note was created with Dragon medical transcription system.  Any errors from dictation are purely unintentional

## 2019-01-05 NOTE — Assessment & Plan Note (Signed)
Duplex today shows only mild narrowing near the brachiocephalic anastomosis and at the cephalic vein subclavian vein confluence it does not appear significant.  Compression of the fistula does not result in increased blood flow to the hand so this is not a clear case of steal syndrome. At this point, we are going to try to increase the exercise of the right hand to promote blood flow and continue to use the fistula for dialysis.  If his symptoms worsen, I would consider right upper extremity angiogram.  If his symptoms improve, continued conservative therapy will be planned.  I will plan a follow-up in 3 to 4 months to see how he is doing.

## 2019-04-20 ENCOUNTER — Ambulatory Visit (INDEPENDENT_AMBULATORY_CARE_PROVIDER_SITE_OTHER): Payer: 59 | Admitting: Vascular Surgery

## 2019-04-28 ENCOUNTER — Other Ambulatory Visit
Admission: RE | Admit: 2019-04-28 | Discharge: 2019-04-28 | Disposition: A | Discharge status: Home / Self Care | Payer: 59 | Source: Ambulatory Visit | Attending: Nephrology | Admitting: Nephrology

## 2019-04-28 DIAGNOSIS — N186 End stage renal disease: Secondary | ICD-10-CM | POA: Insufficient documentation

## 2019-04-28 LAB — POTASSIUM: Potassium: 5 mmol/L (ref 3.5–5.1)

## 2019-05-04 ENCOUNTER — Encounter (INDEPENDENT_AMBULATORY_CARE_PROVIDER_SITE_OTHER): Payer: Self-pay | Admitting: Vascular Surgery

## 2019-05-04 ENCOUNTER — Ambulatory Visit (INDEPENDENT_AMBULATORY_CARE_PROVIDER_SITE_OTHER): Payer: 59 | Admitting: Vascular Surgery

## 2019-05-04 ENCOUNTER — Other Ambulatory Visit: Payer: Self-pay

## 2019-05-04 ENCOUNTER — Encounter (INDEPENDENT_AMBULATORY_CARE_PROVIDER_SITE_OTHER): Payer: Self-pay

## 2019-05-04 VITALS — BP 153/71 | HR 71 | Resp 17 | Ht 72.0 in | Wt 177.0 lb

## 2019-05-04 DIAGNOSIS — N186 End stage renal disease: Secondary | ICD-10-CM | POA: Diagnosis not present

## 2019-05-04 DIAGNOSIS — E782 Mixed hyperlipidemia: Secondary | ICD-10-CM

## 2019-05-04 DIAGNOSIS — I10 Essential (primary) hypertension: Secondary | ICD-10-CM

## 2019-05-04 NOTE — Assessment & Plan Note (Signed)
His access is currently working well.  No major issues.  Has required intervention in the past and at this point I would plan a 11-month follow-up with noninvasive studies.

## 2019-05-04 NOTE — Progress Notes (Signed)
MRN : [id number]  Geoffrey Rangel is a 59 y.o. (1960-11-19) male who presents with chief complaint of  Chief Complaint  Patient presents with  . Follow-up    2-3 month f/u  .  History of Present Illness: Patient returns today in follow up of his dialysis access.  He still has some numbness in his forearm but his fistula is working really well.  No major issues with access, poor flow, or prolonged bleeding.  No worsening of hand pain and a previous study that did not show steal syndrome.  Current Outpatient Medications  Medication Sig Dispense Refill  . amLODipine (NORVASC) 2.5 MG tablet Take 2.5 mg by mouth daily.    Marland Kitchen aspirin EC 81 MG tablet Take 81 mg by mouth daily.    Marland Kitchen atorvastatin (LIPITOR) 40 MG tablet Take 1 tablet (40 mg total) by mouth daily at 6 PM. 30 tablet 2  . ferric citrate (AURYXIA) 1 GM 210 MG(Fe) tablet Take 210 mg by mouth 3 (three) times daily with meals.    Marland Kitchen albuterol (VENTOLIN HFA) 108 (90 Base) MCG/ACT inhaler Inhale 1-2 puffs into the lungs every 4 (four) hours as needed for shortness of breath. (Patient not taking: Reported on 11/30/2018) 1 Inhaler 1  . cinacalcet (SENSIPAR) 30 MG tablet Take 30 mg by mouth daily.    Marland Kitchen HYDROcodone-acetaminophen (NORCO) 5-325 MG tablet Take 1 tablet by mouth every 6 (six) hours as needed for moderate pain. (Patient not taking: Reported on 11/30/2018) 30 tablet 0  . isosorbide mononitrate (IMDUR) 30 MG 24 hr tablet Take 1 tablet (30 mg total) by mouth daily. (Patient not taking: Reported on 05/04/2019) 30 tablet 2  . lidocaine-prilocaine (EMLA) cream Apply 1 application topically as needed (port access).    . nitroGLYCERIN (NITROSTAT) 0.4 MG SL tablet Place 1 tablet (0.4 mg total) under the tongue every 5 (five) minutes as needed for chest pain. (Patient not taking: Reported on 11/30/2018) 25 tablet 3  . sodium bicarbonate 650 MG tablet Take 1 tablet (650 mg total) by mouth 2 (two) times daily. (Patient not taking: Reported on 05/04/2019) 60  tablet 2   No current facility-administered medications for this visit.    Past Medical History:  Diagnosis Date  . Arthritis   . Chronic kidney disease   . Dyspnea    occasional  . GERD (gastroesophageal reflux disease)   . Hyperlipidemia   . Hypertension   . Renal disorder     Past Surgical History:  Procedure Laterality Date  . AV FISTULA PLACEMENT Right 09/13/2018   Procedure: ARTERIOVENOUS (AV) FISTULA CREATION ( BRACHIAL CEPHALIC );  Surgeon: Algernon Huxley, MD;  Location: [facility];  Service: Vascular;  Laterality: Right;  . DIALYSIS/PERMA CATHETER INSERTION N/A 06/19/2018   Procedure: DIALYSIS/PERMA CATHETER INSERTION;  Surgeon: Algernon Huxley, MD;  Location: [place] [facility];  Service: Cardiovascular;  Laterality: N/A;  . DIALYSIS/PERMA CATHETER INSERTION N/A 07/19/2018   Procedure: DIALYSIS/PERMA CATHETER INSERTION;  Surgeon: Katha Cabal, MD;  Location: [place] [facility];  Service: Cardiovascular;  Laterality: N/A;  . DIALYSIS/PERMA CATHETER INSERTION N/A 09/06/2018   Procedure: DIALYSIS/PERMA CATHETER INSERTION;  Surgeon: Katha Cabal, MD;  Location: [place] [facility];  Service: Cardiovascular;  Laterality: N/A;  . DIALYSIS/PERMA CATHETER REMOVAL N/A 12/06/2018   Procedure: DIALYSIS/PERMA CATHETER REMOVAL;  Surgeon: Katha Cabal, MD;  Location: [place] [facility];  Service: Cardiovascular;  Laterality: N/A;  . RENAL BIOPSY, PERCUTANEOUS  09/17/2016  Social History   Tobacco Use  . Smoking status: Current Every Day Smoker    Packs/day: 1.00    Years: 34.00    Pack years: 34.00  . Smokeless tobacco: Former Systems developer    Types: Snuff    Quit date: 09/12/2015  Substance Use Topics  . Alcohol use: Never  . Drug use: Never    Family History  Problem Relation Age of Onset  . CAD Father 40  . Hypertension Father   . Chronic Renal Failure Father   . Congestive Heart Failure Sister   . Kidney disease Sister   . Cirrhosis Maternal Grandfather    . Alcohol abuse Maternal Grandfather   . Colon cancer Paternal Grandfather   . Congestive Heart Failure Paternal Grandmother     No Known Allergies   REVIEW OF SYSTEMS (Negative unless checked)  Constitutional: [] ?Weight loss  [] ?Fever  [] ?Chills Cardiac: [] ?Chest pain   [] ?Chest pressure   [] ?Palpitations   [] ?Shortness of breath when laying flat   [] ?Shortness of breath at rest   [] ?Shortness of breath with exertion. Vascular:  [] ?Pain in legs with walking   [] ?Pain in legs at rest   [] ?Pain in legs when laying flat   [] ?Claudication   [] ?Pain in feet when walking  [] ?Pain in feet at rest  [] ?Pain in feet when laying flat   [] ?History of DVT   [] ?Phlebitis   [] ?Swelling in legs   [] ?Varicose veins   [] ?Non-healing ulcers Pulmonary:   [] ?Uses home oxygen   [] ?Productive cough   [] ?Hemoptysis   [] ?Wheeze  [] ?COPD   [] ?Asthma Neurologic:  [] ?Dizziness  [] ?Blackouts   [] ?Seizures   [] ?History of stroke   [] ?History of TIA  [] ?Aphasia   [] ?Temporary blindness   [] ?Dysphagia   [] ?Weakness or numbness in arms   [] ?Weakness or numbness in legs Musculoskeletal:  [x] ?Arthritis   [] ?Joint swelling   [] ?Joint pain   [] ?Low back pain Hematologic:  [] ?Easy bruising  [] ?Easy bleeding   [] ?Hypercoagulable state   [x] ?Anemic   Gastrointestinal:  [] ?Blood in stool   [] ?Vomiting blood  [] ?Gastroesophageal reflux/heartburn   [] ?Abdominal pain Genitourinary:  [x] ?Chronic kidney disease   [] ?Difficult urination  [] ?Frequent urination  [] ?Burning with urination   [] ?Hematuria Skin:  [] ?Rashes   [] ?Ulcers   [] ?Wounds Psychological:  [] ?History of anxiety   [] ? History of major depression.  Physical Examination  BP (!) 153/71 (BP Location: Right Arm)   Pulse 71   Resp 17   Ht 6' (1.829 m)   Wt 177 lb (80.3 kg)   BMI 24.01 kg/m  Gen:  WD/WN, NAD Head: [place]/AT, No temporalis wasting. Ear/Nose/Throat: Hearing grossly intact, nares w/o erythema or drainage Eyes: Conjunctiva clear. Sclera non-icteric Neck:  Supple.  Trachea midline Pulmonary:  Good air movement, no use of accessory muscles.  Cardiac: RRR, no JVD Vascular: Right brachiocephalic AV fistula with excellent thrill Vessel Right Left  Radial Palpable Palpable                   Musculoskeletal: M/S 5/5 throughout.  No deformity or atrophy.  Trace lower extremity edema. Neurologic: Sensation grossly intact in extremities.  Symmetrical.  Speech is fluent.  Psychiatric: Judgment intact, Mood & affect appropriate for pt's clinical situation. Dermatologic: No rashes or ulcers noted.  No cellulitis or open wounds.       Labs Recent Results (from the past 2160 hour(s))  Potassium     Status: None   Collection Time: 04/28/19  6:00 AM  Result Value Ref  Range   Potassium 5.0 3.5 - 5.1 mmol/L    Comment: Performed at [facility], [address] [place], [place] [address]    Radiology No results found.  Assessment/Plan Essential hypertension Likely an underlying cause of his ESRD andblood pressure control important in reducing the progression of atherosclerotic disease. On appropriate oral medications.   Hyperlipidemia lipid control important in reducing the progression of atherosclerotic disease. Continue statin therapy  End stage renal disease ([place]) His access is currently working well.  No major issues.  Has required intervention in the past and at this point I would plan a 30-month follow-up with noninvasive studies.    Leotis Pain, MD  05/04/2019 9:46 AM    This note was created with Dragon medical transcription system.  Any errors from dictation are purely unintentional

## 2019-11-02 ENCOUNTER — Encounter (INDEPENDENT_AMBULATORY_CARE_PROVIDER_SITE_OTHER): Payer: 59

## 2019-11-02 ENCOUNTER — Ambulatory Visit (INDEPENDENT_AMBULATORY_CARE_PROVIDER_SITE_OTHER): Payer: 59 | Admitting: Nurse Practitioner

## 2019-11-13 ENCOUNTER — Encounter (INDEPENDENT_AMBULATORY_CARE_PROVIDER_SITE_OTHER): Payer: 59

## 2019-11-13 ENCOUNTER — Ambulatory Visit (INDEPENDENT_AMBULATORY_CARE_PROVIDER_SITE_OTHER): Payer: 59 | Admitting: Nurse Practitioner

## 2019-12-25 ENCOUNTER — Other Ambulatory Visit: Payer: Self-pay

## 2019-12-25 ENCOUNTER — Ambulatory Visit (INDEPENDENT_AMBULATORY_CARE_PROVIDER_SITE_OTHER): Payer: Medicare HMO | Admitting: Cardiovascular Disease

## 2019-12-25 ENCOUNTER — Encounter: Payer: Self-pay | Admitting: Cardiovascular Disease

## 2019-12-25 VITALS — BP 146/76 | HR 71 | Ht 72.0 in | Wt 180.4 lb

## 2019-12-25 DIAGNOSIS — R0602 Shortness of breath: Secondary | ICD-10-CM | POA: Diagnosis not present

## 2019-12-25 DIAGNOSIS — R079 Chest pain, unspecified: Secondary | ICD-10-CM | POA: Diagnosis not present

## 2019-12-25 DIAGNOSIS — I10 Essential (primary) hypertension: Secondary | ICD-10-CM | POA: Diagnosis not present

## 2019-12-25 DIAGNOSIS — N186 End stage renal disease: Secondary | ICD-10-CM | POA: Diagnosis not present

## 2019-12-25 DIAGNOSIS — E782 Mixed hyperlipidemia: Secondary | ICD-10-CM

## 2019-12-25 MED ORDER — NITROGLYCERIN 0.4 MG SL SUBL: 0.4000 mg | SUBLINGUAL_TABLET | SUBLINGUAL | 3 refills | Status: DC | PRN

## 2019-12-25 MED ORDER — ALBUTEROL SULFATE HFA 108 (90 BASE) MCG/ACT IN AERS: 1.0000 | INHALATION_SPRAY | Freq: Four times a day (QID) | RESPIRATORY_TRACT | 2 refills | Status: DC | PRN

## 2019-12-25 MED ORDER — ATORVASTATIN CALCIUM 40 MG PO TABS: 40.0000 mg | ORAL_TABLET | Freq: Every day | ORAL | 3 refills | Status: DC

## 2019-12-25 MED ORDER — ISOSORBIDE MONONITRATE ER 30 MG PO TB24: 30.0000 mg | ORAL_TABLET | Freq: Every day | ORAL | 3 refills | Status: AC

## 2019-12-25 NOTE — Patient Instructions (Addendum)
Medication Instructions:  Albuterol as needed Nitro under the tongue as needed  refilled lipitor/imdur  If you need a refill on your cardiac medications before your next appointment, please call your pharmacy.    Lab work: No new labs needed   If you have labs (blood work) drawn today and your tests are completely normal, you will receive your results only by: Marland Kitchen MyChart Message (if you have MyChart) OR . A paper copy in the mail If you have any lab test that is abnormal or we need to change your treatment, we will call you to review the results.   Testing/Procedures: No new testing needed   Follow-Up: At [facility], you and your health needs are our priority.  As part of our continuing mission to provide you with exceptional heart care, we have created designated Provider Care Teams.  These Care Teams include your primary Cardiologist (physician) and Advanced Practice Providers (APPs -  Physician Assistants and Nurse Practitioners) who all work together to provide you with the care you need, when you need it.  . You will need a follow up appointment in 6 months  . Providers on your designated Care Team:   . Murray Hodgkins, NP . Christell Faith, PA-C . Marrianne Mood, PA-C  Any Other Special Instructions Will Be Listed Below (If Applicable).  COVID-19 Vaccine Information can be found at: [email or web address] For questions related to vaccine distribution or appointments, please email [email or web address] or call [phone].
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
## 2020-02-01 NOTE — Telephone Encounter (Signed)
Closing encounter

## 2020-02-27 DIAGNOSIS — N186 End stage renal disease: Secondary | ICD-10-CM | POA: Diagnosis not present

## 2020-02-27 DIAGNOSIS — Z0000 Encounter for general adult medical examination without abnormal findings: Secondary | ICD-10-CM | POA: Diagnosis not present

## 2020-02-27 DIAGNOSIS — R351 Nocturia: Secondary | ICD-10-CM | POA: Diagnosis not present

## 2020-02-27 DIAGNOSIS — Z6824 Body mass index (BMI) 24.0-24.9, adult: Secondary | ICD-10-CM | POA: Diagnosis not present

## 2020-02-27 DIAGNOSIS — R81 Glycosuria: Secondary | ICD-10-CM | POA: Diagnosis not present

## 2020-02-27 DIAGNOSIS — Z1211 Encounter for screening for malignant neoplasm of colon: Secondary | ICD-10-CM | POA: Diagnosis not present

## 2020-02-27 DIAGNOSIS — Z992 Dependence on renal dialysis: Secondary | ICD-10-CM | POA: Diagnosis not present

## 2020-02-27 DIAGNOSIS — Z1331 Encounter for screening for depression: Secondary | ICD-10-CM | POA: Diagnosis not present

## 2020-03-19 ENCOUNTER — Ambulatory Visit: Payer: Self-pay | Admitting: Urology

## 2020-06-03 DIAGNOSIS — E785 Hyperlipidemia, unspecified: Secondary | ICD-10-CM | POA: Diagnosis not present

## 2020-06-03 DIAGNOSIS — Z87891 Personal history of nicotine dependence: Secondary | ICD-10-CM | POA: Diagnosis not present

## 2020-06-03 DIAGNOSIS — Z1331 Encounter for screening for depression: Secondary | ICD-10-CM | POA: Diagnosis not present

## 2020-06-03 DIAGNOSIS — N184 Chronic kidney disease, stage 4 (severe): Secondary | ICD-10-CM | POA: Diagnosis not present

## 2020-06-03 DIAGNOSIS — I10 Essential (primary) hypertension: Secondary | ICD-10-CM | POA: Diagnosis not present

## 2020-06-17 ENCOUNTER — Ambulatory Visit: Admitting: Cardiovascular Disease

## 2020-07-22 ENCOUNTER — Ambulatory Visit: Payer: Medicare HMO | Admitting: Cardiovascular Disease

## 2020-08-19 ENCOUNTER — Other Ambulatory Visit: Payer: Self-pay

## 2020-08-19 ENCOUNTER — Encounter: Payer: Self-pay | Admitting: Cardiovascular Disease

## 2020-08-19 ENCOUNTER — Ambulatory Visit (INDEPENDENT_AMBULATORY_CARE_PROVIDER_SITE_OTHER): Admitting: Cardiovascular Disease

## 2020-08-19 VITALS — BP 128/62 | HR 71 | Resp 16 | Ht 72.0 in | Wt 166.2 lb

## 2020-08-19 DIAGNOSIS — F17200 Nicotine dependence, unspecified, uncomplicated: Secondary | ICD-10-CM

## 2020-08-19 DIAGNOSIS — R0602 Shortness of breath: Secondary | ICD-10-CM

## 2020-08-19 DIAGNOSIS — I70213 Atherosclerosis of native arteries of extremities with intermittent claudication, bilateral legs: Secondary | ICD-10-CM

## 2020-08-19 DIAGNOSIS — E782 Mixed hyperlipidemia: Secondary | ICD-10-CM

## 2020-08-19 DIAGNOSIS — R079 Chest pain, unspecified: Secondary | ICD-10-CM

## 2020-08-19 DIAGNOSIS — N186 End stage renal disease: Secondary | ICD-10-CM | POA: Diagnosis not present

## 2020-08-19 DIAGNOSIS — I10 Essential (primary) hypertension: Secondary | ICD-10-CM

## 2020-08-19 MED ORDER — NITROGLYCERIN 0.4 MG SL SUBL: 0.4000 mg | SUBLINGUAL_TABLET | SUBLINGUAL | 3 refills | Status: AC | PRN

## 2020-08-19 NOTE — Patient Instructions (Signed)
Medication Instructions:  No changes  If you need a refill on your cardiac medications before your next appointment, please call your pharmacy.    Lab work: No new labs needed   If you have labs (blood work) drawn today and your tests are completely normal, you will receive your results only by: . MyChart Message (if you have MyChart) OR . A paper copy in the mail If you have any lab test that is abnormal or we need to change your treatment, we will call you to review the results.   Testing/Procedures: No new testing needed   Follow-Up: At [facility], you and your health needs are our priority.  As part of our continuing mission to provide you with exceptional heart care, we have created designated Provider Care Teams.  These Care Teams include your primary Cardiologist (physician) and Advanced Practice Providers (APPs -  Physician Assistants and Nurse Practitioners) who all work together to provide you with the care you need, when you need it.  . You will need a follow up appointment as needed  . Providers on your designated Care Team:   . Christopher Berge, NP . Ryan Dunn, PA-C . Jacquelyn Visser, PA-C  Any Other Special Instructions Will Be Listed Below (If Applicable).  COVID-19 Vaccine Information can be found at: [email or web address] For questions related to vaccine distribution or appointments, please email [email or web address] or call [phone].     

## 2020-08-19 NOTE — Progress Notes (Signed)
Date:  08/19/2020   ID:  Geoffrey Rangel, DOB Jul 08, 1960, MRN [id number]  Patient Location:  [place] [address]   Provider location:   [facility], [place] office  PCP:  Patient, No Pcp Per (Inactive)  Cardiologist:  Patsy Baltimore  Chief Complaint  Patient presents with  . 6 month follow up     Patient c/o chest pain that radiates through to his back with left arm pain that comes and goes. Medications reviewed by the patient verbally.     History of Present Illness:    Geoffrey Rangel is a 60 y.o. male  past medical history of HTN,  HLD,  ESRD since 06/27/18 presumed secondary to hypertension.  on in-center hemodialysis through an AVF Smoker, has smoked 1 -2 ppd for almost 53 years Who presents for HTN, chest pain  I quit dialysis "I make urine" Hospice coming to house If something happens, wife is to call hospice He does not want to go to the ER for HD  Does not want peritoneal dialysis  No orthostasis, Dizzy when working on [employer] blades   continues to smoke 1 to 2 packs/day, has been doing so for 40+  years  Chronic SOB  EKG personally reviewed by myself on todays visit Shows normal sinus rhythm rate 69 bpm no significant ST-T wave changes   Stress test 05/2018, reviewed with him  There was no ST segment deviation noted during stress.  The study is normal.  This is a low risk study.  The left ventricular ejection fraction is mildly decreased (45-54%).   In the hospital in [place] 05/2018, chest pain Initial concern for possible STEMI but serial trops remained negative and chest pain resolved.  Cath deferred due to progressive CKD.  NM stress test without ischemia.   Echocardiogram unremarkable with normal EF.    Prior CV studies:   The following studies were reviewed today:    Past Medical History:  Diagnosis Date  . Arthritis   . Chronic kidney disease   . Dyspnea    occasional  . GERD  (gastroesophageal reflux disease)   . Hyperlipidemia   . Hypertension   . Renal disorder    Past Surgical History:  Procedure Laterality Date  . AV FISTULA PLACEMENT Right 09/13/2018   Procedure: ARTERIOVENOUS (AV) FISTULA CREATION ( BRACHIAL CEPHALIC );  Surgeon: Algernon Huxley, MD;  Location: [facility] ORS;  Service: Vascular;  Laterality: Right;  . DIALYSIS/PERMA CATHETER INSERTION N/A 06/19/2018   Procedure: DIALYSIS/PERMA CATHETER INSERTION;  Surgeon: Algernon Huxley, MD;  Location: [place] [facility];  Service: Cardiovascular;  Laterality: N/A;  . DIALYSIS/PERMA CATHETER INSERTION N/A 07/19/2018   Procedure: DIALYSIS/PERMA CATHETER INSERTION;  Surgeon: Katha Cabal, MD;  Location: [place] [facility];  Service: Cardiovascular;  Laterality: N/A;  . DIALYSIS/PERMA CATHETER INSERTION N/A 09/06/2018   Procedure: DIALYSIS/PERMA CATHETER INSERTION;  Surgeon: Katha Cabal, MD;  Location: [place] [facility];  Service: Cardiovascular;  Laterality: N/A;  . DIALYSIS/PERMA CATHETER REMOVAL N/A 12/06/2018   Procedure: DIALYSIS/PERMA CATHETER REMOVAL;  Surgeon: Katha Cabal, MD;  Location: [place] [facility];  Service: Cardiovascular;  Laterality: N/A;  . RENAL BIOPSY, PERCUTANEOUS  09/17/2016         Current Meds  Medication Sig  . amLODipine (NORVASC) 2.5 MG tablet Take 2.5 mg by mouth daily.  . haloperidol (HALDOL) 0.5 MG tablet Take 0.5 mg by mouth every  6 (six) hours as needed.  . isosorbide mononitrate (IMDUR) 30 MG 24 hr tablet Take 1 tablet (30 mg total) by mouth daily.     Allergies:   Other   Social History   Tobacco Use  . Smoking status: Current Every Day Smoker    Packs/day: 1.00    Years: 34.00    Pack years: 34.00  . Smokeless tobacco: Former Systems developer    Types: Snuff    Quit date: 09/12/2015  Vaping Use  . Vaping Use: Never used  Substance Use Topics  . Alcohol use: Never  . Drug use: Never     Current Outpatient Medications on File Prior to Visit  Medication  Sig Dispense Refill  . amLODipine (NORVASC) 2.5 MG tablet Take 2.5 mg by mouth daily.    . haloperidol (HALDOL) 0.5 MG tablet Take 0.5 mg by mouth every 6 (six) hours as needed.    . isosorbide mononitrate (IMDUR) 30 MG 24 hr tablet Take 1 tablet (30 mg total) by mouth daily. 90 tablet 3  . nitroGLYCERIN (NITROSTAT) 0.4 MG SL tablet Place 1 tablet (0.4 mg total) under the tongue every 5 (five) minutes as needed for chest pain. 25 tablet 3   No current facility-administered medications on file prior to visit.     Family Hx: The patient's family history includes Alcohol abuse in his maternal grandfather; CAD (age of onset: 67) in his father; Chronic Renal Failure in his father; Cirrhosis in his maternal grandfather; Colon cancer in his paternal grandfather; Congestive Heart Failure in his paternal grandmother and sister; Hypertension in his father; Kidney disease in his sister.  ROS:   Please see the history of present illness.    Review of Systems  Constitutional: Negative.   Respiratory: Positive for shortness of breath.   Cardiovascular: Negative.   Gastrointestinal: Negative.   Musculoskeletal: Negative.   Neurological: Negative.   Psychiatric/Behavioral: Negative.   All other systems reviewed and are negative.    Labs/Other Tests and Data Reviewed:    Recent Labs: No results found for requested labs within last 8760 hours.   Recent Lipid Panel Lab Results  Component Value Date/Time   CHOL 197 06/08/2018 10:26 AM   TRIG 149 06/08/2018 10:26 AM   HDL 33 (L) 06/08/2018 10:26 AM   CHOLHDL 6.0 06/08/2018 10:26 AM   LDLCALC 134 (H) 06/08/2018 10:26 AM    Wt Readings from Last 3 Encounters:  08/19/20 166 lb 3.2 oz (75.4 kg)  12/25/19 180 lb 6.4 oz (81.8 kg)  05/04/19 177 lb (80.3 kg)     Exam:    Vital Signs: Vital signs may also be detailed in the HPI BP 128/62 (BP Location: Left Arm, Patient Position: Sitting, Cuff Size: Normal)   Pulse 71   Resp 16   Ht 6' (1.829 m)    Wt 166 lb 3.2 oz (75.4 kg)   SpO2 97%   BMI 22.54 kg/m    Constitutional:  oriented to person, place, and time. No distress.  HENT:  Head: Grossly normal Eyes:  no discharge. No scleral icterus.  Neck: No JVD, no carotid bruits  Cardiovascular: Regular rate and rhythm, no murmurs appreciated Pulmonary/Chest: Clear to auscultation bilaterally, no wheezes or rails Abdominal: Soft.  no distension.  no tenderness.  Musculoskeletal: Normal range of motion Neurological:  normal muscle tone. Coordination normal. No atrophy Skin: Skin warm and dry Psychiatric: normal affect, pleasant   ASSESSMENT & PLAN:    End-stage renal disease on hemodialysis Off HD  by choice Does not want emergency hemodialysis or peritoneal dialysis or transplant On hospice  Chest pain Atypical pain, no further workup, he is on hospice Nitro provided for comfort  Essential hypertension Blood pressure is well controlled on today's visit. No changes made to the medications.  Mixed hyperlipidemia Off lipitor  COPD Smoking cessation recommended, strategies discussed   Total encounter time more than 25 minutes  Greater than 50% was spent in counseling and coordination of care with the patient   Signed, Ida Rogue, MD  08/19/2020 9:08 AM    [place] [facility] [address], [place], [place] [address]

## 2020-09-09 DIAGNOSIS — Z87442 Personal history of urinary calculi: Secondary | ICD-10-CM | POA: Diagnosis not present

## 2020-09-09 DIAGNOSIS — M545 Low back pain, unspecified: Secondary | ICD-10-CM | POA: Diagnosis not present

## 2020-09-09 DIAGNOSIS — Z6841 Body Mass Index (BMI) 40.0 and over, adult: Secondary | ICD-10-CM | POA: Diagnosis not present

## 2020-09-09 DIAGNOSIS — E785 Hyperlipidemia, unspecified: Secondary | ICD-10-CM | POA: Diagnosis not present

## 2020-09-09 DIAGNOSIS — N184 Chronic kidney disease, stage 4 (severe): Secondary | ICD-10-CM | POA: Diagnosis not present

## 2020-09-09 DIAGNOSIS — I10 Essential (primary) hypertension: Secondary | ICD-10-CM | POA: Diagnosis not present

## 2020-09-09 DIAGNOSIS — Z125 Encounter for screening for malignant neoplasm of prostate: Secondary | ICD-10-CM | POA: Diagnosis not present

## 2020-12-12 DIAGNOSIS — N184 Chronic kidney disease, stage 4 (severe): Secondary | ICD-10-CM | POA: Diagnosis not present

## 2020-12-12 DIAGNOSIS — Z23 Encounter for immunization: Secondary | ICD-10-CM | POA: Diagnosis not present

## 2020-12-12 DIAGNOSIS — E785 Hyperlipidemia, unspecified: Secondary | ICD-10-CM | POA: Diagnosis not present

## 2020-12-12 DIAGNOSIS — R252 Cramp and spasm: Secondary | ICD-10-CM | POA: Diagnosis not present

## 2020-12-12 DIAGNOSIS — I10 Essential (primary) hypertension: Secondary | ICD-10-CM | POA: Diagnosis not present

## 2020-12-12 DIAGNOSIS — Z6822 Body mass index (BMI) 22.0-22.9, adult: Secondary | ICD-10-CM | POA: Diagnosis not present

## 2020-12-31 DIAGNOSIS — N186 End stage renal disease: Secondary | ICD-10-CM | POA: Diagnosis not present

## 2021-01-06 ENCOUNTER — Emergency Department

## 2021-01-06 ENCOUNTER — Inpatient Hospital Stay
Admission: EM | Admit: 2021-01-06 | Discharge: 2021-01-10 | DRG: 521 | Disposition: A | Discharge status: Hospice | Attending: Family Medicine | Admitting: Family Medicine

## 2021-01-06 ENCOUNTER — Inpatient Hospital Stay

## 2021-01-06 ENCOUNTER — Other Ambulatory Visit: Payer: Self-pay

## 2021-01-06 ENCOUNTER — Inpatient Hospital Stay: Admitting: Anesthesiology

## 2021-01-06 ENCOUNTER — Encounter
Admission: EM | Disposition: A | Discharge status: Hospice | Payer: Self-pay | Source: Home / Self Care | Attending: Family Medicine

## 2021-01-06 DIAGNOSIS — R1110 Vomiting, unspecified: Secondary | ICD-10-CM | POA: Diagnosis not present

## 2021-01-06 DIAGNOSIS — R0689 Other abnormalities of breathing: Secondary | ICD-10-CM | POA: Diagnosis not present

## 2021-01-06 DIAGNOSIS — M25511 Pain in right shoulder: Secondary | ICD-10-CM | POA: Diagnosis present

## 2021-01-06 DIAGNOSIS — I70209 Unspecified atherosclerosis of native arteries of extremities, unspecified extremity: Secondary | ICD-10-CM | POA: Diagnosis present

## 2021-01-06 DIAGNOSIS — Z79899 Other long term (current) drug therapy: Secondary | ICD-10-CM

## 2021-01-06 DIAGNOSIS — S72001A Fracture of unspecified part of neck of right femur, initial encounter for closed fracture: Secondary | ICD-10-CM | POA: Diagnosis not present

## 2021-01-06 DIAGNOSIS — N186 End stage renal disease: Secondary | ICD-10-CM | POA: Diagnosis not present

## 2021-01-06 DIAGNOSIS — D72829 Elevated white blood cell count, unspecified: Secondary | ICD-10-CM | POA: Diagnosis present

## 2021-01-06 DIAGNOSIS — Z6822 Body mass index (BMI) 22.0-22.9, adult: Secondary | ICD-10-CM

## 2021-01-06 DIAGNOSIS — X500XXA Overexertion from strenuous movement or load, initial encounter: Secondary | ICD-10-CM | POA: Diagnosis not present

## 2021-01-06 DIAGNOSIS — Z720 Tobacco use: Secondary | ICD-10-CM | POA: Diagnosis present

## 2021-01-06 DIAGNOSIS — S72041A Displaced fracture of base of neck of right femur, initial encounter for closed fracture: Secondary | ICD-10-CM | POA: Diagnosis not present

## 2021-01-06 DIAGNOSIS — R040 Epistaxis: Secondary | ICD-10-CM

## 2021-01-06 DIAGNOSIS — Z841 Family history of disorders of kidney and ureter: Secondary | ICD-10-CM | POA: Diagnosis not present

## 2021-01-06 DIAGNOSIS — M1611 Unilateral primary osteoarthritis, right hip: Secondary | ICD-10-CM | POA: Diagnosis not present

## 2021-01-06 DIAGNOSIS — Z8249 Family history of ischemic heart disease and other diseases of the circulatory system: Secondary | ICD-10-CM

## 2021-01-06 DIAGNOSIS — Z20822 Contact with and (suspected) exposure to covid-19: Secondary | ICD-10-CM | POA: Diagnosis present

## 2021-01-06 DIAGNOSIS — Y92009 Unspecified place in unspecified non-institutional (private) residence as the place of occurrence of the external cause: Secondary | ICD-10-CM | POA: Diagnosis not present

## 2021-01-06 DIAGNOSIS — Z9115 Patient's noncompliance with renal dialysis: Secondary | ICD-10-CM | POA: Diagnosis not present

## 2021-01-06 DIAGNOSIS — W010XXA Fall on same level from slipping, tripping and stumbling without subsequent striking against object, initial encounter: Secondary | ICD-10-CM | POA: Diagnosis present

## 2021-01-06 DIAGNOSIS — E785 Hyperlipidemia, unspecified: Secondary | ICD-10-CM | POA: Diagnosis not present

## 2021-01-06 DIAGNOSIS — I120 Hypertensive chronic kidney disease with stage 5 chronic kidney disease or end stage renal disease: Secondary | ICD-10-CM | POA: Diagnosis present

## 2021-01-06 DIAGNOSIS — N2581 Secondary hyperparathyroidism of renal origin: Secondary | ICD-10-CM | POA: Diagnosis not present

## 2021-01-06 DIAGNOSIS — Z66 Do not resuscitate: Secondary | ICD-10-CM | POA: Diagnosis present

## 2021-01-06 DIAGNOSIS — F17200 Nicotine dependence, unspecified, uncomplicated: Secondary | ICD-10-CM | POA: Diagnosis present

## 2021-01-06 DIAGNOSIS — E1122 Type 2 diabetes mellitus with diabetic chronic kidney disease: Secondary | ICD-10-CM | POA: Diagnosis present

## 2021-01-06 DIAGNOSIS — S79911A Unspecified injury of right hip, initial encounter: Secondary | ICD-10-CM | POA: Diagnosis not present

## 2021-01-06 DIAGNOSIS — G8918 Other acute postprocedural pain: Secondary | ICD-10-CM

## 2021-01-06 DIAGNOSIS — W19XXXA Unspecified fall, initial encounter: Secondary | ICD-10-CM

## 2021-01-06 DIAGNOSIS — I10 Essential (primary) hypertension: Secondary | ICD-10-CM | POA: Diagnosis present

## 2021-01-06 DIAGNOSIS — R112 Nausea with vomiting, unspecified: Secondary | ICD-10-CM | POA: Diagnosis not present

## 2021-01-06 DIAGNOSIS — K219 Gastro-esophageal reflux disease without esophagitis: Secondary | ICD-10-CM | POA: Diagnosis present

## 2021-01-06 DIAGNOSIS — N400 Enlarged prostate without lower urinary tract symptoms: Secondary | ICD-10-CM | POA: Diagnosis present

## 2021-01-06 DIAGNOSIS — D631 Anemia in chronic kidney disease: Secondary | ICD-10-CM | POA: Diagnosis present

## 2021-01-06 DIAGNOSIS — I700 Atherosclerosis of aorta: Secondary | ICD-10-CM

## 2021-01-06 DIAGNOSIS — E440 Moderate protein-calorie malnutrition: Secondary | ICD-10-CM | POA: Insufficient documentation

## 2021-01-06 DIAGNOSIS — Z419 Encounter for procedure for purposes other than remedying health state, unspecified: Secondary | ICD-10-CM

## 2021-01-06 DIAGNOSIS — R52 Pain, unspecified: Secondary | ICD-10-CM | POA: Diagnosis not present

## 2021-01-06 HISTORY — PX: TOTAL HIP ARTHROPLASTY: SHX124

## 2021-01-06 HISTORY — DX: Atherosclerosis of aorta: I70.0

## 2021-01-06 LAB — CBC WITH DIFFERENTIAL/PLATELET
Abs Immature Granulocytes: 0.13 10*3/uL — ABNORMAL HIGH (ref 0.00–0.07)
Basophils Absolute: 0.1 10*3/uL (ref 0.0–0.1)
Basophils Relative: 0 %
Eosinophils Absolute: 0 10*3/uL (ref 0.0–0.5)
Eosinophils Relative: 0 %
HCT: 26.2 % — ABNORMAL LOW (ref 39.0–52.0)
Hemoglobin: 8.5 g/dL — ABNORMAL LOW (ref 13.0–17.0)
Immature Granulocytes: 1 %
Lymphocytes Relative: 6 %
Lymphs Abs: 0.8 10*3/uL (ref 0.7–4.0)
MCH: 29.6 pg (ref 26.0–34.0)
MCHC: 32.4 g/dL (ref 30.0–36.0)
MCV: 91.3 fL (ref 80.0–100.0)
Monocytes Absolute: 0.6 10*3/uL (ref 0.1–1.0)
Monocytes Relative: 5 %
Neutro Abs: 12.4 10*3/uL — ABNORMAL HIGH (ref 1.7–7.7)
Neutrophils Relative %: 88 %
Platelets: 244 10*3/uL (ref 150–400)
RBC: 2.87 MIL/uL — ABNORMAL LOW (ref 4.22–5.81)
RDW: 14.4 % (ref 11.5–15.5)
WBC: 14.1 10*3/uL — ABNORMAL HIGH (ref 4.0–10.5)
nRBC: 0 % (ref 0.0–0.2)

## 2021-01-06 LAB — PHOSPHORUS: Phosphorus: 9.7 mg/dL — ABNORMAL HIGH (ref 2.5–4.6)

## 2021-01-06 LAB — RETICULOCYTES
Immature Retic Fract: 11.2 % (ref 2.3–15.9)
RBC.: 2.95 MIL/uL — ABNORMAL LOW (ref 4.22–5.81)
Retic Count, Absolute: 48.4 10*3/uL (ref 19.0–186.0)
Retic Ct Pct: 1.6 % (ref 0.4–3.1)

## 2021-01-06 LAB — BASIC METABOLIC PANEL
Anion gap: 17 — ABNORMAL HIGH (ref 5–15)
BUN: 106 mg/dL — ABNORMAL HIGH (ref 6–20)
CO2: 12 mmol/L — ABNORMAL LOW (ref 22–32)
Calcium: 8.2 mg/dL — ABNORMAL LOW (ref 8.9–10.3)
Chloride: 110 mmol/L (ref 98–111)
Creatinine, Ser: 10.51 mg/dL — ABNORMAL HIGH (ref 0.61–1.24)
GFR, Estimated: 5 mL/min — ABNORMAL LOW (ref >60)
Glucose, Bld: 107 mg/dL — ABNORMAL HIGH (ref 70–99)
Potassium: 4.4 mmol/L (ref 3.5–5.1)
Sodium: 139 mmol/L (ref 135–145)

## 2021-01-06 LAB — RESP PANEL BY RT-PCR (FLU A&B, COVID) ARPGX2
Influenza A by PCR: NEGATIVE
Influenza B by PCR: NEGATIVE
SARS Coronavirus 2 by RT PCR: NEGATIVE

## 2021-01-06 LAB — PROTIME-INR
INR: 1.2 (ref 0.8–1.2)
Prothrombin Time: 14.7 seconds (ref 11.4–15.2)

## 2021-01-06 LAB — FOLATE: Folate: 14.8 ng/mL (ref >5.9)

## 2021-01-06 LAB — IRON AND TIBC
Iron: 53 ug/dL (ref 45–182)
Saturation Ratios: 17 % — ABNORMAL LOW (ref 17.9–39.5)
TIBC: 312 ug/dL (ref 250–450)
UIBC: 259 ug/dL

## 2021-01-06 LAB — VITAMIN B12: Vitamin B-12: 249 pg/mL (ref 180–914)

## 2021-01-06 LAB — APTT: aPTT: 27 seconds (ref 24–36)

## 2021-01-06 LAB — HEMOGLOBIN AND HEMATOCRIT, BLOOD
HCT: 24.5 % — ABNORMAL LOW (ref 39.0–52.0)
Hemoglobin: 8.1 g/dL — ABNORMAL LOW (ref 13.0–17.0)

## 2021-01-06 LAB — FERRITIN: Ferritin: 85 ng/mL (ref 24–336)

## 2021-01-06 SURGERY — ARTHROPLASTY, HIP, TOTAL, ANTERIOR APPROACH
Anesthesia: General | Site: Hip | Laterality: Right

## 2021-01-06 MED ORDER — ISOSORBIDE MONONITRATE ER 30 MG PO TB24
30.0000 mg | ORAL_TABLET | Freq: Every day | ORAL | Status: DC
  Administered 2021-01-07 – 2021-01-10 (×4): 30 mg via ORAL
  Filled 2021-01-06 (×4): qty 1

## 2021-01-06 MED ORDER — SENNOSIDES-DOCUSATE SODIUM 8.6-50 MG PO TABS: 1.0000 | ORAL_TABLET | Freq: Every evening | ORAL | Status: DC | PRN

## 2021-01-06 MED ORDER — LIDOCAINE HCL (CARDIAC) PF 100 MG/5ML IV SOSY
PREFILLED_SYRINGE | INTRAVENOUS | Status: DC | PRN
  Administered 2021-01-06: 100 mg via INTRAVENOUS

## 2021-01-06 MED ORDER — ONDANSETRON HCL 4 MG/2ML IJ SOLN: 4.0000 mg | Freq: Three times a day (TID) | INTRAMUSCULAR | Status: DC | PRN

## 2021-01-06 MED ORDER — NEOMYCIN-POLYMYXIN B GU 40-200000 IR SOLN
Status: DC | PRN
  Administered 2021-01-06: 2 mL

## 2021-01-06 MED ORDER — OXYCODONE HCL 5 MG PO TABS
5.0000 mg | ORAL_TABLET | Freq: Once | ORAL | Status: AC | PRN
  Administered 2021-01-06: 5 mg via ORAL

## 2021-01-06 MED ORDER — OXYCODONE-ACETAMINOPHEN 5-325 MG PO TABS
1.0000 | ORAL_TABLET | ORAL | Status: DC | PRN
  Administered 2021-01-06 – 2021-01-10 (×15): 1 via ORAL
  Filled 2021-01-06 (×15): qty 1

## 2021-01-06 MED ORDER — CEFAZOLIN SODIUM-DEXTROSE 2-4 GM/100ML-% IV SOLN
INTRAVENOUS | Status: AC
  Filled 2021-01-06: qty 100

## 2021-01-06 MED ORDER — OXYCODONE HCL 5 MG PO TABS
ORAL_TABLET | ORAL | Status: AC
  Filled 2021-01-06: qty 1

## 2021-01-06 MED ORDER — DEXAMETHASONE SODIUM PHOSPHATE 10 MG/ML IJ SOLN
INTRAMUSCULAR | Status: DC | PRN
  Administered 2021-01-06: 10 mg via INTRAVENOUS

## 2021-01-06 MED ORDER — OXYCODONE HCL 5 MG/5ML PO SOLN: 5.0000 mg | Freq: Once | ORAL | Status: AC | PRN

## 2021-01-06 MED ORDER — ONDANSETRON HCL 4 MG PO TABS: 4.0000 mg | ORAL_TABLET | Freq: Four times a day (QID) | ORAL | Status: DC | PRN

## 2021-01-06 MED ORDER — ENOXAPARIN SODIUM 30 MG/0.3ML IJ SOSY: 30.0000 mg | PREFILLED_SYRINGE | INTRAMUSCULAR | Status: DC

## 2021-01-06 MED ORDER — FENTANYL CITRATE (PF) 100 MCG/2ML IJ SOLN
INTRAMUSCULAR | Status: AC
  Filled 2021-01-06: qty 2

## 2021-01-06 MED ORDER — 0.9 % SODIUM CHLORIDE (POUR BTL) OPTIME
TOPICAL | Status: DC | PRN
  Administered 2021-01-06: 200 mL

## 2021-01-06 MED ORDER — HEPARIN SODIUM (PORCINE) 5000 UNIT/ML IJ SOLN
5000.0000 [IU] | Freq: Three times a day (TID) | INTRAMUSCULAR | Status: DC
  Administered 2021-01-07 – 2021-01-09 (×7): 5000 [IU] via SUBCUTANEOUS
  Filled 2021-01-06 (×7): qty 1

## 2021-01-06 MED ORDER — SODIUM CHLORIDE FLUSH 0.9 % IV SOLN
INTRAVENOUS | Status: AC
  Filled 2021-01-06: qty 40

## 2021-01-06 MED ORDER — PHENYLEPHRINE HCL-NACL 20-0.9 MG/250ML-% IV SOLN
INTRAVENOUS | Status: DC | PRN
  Administered 2021-01-06: 50 ug/min via INTRAVENOUS

## 2021-01-06 MED ORDER — NITROGLYCERIN 0.4 MG SL SUBL: 0.4000 mg | SUBLINGUAL_TABLET | SUBLINGUAL | Status: DC | PRN

## 2021-01-06 MED ORDER — ROCURONIUM BROMIDE 100 MG/10ML IV SOLN
INTRAVENOUS | Status: DC | PRN
  Administered 2021-01-06: 50 mg via INTRAVENOUS

## 2021-01-06 MED ORDER — ONDANSETRON HCL 4 MG/2ML IJ SOLN
INTRAMUSCULAR | Status: DC | PRN
  Administered 2021-01-06: 4 mg via INTRAVENOUS

## 2021-01-06 MED ORDER — PROPOFOL 10 MG/ML IV BOLUS
INTRAVENOUS | Status: DC | PRN
  Administered 2021-01-06: 100 mg via INTRAVENOUS

## 2021-01-06 MED ORDER — FENTANYL CITRATE (PF) 100 MCG/2ML IJ SOLN
25.0000 ug | INTRAMUSCULAR | Status: DC | PRN
  Administered 2021-01-06 (×2): 25 ug via INTRAVENOUS

## 2021-01-06 MED ORDER — ALUM & MAG HYDROXIDE-SIMETH 200-200-20 MG/5ML PO SUSP: 30.0000 mL | ORAL | Status: DC | PRN

## 2021-01-06 MED ORDER — FENTANYL CITRATE (PF) 100 MCG/2ML IJ SOLN
INTRAMUSCULAR | Status: AC
  Administered 2021-01-06: 25 ug via INTRAVENOUS
  Filled 2021-01-06: qty 2

## 2021-01-06 MED ORDER — FENTANYL CITRATE (PF) 100 MCG/2ML IJ SOLN
INTRAMUSCULAR | Status: DC | PRN
  Administered 2021-01-06 (×2): 50 ug via INTRAVENOUS

## 2021-01-06 MED ORDER — BUPIVACAINE-EPINEPHRINE (PF) 0.25% -1:200000 IJ SOLN
INTRAMUSCULAR | Status: AC
  Filled 2021-01-06: qty 30

## 2021-01-06 MED ORDER — METHOCARBAMOL 500 MG PO TABS
500.0000 mg | ORAL_TABLET | Freq: Three times a day (TID) | ORAL | Status: DC | PRN
  Administered 2021-01-07 – 2021-01-10 (×5): 500 mg via ORAL
  Filled 2021-01-06 (×6): qty 1

## 2021-01-06 MED ORDER — NICOTINE 21 MG/24HR TD PT24
21.0000 mg | MEDICATED_PATCH | Freq: Every day | TRANSDERMAL | Status: DC
  Administered 2021-01-06 – 2021-01-10 (×5): 21 mg via TRANSDERMAL
  Filled 2021-01-06 (×5): qty 1

## 2021-01-06 MED ORDER — AMLODIPINE BESYLATE 5 MG PO TABS
2.5000 mg | ORAL_TABLET | Freq: Every day | ORAL | Status: DC
  Administered 2021-01-07 – 2021-01-10 (×4): 2.5 mg via ORAL
  Filled 2021-01-06 (×4): qty 1

## 2021-01-06 MED ORDER — SODIUM CHLORIDE 0.9 % IV SOLN
25.0000 mg | Freq: Four times a day (QID) | INTRAVENOUS | Status: DC | PRN
  Filled 2021-01-06 (×2): qty 1

## 2021-01-06 MED ORDER — ACETAMINOPHEN 10 MG/ML IV SOLN
INTRAVENOUS | Status: AC
  Filled 2021-01-06: qty 100

## 2021-01-06 MED ORDER — ACETAMINOPHEN 10 MG/ML IV SOLN
INTRAVENOUS | Status: DC | PRN
  Administered 2021-01-06: 1000 mg via INTRAVENOUS

## 2021-01-06 MED ORDER — BUPIVACAINE LIPOSOME 1.3 % IJ SUSP
INTRAMUSCULAR | Status: AC
  Filled 2021-01-06: qty 20

## 2021-01-06 MED ORDER — GABAPENTIN 100 MG PO CAPS
100.0000 mg | ORAL_CAPSULE | Freq: Three times a day (TID) | ORAL | Status: DC
  Administered 2021-01-06 – 2021-01-10 (×12): 100 mg via ORAL
  Filled 2021-01-06 (×12): qty 1

## 2021-01-06 MED ORDER — ONDANSETRON HCL 4 MG/2ML IJ SOLN
4.0000 mg | Freq: Four times a day (QID) | INTRAMUSCULAR | Status: DC | PRN
  Administered 2021-01-06 – 2021-01-09 (×3): 4 mg via INTRAVENOUS
  Filled 2021-01-06 (×3): qty 2

## 2021-01-06 MED ORDER — SODIUM CHLORIDE (PF) 0.9 % IJ SOLN
INTRAMUSCULAR | Status: DC | PRN
  Administered 2021-01-06: 90 mL via PERINEURAL

## 2021-01-06 MED ORDER — DIPHENHYDRAMINE HCL 12.5 MG/5ML PO ELIX: 12.5000 mg | ORAL_SOLUTION | ORAL | Status: DC | PRN

## 2021-01-06 MED ORDER — CEFAZOLIN (ANCEF) 1 G IV SOLR: 2.0000 g | INTRAVENOUS | Status: DC

## 2021-01-06 MED ORDER — ALBUTEROL SULFATE (2.5 MG/3ML) 0.083% IN NEBU: 3.0000 mL | INHALATION_SOLUTION | RESPIRATORY_TRACT | Status: DC | PRN

## 2021-01-06 MED ORDER — MORPHINE SULFATE (PF) 2 MG/ML IV SOLN
2.0000 mg | INTRAVENOUS | Status: DC | PRN
  Administered 2021-01-06 – 2021-01-10 (×6): 2 mg via INTRAVENOUS
  Filled 2021-01-06 (×6): qty 1

## 2021-01-06 MED ORDER — METOCLOPRAMIDE HCL 5 MG/ML IJ SOLN
10.0000 mg | Freq: Once | INTRAMUSCULAR | Status: AC
  Administered 2021-01-06: 10 mg via INTRAVENOUS

## 2021-01-06 MED ORDER — SODIUM CHLORIDE 0.9 % IV SOLN: INTRAVENOUS | Status: DC

## 2021-01-06 MED ORDER — DM-GUAIFENESIN ER 30-600 MG PO TB12
1.0000 | ORAL_TABLET | Freq: Two times a day (BID) | ORAL | Status: DC | PRN
  Filled 2021-01-06: qty 1

## 2021-01-06 MED ORDER — MENTHOL 3 MG MT LOZG
1.0000 | LOZENGE | OROMUCOSAL | Status: DC | PRN
  Filled 2021-01-06: qty 9

## 2021-01-06 MED ORDER — BISACODYL 10 MG RE SUPP: 10.0000 mg | Freq: Every day | RECTAL | Status: DC | PRN

## 2021-01-06 MED ORDER — PHENOL 1.4 % MT LIQD
1.0000 | OROMUCOSAL | Status: DC | PRN
  Filled 2021-01-06: qty 177

## 2021-01-06 MED ORDER — PROPOFOL 10 MG/ML IV BOLUS
INTRAVENOUS | Status: AC
  Filled 2021-01-06: qty 20

## 2021-01-06 MED ORDER — SUGAMMADEX SODIUM 200 MG/2ML IV SOLN
INTRAVENOUS | Status: DC | PRN
Start: 2021-01-06 — End: 2021-01-06
  Administered 2021-01-06: 156.8 mg via INTRAVENOUS

## 2021-01-06 MED ORDER — MORPHINE SULFATE (PF) 4 MG/ML IV SOLN
4.0000 mg | Freq: Once | INTRAVENOUS | Status: AC
  Administered 2021-01-06: 4 mg via INTRAVENOUS
  Filled 2021-01-06: qty 1

## 2021-01-06 MED ORDER — DOCUSATE SODIUM 100 MG PO CAPS
100.0000 mg | ORAL_CAPSULE | Freq: Two times a day (BID) | ORAL | Status: DC
  Administered 2021-01-07 – 2021-01-09 (×5): 100 mg via ORAL
  Filled 2021-01-06 (×6): qty 1

## 2021-01-06 MED ORDER — EPHEDRINE SULFATE 50 MG/ML IJ SOLN
INTRAMUSCULAR | Status: DC | PRN
  Administered 2021-01-06: 10 mg via INTRAVENOUS

## 2021-01-06 MED ORDER — NEOMYCIN-POLYMYXIN B GU 40-200000 IR SOLN
Status: AC
  Filled 2021-01-06: qty 40

## 2021-01-06 MED ORDER — TAMSULOSIN HCL 0.4 MG PO CAPS
0.4000 mg | ORAL_CAPSULE | Freq: Every day | ORAL | Status: DC
  Administered 2021-01-06 – 2021-01-10 (×5): 0.4 mg via ORAL
  Filled 2021-01-06 (×5): qty 1

## 2021-01-06 MED ORDER — ACETAMINOPHEN 325 MG PO TABS
650.0000 mg | ORAL_TABLET | Freq: Four times a day (QID) | ORAL | Status: DC | PRN
  Administered 2021-01-09: 650 mg via ORAL
  Filled 2021-01-06: qty 2

## 2021-01-06 MED ORDER — CEFAZOLIN SODIUM-DEXTROSE 2-4 GM/100ML-% IV SOLN
2.0000 g | INTRAVENOUS | Status: AC
  Administered 2021-01-06: 2 g via INTRAVENOUS

## 2021-01-06 MED ORDER — HYDRALAZINE HCL 20 MG/ML IJ SOLN
5.0000 mg | INTRAMUSCULAR | Status: DC | PRN
  Filled 2021-01-06: qty 1

## 2021-01-06 MED ORDER — CEFAZOLIN SODIUM-DEXTROSE 1-4 GM/50ML-% IV SOLN
1.0000 g | Freq: Four times a day (QID) | INTRAVENOUS | Status: AC
  Administered 2021-01-07 (×2): 1 g via INTRAVENOUS
  Filled 2021-01-06 (×2): qty 50

## 2021-01-06 MED ORDER — ROSUVASTATIN CALCIUM 5 MG PO TABS
5.0000 mg | ORAL_TABLET | Freq: Every day | ORAL | Status: DC
  Administered 2021-01-06 – 2021-01-10 (×5): 5 mg via ORAL
  Filled 2021-01-06 (×5): qty 1

## 2021-01-06 SURGICAL SUPPLY — 67 items
BLADE SAGITTAL AGGR TOOTH XLG (BLADE) ×2 IMPLANT
BNDG COHESIVE 6X5 TAN ST LF (GAUZE/BANDAGES/DRESSINGS) ×6 IMPLANT
CANISTER WOUND CARE 500ML ATS (WOUND CARE) ×2 IMPLANT
CEMENT BONE 40GM (Cement) ×4 IMPLANT
CEMENT RESTRICTOR DEPUY SZ 4 (Cement) ×2 IMPLANT
CHLORAPREP W/TINT 26 (MISCELLANEOUS) ×2 IMPLANT
COVER BACK TABLE REUSABLE LG (DRAPES) ×2 IMPLANT
DRAPE 3/4 80X56 (DRAPES) ×6 IMPLANT
DRAPE C-ARM XRAY 36X54 (DRAPES) ×2 IMPLANT
DRAPE INCISE IOBAN 66X60 STRL (DRAPES) IMPLANT
DRAPE POUCH INSTRU U-SHP 10X18 (DRAPES) ×2 IMPLANT
DRESSING SURGICEL FIBRLLR 1X2 (HEMOSTASIS) IMPLANT
DRSG MEPILEX SACRM 8.7X9.8 (GAUZE/BANDAGES/DRESSINGS) IMPLANT
DRSG OPSITE POSTOP 4X8 (GAUZE/BANDAGES/DRESSINGS) IMPLANT
DRSG SURGICEL FIBRILLAR 1X2 (HEMOSTASIS)
ELECT BLADE 6.5 EXT (BLADE) ×2 IMPLANT
ELECT REM PT RETURN 9FT ADLT (ELECTROSURGICAL) ×2
ELECTRODE REM PT RTRN 9FT ADLT (ELECTROSURGICAL) ×1 IMPLANT
GAUZE 4X4 16PLY ~~LOC~~+RFID DBL (SPONGE) ×2 IMPLANT
GLOVE SURG SYN 9.0  PF PI (GLOVE) ×4
GLOVE SURG SYN 9.0 PF PI (GLOVE) ×2 IMPLANT
GLOVE SURG UNDER POLY LF SZ9 (GLOVE) ×2 IMPLANT
GOWN SRG 2XL LVL 4 RGLN SLV (GOWNS) ×1 IMPLANT
GOWN STRL NON-REIN 2XL LVL4 (GOWNS) ×2
GOWN STRL REUS W/ TWL LRG LVL3 (GOWN DISPOSABLE) ×1 IMPLANT
GOWN STRL REUS W/TWL LRG LVL3 (GOWN DISPOSABLE) ×2
HANDLE YANKAUER SUCT BULB TIP (MISCELLANEOUS) ×2 IMPLANT
HEMOVAC 400CC 10FR (MISCELLANEOUS) IMPLANT
HIP FEM HD L 28 (Head) ×2 IMPLANT
IRRIGATION SURGIPHOR STRL (IV SOLUTION) IMPLANT
KIT PREVENA INCISION MGT 13 (CANNISTER) ×2 IMPLANT
KNIFE SCULPS 14X20 (INSTRUMENTS) ×2 IMPLANT
LINER DML 28MM HIGHCROSS (Liner) ×2 IMPLANT
MANIFOLD NEPTUNE II (INSTRUMENTS) ×2 IMPLANT
MAT ABSORB  FLUID 56X50 GRAY (MISCELLANEOUS) ×2
MAT ABSORB FLUID 56X50 GRAY (MISCELLANEOUS) ×1 IMPLANT
NDL SAFETY ECLIPSE 18X1.5 (NEEDLE) ×1 IMPLANT
NEEDLE HYPO 18GX1.5 SHARP (NEEDLE) ×2
NEEDLE SPNL 20GX3.5 QUINCKE YW (NEEDLE) ×4 IMPLANT
NS IRRIG 1000ML POUR BTL (IV SOLUTION) ×2 IMPLANT
PACK HIP COMPR (MISCELLANEOUS) ×2 IMPLANT
PRESSURIZER FEM CANAL M (MISCELLANEOUS) ×2 IMPLANT
RESTRICTOR CEMENT SZ 5 C-STEM (Cement) ×2 IMPLANT
SCALPEL PROTECTED #10 DISP (BLADE) ×4 IMPLANT
SHELL ACETABULAR DM  60MM (Shell) ×2 IMPLANT
SOL PREP PVP 2OZ (MISCELLANEOUS)
SOLUTION PREP PVP 2OZ (MISCELLANEOUS) IMPLANT
SPONGE DRAIN TRACH 4X4 STRL 2S (GAUZE/BANDAGES/DRESSINGS) IMPLANT
SPONGE T-LAP 18X18 ~~LOC~~+RFID (SPONGE) ×4 IMPLANT
STAPLER SKIN PROX 35W (STAPLE) ×2 IMPLANT
STEM FEM HIP SZ4 STD (Stem) ×2 IMPLANT
STRAP SAFETY 5IN WIDE (MISCELLANEOUS) ×2 IMPLANT
SUT DVC 2 QUILL PDO  T11 36X36 (SUTURE) ×2
SUT DVC 2 QUILL PDO T11 36X36 (SUTURE) ×1 IMPLANT
SUT SILK 0 (SUTURE) ×2
SUT SILK 0 30XBRD TIE 6 (SUTURE) ×1 IMPLANT
SUT V-LOC 90 ABS DVC 3-0 CL (SUTURE) ×2 IMPLANT
SUT VIC AB 1 CT1 36 (SUTURE) ×2 IMPLANT
SYR 20ML LL LF (SYRINGE) ×2 IMPLANT
SYR 30ML LL (SYRINGE) ×2 IMPLANT
SYR 50ML LL SCALE MARK (SYRINGE) ×4 IMPLANT
SYR BULB IRRIG 60ML STRL (SYRINGE) ×2 IMPLANT
TAPE MICROFOAM 4IN (TAPE) IMPLANT
TOWEL OR 17X26 4PK STRL BLUE (TOWEL DISPOSABLE) ×2 IMPLANT
TOWER CARTRIDGE SMART MIX (DISPOSABLE) ×2 IMPLANT
TRAY FOLEY MTR SLVR 16FR STAT (SET/KITS/TRAYS/PACK) IMPLANT
WATER STERILE IRR 500ML POUR (IV SOLUTION) ×2 IMPLANT

## 2021-01-06 NOTE — ED Triage Notes (Addendum)
Pt BIBA from home s/p mechanical fall from tripping on blanket while trying to move TV. Pt landed on R hip & shoulder endorsing 9/10 pain to R hip. CMS intact to BLE. Able to move RUE ad lib.    Given '8mg'$  zofran & 148mg fent from EMS. Arrives actively vomiting.    Pt is also non compliant dialysis patient. Last dialysis in November.

## 2021-01-06 NOTE — ED Notes (Signed)
Surgeon at bedside.  

## 2021-01-06 NOTE — ED Provider Notes (Signed)
[facility] Emergency Department Provider Note  ____________________________________________  Time seen: Approximately 5:06 AM  I have reviewed the triage vital signs and the nursing notes.   HISTORY  Chief Complaint Fall   HPI Geoffrey Rangel is a 59 y.o. male who presents for evaluation after fall.  Patient reports that he is in the process of moving and was moving furniture when he tripped on his dog's blanket and fell onto the right side.  He is complaining of severe sharp constant right hip pain and mild right shoulder pain.  He denies head trauma or LOC.  No neck pain or back pain.  He does not take any blood thinners.  Patient received fentanyl and Zofran per EMS and arrives actively vomiting and complaining of severe pain.  Past Medical History:  Diagnosis Date   Arthritis    Chronic kidney disease    Dyspnea    occasional   GERD (gastroesophageal reflux disease)    Hyperlipidemia    Hypertension    Renal disorder     Patient Active Problem List   Diagnosis Date Noted   Atherosclerosis of native arteries of extremity with intermittent claudication ([place]) 10/15/2018   SOB (shortness of breath) 09/04/2018   Healthcare maintenance 08/08/2018   End stage renal disease ([place]) 08/08/2018   Essential hypertension 08/07/2018   Hyperlipidemia 08/07/2018   Chronic kidney disease, stage V ([place]) 06/09/2018   Prostate enlargement 06/09/2018   Chest pain 06/08/2018   Proteinuria 09/17/2016    Past Surgical History:  Procedure Laterality Date   AV FISTULA PLACEMENT Right 09/13/2018   Procedure: ARTERIOVENOUS (AV) FISTULA CREATION ( BRACHIAL CEPHALIC );  Surgeon: Algernon Huxley, MD;  Location: [facility] ORS;  Service: Vascular;  Laterality: Right;   DIALYSIS/PERMA CATHETER INSERTION N/A 06/19/2018   Procedure: DIALYSIS/PERMA CATHETER INSERTION;  Surgeon: Algernon Huxley, MD;  Location: [place] [facility];  Service: Cardiovascular;  Laterality: N/A;    DIALYSIS/PERMA CATHETER INSERTION N/A 07/19/2018   Procedure: DIALYSIS/PERMA CATHETER INSERTION;  Surgeon: Katha Cabal, MD;  Location: [place] [facility];  Service: Cardiovascular;  Laterality: N/A;   DIALYSIS/PERMA CATHETER INSERTION N/A 09/06/2018   Procedure: DIALYSIS/PERMA CATHETER INSERTION;  Surgeon: Katha Cabal, MD;  Location: [place] [facility];  Service: Cardiovascular;  Laterality: N/A;   DIALYSIS/PERMA CATHETER REMOVAL N/A 12/06/2018   Procedure: DIALYSIS/PERMA CATHETER REMOVAL;  Surgeon: Katha Cabal, MD;  Location: [place] [facility];  Service: Cardiovascular;  Laterality: N/A;   RENAL BIOPSY, PERCUTANEOUS  09/17/2016        Prior to Admission medications   Medication Sig Start Date End Date Taking? Authorizing Provider  amLODipine (NORVASC) 2.5 MG tablet Take 2.5 mg by mouth daily. 05/14/20   [provider]  haloperidol (HALDOL) 0.5 MG tablet Take 0.5 mg by mouth every 6 (six) hours as needed. 03/20/20   [provider]  isosorbide mononitrate (IMDUR) 30 MG 24 hr tablet Take 1 tablet (30 mg total) by mouth daily. 12/25/19   Minna Merritts, MD  nitroGLYCERIN (NITROSTAT) 0.4 MG SL tablet Place 1 tablet (0.4 mg total) under the tongue every 5 (five) minutes as needed for chest pain. 08/19/20   Minna Merritts, MD    Allergies Other  Family History  Problem Relation Age of Onset   CAD Father 66   Hypertension Father    Chronic Renal Failure Father    Congestive Heart Failure Sister    Kidney disease Sister    Cirrhosis  Maternal Grandfather    Alcohol abuse Maternal Grandfather    Colon cancer Paternal Grandfather    Congestive Heart Failure Paternal Grandmother     Social History Social History   Tobacco Use   Smoking status: Every Day    Packs/day: 1.00    Years: 34.00    Pack years: 34.00    Types: Cigarettes   Smokeless tobacco: Former    Types: Snuff    Quit date: 09/12/2015  Vaping Use   Vaping Use: Never used   Substance Use Topics   Alcohol use: Never   Drug use: Never    Review of Systems  Constitutional: Negative for fever. Eyes: Negative for visual changes. ENT: Negative for facial injury or neck injury Cardiovascular: Negative for chest injury. Respiratory: Negative for shortness of breath. Negative for chest wall injury. Gastrointestinal: Negative for abdominal pain or injury. Genitourinary: Negative for dysuria. Musculoskeletal: Negative for back injury, + R hip and R shoulder pain Skin: Negative for laceration/abrasions. Neurological: Negative for head injury.   ____________________________________________   PHYSICAL EXAM:  VITAL SIGNS: ED Triage Vitals  Enc Vitals Group     BP --      Pulse --      Resp --      Temp 01/06/21 0505 (!) 97.2 F (36.2 C)     Temp Source 01/06/21 0505 Oral     SpO2 --      Weight 01/06/21 0506 172 lb 14.4 oz (78.4 kg)     Height 01/06/21 0506 '6\' 1"'$  (1.854 m)     Head Circumference --      Peak Flow --      Pain Score 01/06/21 0505 9     Pain Loc --      Pain Edu? --      Excl. in [place]? --     Full spinal precautions maintained throughout the trauma exam. Constitutional: Alert and oriented. Actively vomiting HEENT Head: Normocephalic and atraumatic. Face: No facial bony tenderness. Stable midface Ears: No hemotympanum bilaterally. No Battle sign Eyes: No eye injury. PERRL. No raccoon eyes Nose: Nontender. No epistaxis. No rhinorrhea Mouth/Throat: Mucous membranes are moist. No oropharyngeal blood. No dental injury. Airway patent without stridor. Normal voice. Neck: no C-collar. No midline c-spine tenderness.  Cardiovascular: Normal rate, regular rhythm. Normal and symmetric distal pulses are present in all extremities. Pulmonary/Chest: Chest wall is stable and nontender to palpation/compression. Normal respiratory effort. Breath sounds are normal. No crepitus.  Abdominal: Soft, nontender, non distended. Musculoskeletal: Tender to  palpation on the lateral aspect of the proximal right femur with no obvious deformities.  Tender to palpation on the proximal right humerus with no obvious deformities.  Nontender with normal full range of motion in all other extremities. No deformities. No thoracic or lumbar midline spinal tenderness. Pelvis is stable. Skin: Skin is warm, dry and intact. No abrasions or contutions. Psychiatric: Speech and behavior are appropriate. Neurological: Normal speech and language. Moves all extremities to command. No gross focal neurologic deficits are appreciated.  Glascow Coma Score: 4 - Opens eyes on own 6 - Follows simple motor commands 5 - Alert and oriented GCS: 15   ____________________________________________   LABS (all labs ordered are listed, but only abnormal results are displayed)  Labs Reviewed  RESP PANEL BY RT-PCR (FLU A&B, COVID) ARPGX2  CBC WITH DIFFERENTIAL/PLATELET  BASIC METABOLIC PANEL  PROTIME-INR  APTT  TYPE AND SCREEN   ____________________________________________  EKG  PND  ____________________________________________  RADIOLOGY  I have  personally reviewed the images performed during this visit and I agree with the Radiologist's read.   Interpretation by Radiologist:  DG Shoulder Right  Result Date: 01/06/2021 CLINICAL DATA:  [place]. Right shoulder pain. EXAM: RIGHT SHOULDER - 2+ VIEW COMPARISON:  None. FINDINGS: Mild glenohumeral joint degenerative changes but no fracture or dislocation. The visualized right ribs are intact and the visualized right lung is clear. IMPRESSION: No fracture or dislocation. Electronically Signed   By: Marijo Sanes M.D.   On: 01/06/2021 05:48   CT PELVIS WO CONTRAST  Result Date: 01/06/2021 CLINICAL DATA:  [place]. Abnormal radiographs. EXAM: CT PELVIS WITHOUT CONTRAST TECHNIQUE: Multidetector CT imaging of the pelvis was performed following the standard protocol without intravenous contrast. COMPARISON:  Radiographs, same date.  FINDINGS: There is a mildly impacted basicervical neck fracture of the right hip. Both hips are normally located. Moderate degenerative changes bilaterally. No evidence of AVN. The pubic symphysis and SI joints are intact. No pelvic fractures or bone lesions. Incidental partial sacralization of L5. Grossly by CT hip and pelvic musculature are unremarkable. No obvious muscle tear or intramuscular hematoma. No significant intrapelvic abnormalities are identified. There are advanced atherosclerotic calcifications involving the aorta and iliac arteries and branch vessels. IMPRESSION: 1. Mildly impacted basicervical neck fracture of the right hip. 2. Moderate degenerative changes bilaterally. 3. No pelvic fractures or bone lesions. 4. Advanced atherosclerotic calcifications involving the aorta and iliac arteries and branch vessels. Aortic Atherosclerosis (ICD10-I70.0). Electronically Signed   By: Marijo Sanes M.D.   On: 01/06/2021 06:22   DG Hip Unilat W or Wo Pelvis 2-3 Views Right  Result Date: 01/06/2021 CLINICAL DATA:  [place]. Right hip pain. EXAM: DG HIP (WITH OR WITHOUT PELVIS) 2-3V RIGHT COMPARISON:  None. FINDINGS: Both hips are normally located. Mild degenerative changes. Suspect a right femoral neck fracture with slight impaction/shortening. CT may be helpful for further evaluation. The pubic symphysis and SI joints are intact. No definite pelvic fractures. Significant age advanced vascular calcifications. IMPRESSION: Suspect right femoral neck fracture. CT may be helpful for further evaluation. Electronically Signed   By: Marijo Sanes M.D.   On: 01/06/2021 05:51     ____________________________________________   PROCEDURES  Procedure(s) performed: None Procedures Critical Care performed:  None ____________________________________________   INITIAL IMPRESSION / ASSESSMENT AND PLAN / ED COURSE  60 y.o. male who presents for evaluation after mechanical fall with right hip and right shoulder  pain.  Patient arrives actively vomiting after receiving fentanyl.  Patient is status post 8 mg of Zofran per EMS.  Will give Phenergan and morphine for pain.  Patient complain of severe right hip pain and mild right shoulder pain.  No obvious deformities on exam.  Imaging is pending.  _________________________ 6:39 AM on 01/06/2021 ----------------------------------------- Imaging confirms right hip fracture.  Discussed with Dr. Rudene Christians who will plan on operative today.  We will consult hospitalist for admission. Preop labs, COVID and EKG pending.       ____________________________________________  Please note:  Patient was evaluated in Emergency Department today for the symptoms described in the history of present illness. Patient was evaluated in the context of the global COVID-19 pandemic, which necessitated consideration that the patient might be at risk for infection with the SARS-CoV-2 virus that causes COVID-19. Institutional protocols and algorithms that pertain to the evaluation of patients at risk for COVID-19 are in a state of rapid change based on information released by regulatory bodies including the CDC and federal and state organizations. These policies  and algorithms were followed during the patient's care in the ED.  Some ED evaluations and interventions may be delayed as a result of limited staffing during the pandemic.   ____________________________________________   FINAL CLINICAL IMPRESSION(S) / ED DIAGNOSES   Final diagnoses:  Fall, initial encounter  Closed fracture of right hip, initial encounter ([place])      NEW MEDICATIONS STARTED DURING THIS VISIT:  ED Discharge Orders     None        Note:  This document was prepared using Dragon voice recognition software and may include unintentional dictation errors.    Rudene Re, MD 01/06/21 [phone]

## 2021-01-06 NOTE — Transfer of Care (Signed)
Immediate Anesthesia Transfer of Care Note  Patient: Geoffrey Rangel  Procedure(s) Performed: TOTAL HIP ARTHROPLASTY ANTERIOR APPROACH (Right: Hip)  Patient Location: PACU  Anesthesia Type:General  Level of Consciousness: sedated  Airway & Oxygen Therapy: Patient Spontanous Breathing and Patient connected to face mask oxygen  Post-op Assessment: Report given to RN and Post -op Vital signs reviewed and stable  Post vital signs: Reviewed and stable  Last Vitals:  Vitals Value Taken Time  BP 142/59 01/06/21 1901  Temp 36.2 C 01/06/21 1901  Pulse 85 01/06/21 1902  Resp 15 01/06/21 1902  SpO2 100 % 01/06/21 1902  Vitals shown include unvalidated device data.  Last Pain:  Vitals:   01/06/21 0940  TempSrc:   PainSc: 9          Complications: No notable events documented.

## 2021-01-06 NOTE — ED Notes (Signed)
Pt declined antinausea medication at this time for transport to IP bed because had previously had motion sickness. Would like to try transport without.

## 2021-01-06 NOTE — H&P (Signed)
History and Physical    Geoffrey Rangel [id number] DOB: 1960-08-14 DOA: 01/06/2021  Referring MD/NP/PA:   PCP: Patient, No Pcp Per (Inactive)   Patient coming from:  The patient is coming from home.        Chief Complaint: fall and right hip pain  HPI: Geoffrey Rangel is a 60 y.o. male with medical history significant of ESRD (pt was on dialysis, but not compliant to dialysis, stopped doing HD, last HD was in 01/2020, still making urine), HTN, HLD, GERD, tobacco abuse, anemia, BPH, who presents with fall and right hip pain.  Pt states that he fell accidentally when he was moving a TV and tripped on blanket in the early morning.  He injured his right hip, right shoulder and right arm.  He has severe pain in right hip and some pain in the right shoulder.  The pain in right hip is constant and sharp, severe, nonradiating.  No leg numbness.  Patient denies any chest pain.  He states that he has chronic mild shortness of breath and mild dry cough which has not changed recently.  Denies fever or chills.  Patient states he had nausea and vomited twice, which has resolved.  Currently patient does not have any nausea vomiting, diarrhea or abdominal pain.  No symptoms of UTI.  Patient strongly denies any head or neck injury.  He refused CT scan of head and neck.  He states that he stopped doing dialysis in November 2021.  He still makes urine, no symptoms of UTI.  He has AV fistula placed to the right arm, but he states that he does not want to dialysis again.  Patient does not have unilateral numbness or tingling in extremities.  No facial droop or slurred speech.  No confusion.  Patient is alert, oriented x3.  Of note, chart review showed that pt was hospitalized  due to chest pain March 2020.  Due to the advanced CKD, no cardiac cath was done.  Patient had NM stress test which was negative for ischemic change.  Patient also had 2D echo on 06/09/18 which showed EF of 60 to 65%. Troponin was negative at that  time.  ED Course: pt was found to have WBC 14.1, pending COVID-19 PCR, potassium of 4.4, bicarbonate of 12, creatinine 10.51, BUN 106, temperature 97.2, blood pressure 172/77, heart rate 75, RR 12, oxygen saturation 99% room air.  Chest x-ray negative.  X-ray of right shoulder negative.  X-ray of right hip/pelvis that showed right femoral neck fracture which is confirmed with CT scan of pelvis.  Patient is admitted to [place] bed as inpatient.  Dr. Rudene Christians of Ortho is consulted.  Review of Systems:   General: no fevers, chills, no body weight gain, has fatigue HEENT: no blurry vision, hearing changes or sore throat Respiratory: has dyspnea, coughing, no wheezing CV: no chest pain, no palpitations GI: currently no nausea, vomiting, abdominal pain, diarrhea, constipation GU: no dysuria, burning on urination, increased urinary frequency, hematuria  Ext: no leg edema Neuro: no unilateral weakness, numbness, or tingling, no vision change or hearing loss. Has fall. Skin: no rash, no skin tear. MSK: has pain in right hip and right shoulder Heme: No easy bruising.  Travel history: No recent long distant travel.  Allergy:  Allergies  Allergen Reactions   Other     Past Medical History:  Diagnosis Date   Arthritis    Chronic kidney disease    Dyspnea    occasional  GERD (gastroesophageal reflux disease)    Hyperlipidemia    Hypertension    Renal disorder     Past Surgical History:  Procedure Laterality Date   AV FISTULA PLACEMENT Right 09/13/2018   Procedure: ARTERIOVENOUS (AV) FISTULA CREATION ( BRACHIAL CEPHALIC );  Surgeon: Algernon Huxley, MD;  Location: [facility];  Service: Vascular;  Laterality: Right;   DIALYSIS/PERMA CATHETER INSERTION N/A 06/19/2018   Procedure: DIALYSIS/PERMA CATHETER INSERTION;  Surgeon: Algernon Huxley, MD;  Location: [place] [facility];  Service: Cardiovascular;  Laterality: N/A;   DIALYSIS/PERMA CATHETER INSERTION N/A 07/19/2018   Procedure: DIALYSIS/PERMA  CATHETER INSERTION;  Surgeon: Katha Cabal, MD;  Location: [place] [facility];  Service: Cardiovascular;  Laterality: N/A;   DIALYSIS/PERMA CATHETER INSERTION N/A 09/06/2018   Procedure: DIALYSIS/PERMA CATHETER INSERTION;  Surgeon: Katha Cabal, MD;  Location: [place] [facility];  Service: Cardiovascular;  Laterality: N/A;   DIALYSIS/PERMA CATHETER REMOVAL N/A 12/06/2018   Procedure: DIALYSIS/PERMA CATHETER REMOVAL;  Surgeon: Katha Cabal, MD;  Location: [place] [facility];  Service: Cardiovascular;  Laterality: N/A;   RENAL BIOPSY, PERCUTANEOUS  09/17/2016        Social History:  reports that he has been smoking. He has a 34.00 pack-year smoking history. He quit smokeless tobacco use about 5 years ago.  His smokeless tobacco use included snuff. He reports that he does not drink alcohol and does not use drugs.  Family History:  Family History  Problem Relation Age of Onset   CAD Father 27   Hypertension Father    Chronic Renal Failure Father    Congestive Heart Failure Sister    Kidney disease Sister    Cirrhosis Maternal Grandfather    Alcohol abuse Maternal Grandfather    Colon cancer Paternal Grandfather    Congestive Heart Failure Paternal Grandmother      Prior to Admission medications   Medication Sig Start Date End Date Taking? Authorizing Provider  amLODipine (NORVASC) 2.5 MG tablet Take 2.5 mg by mouth daily. 05/14/20   [provider]  haloperidol (HALDOL) 0.5 MG tablet Take 0.5 mg by mouth every 6 (six) hours as needed. 03/20/20   [provider]  isosorbide mononitrate (IMDUR) 30 MG 24 hr tablet Take 1 tablet (30 mg total) by mouth daily. 12/25/19   Minna Merritts, MD  nitroGLYCERIN (NITROSTAT) 0.4 MG SL tablet Place 1 tablet (0.4 mg total) under the tongue every 5 (five) minutes as needed for chest pain. 08/19/20   Minna Merritts, MD    Physical Exam: Vitals:   01/06/21 0600 01/06/21 0630 01/06/21 0700 01/06/21 0730  BP: (!)  161/82 (!) 173/73 (!) 172/77 (!) 167/78  Pulse: 73 73 75 78  Resp: '16 13 12 17  '$ Temp:      TempSrc:      SpO2: 97% 99% 99% 96%  Weight:      Height:       General: Not in acute distress HEENT:       Eyes: PERRL, EOMI, no scleral icterus.       ENT: No discharge from the ears and nose, no pharynx injection, no tonsillar enlargement.        Neck: No JVD, no bruit, no mass felt. Heme: No neck lymph node enlargement. Cardiac: S1/S2, RRR, No murmurs, No gallops or rubs. Respiratory: No rales, wheezing, rhonchi or rubs. GI: Soft, nondistended, nontender, no rebound pain, no organomegaly, BS present. GU: No hematuria Ext: No pitting leg edema bilaterally. 1+DP/PT  pulse bilaterally. Musculoskeletal: has tenderness in right hip Skin: No rashes.  Neuro: Alert, oriented X3, cranial nerves II-XII grossly intact, moves all extremities  Psych: Patient is not psychotic, no suicidal or hemocidal ideation.  Labs on Admission: I have personally reviewed following labs and imaging studies  CBC: Recent Labs  Lab 01/06/21 0646  WBC 14.1*  NEUTROABS 12.4*  HGB 8.5*  HCT 26.2*  MCV 91.3  PLT [id number]   Basic Metabolic Panel: Recent Labs  Lab 01/06/21 0646  NA 139  K 4.4  CL 110  CO2 12*  GLUCOSE 107*  BUN 106*  CREATININE 10.51*  CALCIUM 8.2*   GFR: Estimated Creatinine Clearance: 8.5 mL/min (A) (by C-G formula based on SCr of 10.51 mg/dL (H)). Liver Function Tests: No results for input(s): AST, ALT, ALKPHOS, BILITOT, PROT, ALBUMIN in the last 168 hours. No results for input(s): LIPASE, AMYLASE in the last 168 hours. No results for input(s): AMMONIA in the last 168 hours. Coagulation Profile: Recent Labs  Lab 01/06/21 0646  INR 1.2   Cardiac Enzymes: No results for input(s): CKTOTAL, CKMB, CKMBINDEX, TROPONINI in the last 168 hours. BNP (last 3 results) No results for input(s): PROBNP in the last 8760 hours. HbA1C: No results for input(s): HGBA1C in the last 72  hours. CBG: No results for input(s): GLUCAP in the last 168 hours. Lipid Profile: No results for input(s): CHOL, HDL, LDLCALC, TRIG, CHOLHDL, LDLDIRECT in the last 72 hours. Thyroid Function Tests: No results for input(s): TSH, T4TOTAL, FREET4, T3FREE, THYROIDAB in the last 72 hours. Anemia Panel: No results for input(s): VITAMINB12, FOLATE, FERRITIN, TIBC, IRON, RETICCTPCT in the last 72 hours. Urine analysis:    Component Value Date/Time   COLORURINE STRAW (A) 06/08/2018 1406   APPEARANCEUR CLEAR 06/08/2018 1406   LABSPEC 1.010 06/08/2018 1406   PHURINE 6.0 06/08/2018 1406   GLUCOSEU NEGATIVE 06/08/2018 1406   HGBUR MODERATE (A) 06/08/2018 1406   BILIRUBINUR NEGATIVE 06/08/2018 1406   KETONESUR NEGATIVE 06/08/2018 1406   PROTEINUR 100 (A) 06/08/2018 1406   NITRITE NEGATIVE 06/08/2018 1406   LEUKOCYTESUR NEGATIVE 06/08/2018 1406   Sepsis Labs: '@LABRCNTIP'$ (procalcitonin:4,lacticidven:4) )No results found for this or any previous visit (from the past 240 hour(s)).   Radiological Exams on Admission: DG Shoulder Right  Result Date: 01/06/2021 CLINICAL DATA:  [place]. Right shoulder pain. EXAM: RIGHT SHOULDER - 2+ VIEW COMPARISON:  None. FINDINGS: Mild glenohumeral joint degenerative changes but no fracture or dislocation. The visualized right ribs are intact and the visualized right lung is clear. IMPRESSION: No fracture or dislocation. Electronically Signed   By: Marijo Sanes M.D.   On: 01/06/2021 05:48   CT PELVIS WO CONTRAST  Result Date: 01/06/2021 CLINICAL DATA:  [place]. Abnormal radiographs. EXAM: CT PELVIS WITHOUT CONTRAST TECHNIQUE: Multidetector CT imaging of the pelvis was performed following the standard protocol without intravenous contrast. COMPARISON:  Radiographs, same date. FINDINGS: There is a mildly impacted basicervical neck fracture of the right hip. Both hips are normally located. Moderate degenerative changes bilaterally. No evidence of AVN. The pubic symphysis and  SI joints are intact. No pelvic fractures or bone lesions. Incidental partial sacralization of L5. Grossly by CT hip and pelvic musculature are unremarkable. No obvious muscle tear or intramuscular hematoma. No significant intrapelvic abnormalities are identified. There are advanced atherosclerotic calcifications involving the aorta and iliac arteries and branch vessels. IMPRESSION: 1. Mildly impacted basicervical neck fracture of the right hip. 2. Moderate degenerative changes bilaterally. 3. No pelvic fractures or bone lesions. 4. Advanced  atherosclerotic calcifications involving the aorta and iliac arteries and branch vessels. Aortic Atherosclerosis (ICD10-I70.0). Electronically Signed   By: Marijo Sanes M.D.   On: 01/06/2021 06:22   DG Chest Portable 1 View  Result Date: 01/06/2021 CLINICAL DATA:  Preoperative chest x-ray. Right hip fracture. EXAM: PORTABLE CHEST 1 VIEW COMPARISON:  06/08/2018 FINDINGS: The cardiac silhouette, mediastinal and hilar contours are within normal limits. There is mild tortuosity and calcification of the thoracic aorta. The lungs are clear of an acute process. No pulmonary lesions or pleural effusions. The bony thorax is intact. IMPRESSION: No acute cardiopulmonary findings. Electronically Signed   By: Marijo Sanes M.D.   On: 01/06/2021 06:46   DG Hip Unilat W or Wo Pelvis 2-3 Views Right  Result Date: 01/06/2021 CLINICAL DATA:  [place]. Right hip pain. EXAM: DG HIP (WITH OR WITHOUT PELVIS) 2-3V RIGHT COMPARISON:  None. FINDINGS: Both hips are normally located. Mild degenerative changes. Suspect a right femoral neck fracture with slight impaction/shortening. CT may be helpful for further evaluation. The pubic symphysis and SI joints are intact. No definite pelvic fractures. Significant age advanced vascular calcifications. IMPRESSION: Suspect right femoral neck fracture. CT may be helpful for further evaluation. Electronically Signed   By: Marijo Sanes M.D.   On: 01/06/2021  05:51     EKG: I have personally reviewed.  Sinus rhythm, QTC 485   Assessment/Plan Principal Problem:   Closed right hip fracture (HCC) Active Problems:   Hyperlipidemia   End stage renal disease ([place])   Fall at home, initial encounter   Hypertension   Tobacco abuse   Anemia in ESRD (end-stage renal disease) ([place])   Leukocytosis   Closed right hip fracture ([place]): X-ray of right hip/pelvis that showed right femoral neck fracture which is confirmed with CT scan of pelvis. No neurovascular compromise. Orthopedic surgeon, Dr. Rudene Christians was consulted, planning to do surgery today.  - will admit to Med-surg bed - Pain control: morphine prn and percocet - When necessary Zofran for nausea - Robaxin for muscle spasm - Appreciated Dr. consultation - type and cross - INR/PTT -PT/OT when able to (not ordered now)  Leukocytosis: WBC 14.1. Likely due to stress-induced demargination. Patient does not have signs of infection. -Follow-up CBC  Hyperlipidemia:  -Crestor  End stage renal disease ([place]): pt was on dialysis, but not compliant to dialysis, stopped doing HD, last HD was in 01/2020, still making urine. Potassium of 4.4, bicarbonate of 12, creatinine 10.51, BUN 106. -Consulted Dr. Candiss Norse of nephrology  Fall at home, initial encounter: -pt/ot when able to  Hypertension -IV hydralazine as needed -Continue home amlodipine and imdur  Tobacco abuse -Nicotine patch  Anemia in ESRD (end-stage renal disease) ([place]): Hemoglobin 8.5, 13.6 on 11/29/2019 -Check anemia panel       Perioperative Cardiac Risk: pt has multiple comorbidities, as listed above.   Currently patient is active and independent of ADLs and, IADLs. Chart review showed that pt was hospitalized  due to chest pain March 2020.  Due to the advanced CKD, no cardiac cath was done.  Patient had NM stress test which was negative for ischemic change.  Patient also had 2D echo on 06/09/18 which showed EF of 60 to 65%. Troponin  was negative at that time. No recent acute cardiac issues.  Patient has chronic mild shortness breath and mild dry cough, which has not changed recently.  Patient is euvolemic on physical examination.  No oxygen desaturation.  No active chest pain or leg edema.  No  signs of fluid overload. At this time point, no further work up is needed. Patient's GUPTA score perioperative myocardial infarction or cardaic arrest is 0.89% and Revised Cardiac Risk Index [place] Criteria) is 0.9 %. I discussed the risk with patient, pt would like to proceed for surgery.    DVT ppx: SCD Code Status: DNR (I discussed with patient and explained the meaning of CODE STATUS. Patient wants to be DNR). This is confirmed by his wife. Family Communication: Yes, patient's  wife by phone Disposition Plan:  Anticipate discharge back to previous environment Consults called:  Dr. Rudene Christians Admission status and Level of care: Med-Surg:     as inpt       Status is: Inpatient  Remains inpatient appropriate because:Inpatient level of care appropriate due to severity of illness  Dispo: The patient is from: Home              Anticipated d/c is to:  to be determined              Patient currently is not medically stable to d/c.   Difficult to place patient No          Date of Service 01/06/2021    [facility]   If 7PM-7AM, please contact night-coverage [email or web address] 01/06/2021, 7:50 AM

## 2021-01-06 NOTE — Anesthesia Postprocedure Evaluation (Signed)
Anesthesia Post Note  Patient: Geoffrey Rangel  Procedure(s) Performed: TOTAL HIP ARTHROPLASTY ANTERIOR APPROACH (Right: Hip)  Patient location during evaluation: PACU Anesthesia Type: General Level of consciousness: awake and alert Pain management: pain level controlled Vital Signs Assessment: post-procedure vital signs reviewed and stable Respiratory status: spontaneous breathing, nonlabored ventilation, respiratory function stable and patient connected to nasal cannula oxygen Cardiovascular status: blood pressure returned to baseline and stable Postop Assessment: no apparent nausea or vomiting Anesthetic complications: no   No notable events documented.   Last Vitals:  Vitals:   01/06/21 1907 01/06/21 1930  BP:  126/60  Pulse:  78  Resp:  16  Temp:  (!) 36.1 C  SpO2: 100% 99%    Last Pain:  Vitals:   01/06/21 1938  TempSrc:   PainSc: 6                  Precious Haws Audree Schrecengost

## 2021-01-06 NOTE — Progress Notes (Addendum)
[facility]  ROUNDING NOTE   Subjective:   Geoffrey Rangel is a 60 y.o. male with past medical history of hypertension, hyperlipidemia, GERD, anemia, BPH, and end stage renal disease stopped dialysis Nov 2021. Patient presents to ED after fall and right hip pain.   Patient is known to our clinic and received dialysis at [facility], supervised by Dr Candiss Norse. He decided to stop dialysis last year stating he was tired of getting stuck with needles. Patient says they were moving to another house and he tripped over a dog pillow. Denies shortness of breath, cough and fever. Denies nausea, vomiting and diarrhea. Labs on admission show a creatinine of 10.5, BUN 106, and potassium 4.4. X-ray of right hip/pelvis shows a femoral neck fracture, confirmed on CT scan of pelvis.   Objective:  Vital signs in last 24 hours:  Temp:  [97.2 F (36.2 C)-98.2 F (36.8 C)] 97.7 F (36.5 C) (10/11 1144) Pulse Rate:  [66-78] 72 (10/11 1144) Resp:  [12-23] 17 (10/11 1144) BP: (144-175)/(69-82) 144/77 (10/11 1144) SpO2:  [95 %-100 %] 99 % (10/11 1144) Weight:  [78.4 kg] 78.4 kg (10/11 0506)  Weight change:  Filed Weights   01/06/21 0506  Weight: 78.4 kg    Intake/Output: No intake/output data recorded.   Intake/Output this shift:  Total I/O In: -  Out: 900 [Urine:900]  Physical Exam: General: NAD, laying in bed  Head: Normocephalic, atraumatic. Moist oral mucosal membranes  Eyes: Anicteric  Lungs:  Clear to auscultation, normal effort  Heart: Regular rate and rhythm  Abdomen:  Soft, nontender  Extremities:  no peripheral edema.  Neurologic: Nonfocal, moving all four extremities  Skin: No lesions  Access: Rt AVF    Basic Metabolic Panel: Recent Labs  Lab 01/06/21 0646  NA 139  K 4.4  CL 110  CO2 12*  GLUCOSE 107*  BUN 106*  CREATININE 10.51*  CALCIUM 8.2*  PHOS 9.7*    Liver Function Tests: No results for input(s): AST, ALT, ALKPHOS, BILITOT, PROT, ALBUMIN in the  last 168 hours. No results for input(s): LIPASE, AMYLASE in the last 168 hours. No results for input(s): AMMONIA in the last 168 hours.  CBC: Recent Labs  Lab 01/06/21 0646  WBC 14.1*  NEUTROABS 12.4*  HGB 8.5*  HCT 26.2*  MCV 91.3  PLT 244    Cardiac Enzymes: No results for input(s): CKTOTAL, CKMB, CKMBINDEX, TROPONINI in the last 168 hours.  BNP: Invalid input(s): POCBNP  CBG: No results for input(s): GLUCAP in the last 168 hours.  Microbiology: Results for orders placed or performed during the hospital encounter of 01/06/21  Resp Panel by RT-PCR (Flu A&B, Covid) Nasopharyngeal Swab     Status: None   Collection Time: 01/06/21  6:46 AM   Specimen: Nasopharyngeal Swab; Nasopharyngeal(NP) swabs in vial transport medium  Result Value Ref Range Status   SARS Coronavirus 2 by RT PCR NEGATIVE NEGATIVE Final    Comment: (NOTE) SARS-CoV-2 target nucleic acids are NOT DETECTED.  The SARS-CoV-2 RNA is generally detectable in upper respiratory specimens during the acute phase of infection. The lowest concentration of SARS-CoV-2 viral copies this assay can detect is 138 copies/mL. A negative result does not preclude SARS-Cov-2 infection and should not be used as the sole basis for treatment or other patient management decisions. A negative result may occur with  improper specimen collection/handling, submission of specimen other than nasopharyngeal swab, presence of viral mutation(s) within the areas targeted by this assay, and inadequate number  of viral copies(<138 copies/mL). A negative result must be combined with clinical observations, patient history, and epidemiological information. The expected result is Negative.  Fact Sheet for Patients:  EntrepreneurPulse.com.au  Fact Sheet for Healthcare Providers:  IncredibleEmployment.be  This test is no t yet approved or cleared by the [place] FDA and  has been authorized for  detection and/or diagnosis of SARS-CoV-2 by FDA under an Emergency Use Authorization (EUA). This EUA will remain  in effect (meaning this test can be used) for the duration of the COVID-19 declaration under Section 564(b)(1) of the Act, 21 U.S.C.section 360bbb-3(b)(1), unless the authorization is terminated  or revoked sooner.       Influenza A by PCR NEGATIVE NEGATIVE Final   Influenza B by PCR NEGATIVE NEGATIVE Final    Comment: (NOTE) The Xpert Xpress SARS-CoV-2/FLU/RSV plus assay is intended as an aid in the diagnosis of influenza from Nasopharyngeal swab specimens and should not be used as a sole basis for treatment. Nasal washings and aspirates are unacceptable for Xpert Xpress SARS-CoV-2/FLU/RSV testing.  Fact Sheet for Patients: EntrepreneurPulse.com.au  Fact Sheet for Healthcare Providers: IncredibleEmployment.be  This test is not yet approved or cleared by the [place] FDA and has been authorized for detection and/or diagnosis of SARS-CoV-2 by FDA under an Emergency Use Authorization (EUA). This EUA will remain in effect (meaning this test can be used) for the duration of the COVID-19 declaration under Section 564(b)(1) of the Act, 21 U.S.C. section 360bbb-3(b)(1), unless the authorization is terminated or revoked.  Performed at [facility], [address] [place], [place] [address]     Coagulation Studies: Recent Labs    01/06/21 0646  LABPROT 14.7  INR 1.2    Urinalysis: No results for input(s): COLORURINE, LABSPEC, PHURINE, GLUCOSEU, HGBUR, BILIRUBINUR, KETONESUR, PROTEINUR, UROBILINOGEN, NITRITE, LEUKOCYTESUR in the last 72 hours.  Invalid input(s): APPERANCEUR    Imaging: DG Shoulder Right  Result Date: 01/06/2021 CLINICAL DATA:  [place]. Right shoulder pain. EXAM: RIGHT SHOULDER - 2+ VIEW COMPARISON:  None. FINDINGS: Mild glenohumeral joint degenerative changes but no fracture or dislocation. The  visualized right ribs are intact and the visualized right lung is clear. IMPRESSION: No fracture or dislocation. Electronically Signed   By: Marijo Sanes M.D.   On: 01/06/2021 05:48   CT PELVIS WO CONTRAST  Result Date: 01/06/2021 CLINICAL DATA:  [place]. Abnormal radiographs. EXAM: CT PELVIS WITHOUT CONTRAST TECHNIQUE: Multidetector CT imaging of the pelvis was performed following the standard protocol without intravenous contrast. COMPARISON:  Radiographs, same date. FINDINGS: There is a mildly impacted basicervical neck fracture of the right hip. Both hips are normally located. Moderate degenerative changes bilaterally. No evidence of AVN. The pubic symphysis and SI joints are intact. No pelvic fractures or bone lesions. Incidental partial sacralization of L5. Grossly by CT hip and pelvic musculature are unremarkable. No obvious muscle tear or intramuscular hematoma. No significant intrapelvic abnormalities are identified. There are advanced atherosclerotic calcifications involving the aorta and iliac arteries and branch vessels. IMPRESSION: 1. Mildly impacted basicervical neck fracture of the right hip. 2. Moderate degenerative changes bilaterally. 3. No pelvic fractures or bone lesions. 4. Advanced atherosclerotic calcifications involving the aorta and iliac arteries and branch vessels. Aortic Atherosclerosis (ICD10-I70.0). Electronically Signed   By: Marijo Sanes M.D.   On: 01/06/2021 06:22   DG Chest Portable 1 View  Result Date: 01/06/2021 CLINICAL DATA:  Preoperative chest x-ray. Right hip fracture. EXAM: PORTABLE CHEST 1 VIEW COMPARISON:  06/08/2018 FINDINGS: The cardiac silhouette,  mediastinal and hilar contours are within normal limits. There is mild tortuosity and calcification of the thoracic aorta. The lungs are clear of an acute process. No pulmonary lesions or pleural effusions. The bony thorax is intact. IMPRESSION: No acute cardiopulmonary findings. Electronically Signed   By: Marijo Sanes M.D.   On: 01/06/2021 06:46   DG Hip Unilat W or Wo Pelvis 2-3 Views Right  Result Date: 01/06/2021 CLINICAL DATA:  [place]. Right hip pain. EXAM: DG HIP (WITH OR WITHOUT PELVIS) 2-3V RIGHT COMPARISON:  None. FINDINGS: Both hips are normally located. Mild degenerative changes. Suspect a right femoral neck fracture with slight impaction/shortening. CT may be helpful for further evaluation. The pubic symphysis and SI joints are intact. No definite pelvic fractures. Significant age advanced vascular calcifications. IMPRESSION: Suspect right femoral neck fracture. CT may be helpful for further evaluation. Electronically Signed   By: Marijo Sanes M.D.   On: 01/06/2021 05:51     Medications:    sodium chloride 50 mL/hr at 01/06/21 0748    ceFAZolin (ANCEF) IV     promethazine (PHENERGAN) injection (IM or IVPB)      [START ON 01/07/2021] amLODipine  2.5 mg Oral Daily   gabapentin  100 mg Oral TID   [START ON 01/07/2021] isosorbide mononitrate  30 mg Oral Daily   nicotine  21 mg Transdermal Daily   rosuvastatin  5 mg Oral Daily   tamsulosin  0.4 mg Oral Daily   acetaminophen, albuterol, dextromethorphan-guaiFENesin, hydrALAZINE, methocarbamol, morphine injection, nitroGLYCERIN, ondansetron (ZOFRAN) IV, oxyCODONE-acetaminophen, promethazine (PHENERGAN) injection (IM or IVPB), senna-docusate  Assessment/ Plan:  Mr. JELAN BOWSHER is a 61 y.o.  male with past medical history of hypertension, hyperlipidemia, GERD, anemia, BPH, and end stage renal disease stopped dialysis Nov 2021. Patient has been admitted for Closed right hip fracture ([place]) [S72.001A] Fall, initial encounter [W19.XXXA] Closed fracture of right hip, initial encounter ([place]) [S72.001A]  End stage renal disease: Stopped dialysis in Nov 2021. States he does not want to start dialysis. Discussed peritoneal dialysis with patient and he states he may be interested in this. Will have home dialysis educator contact patient with more  information. Willing to undergo temporary dialysis after hip surgery to improve anesthesia clearance, if needed. Will continue to monitor  Anemia of chronic kidney disease Lab Results  Component Value Date   HGB 8.5 (L) 01/06/2021  Hgb below target, will monitor and consider SubQ EPO  3.Secondary Hyperparathyroidism:  Lab Results  Component Value Date   PTH 227 (H) 06/08/2018   CALCIUM 8.2 (L) 01/06/2021   PHOS 9.7 (H) 01/06/2021    Calcium not at goal and Phos elevated at 9.7  4. Hypertension with chronic renal disease Currently on Amlodipine and isosorbide monomitrate.  5. Closed right hip fracture Orthopedics following, planning surgery for right hip repair   LOS: 0 Sunny Aguon 10/11/20221:50 PM

## 2021-01-06 NOTE — Anesthesia Preprocedure Evaluation (Addendum)
Anesthesia Evaluation  Patient identified by MRN, date of birth, ID band Patient awake    Reviewed: Allergy & Precautions, NPO status , Patient's Chart, lab work & pertinent test results  History of Anesthesia Complications Negative for: history of anesthetic complications  Airway Mallampati: III  TM Distance: >3 FB Neck ROM: full    Dental  (+) Chipped, Poor Dentition, Missing   Pulmonary shortness of breath and with exertion, COPD,  COPD inhaler and oxygen dependent, Current Smoker and Patient abstained from smoking.,    Pulmonary exam normal        Cardiovascular hypertension, (-) angina+ Peripheral Vascular Disease  (-) Past MI Normal cardiovascular exam     Neuro/Psych negative neurological ROS  negative psych ROS   GI/Hepatic Neg liver ROS, GERD  Medicated and Controlled,  Endo/Other  negative endocrine ROS  Renal/GU ESRF and CRFRenal disease     Musculoskeletal  (+) Arthritis ,   Abdominal   Peds  Hematology  (+) Blood dyscrasia, ,   Anesthesia Other Findings Past Medical History: No date: Arthritis No date: Chronic kidney disease No date: Dyspnea     Comment:  occasional No date: GERD (gastroesophageal reflux disease) No date: Hyperlipidemia No date: Hypertension No date: Renal disorder  Past Surgical History: 09/13/2018: AV FISTULA PLACEMENT; Right     Comment:  Procedure: ARTERIOVENOUS (AV) FISTULA CREATION (               BRACHIAL CEPHALIC );  Surgeon: Algernon Huxley, MD;                Location: [facility];  Service: Vascular;  Laterality:               Right; 06/19/2018: DIALYSIS/PERMA CATHETER INSERTION; N/A     Comment:  Procedure: DIALYSIS/PERMA CATHETER INSERTION;  Surgeon:               Algernon Huxley, MD;  Location: [place] [facility];                Service: Cardiovascular;  Laterality: N/A; 07/19/2018: DIALYSIS/PERMA CATHETER INSERTION; N/A     Comment:  Procedure: DIALYSIS/PERMA CATHETER  INSERTION;  Surgeon:               Katha Cabal, MD;  Location: [place] [facility];               Service: Cardiovascular;  Laterality: N/A; 09/06/2018: DIALYSIS/PERMA CATHETER INSERTION; N/A     Comment:  Procedure: DIALYSIS/PERMA CATHETER INSERTION;  Surgeon:               Katha Cabal, MD;  Location: [place] [facility];               Service: Cardiovascular;  Laterality: N/A; 12/06/2018: DIALYSIS/PERMA CATHETER REMOVAL; N/A     Comment:  Procedure: DIALYSIS/PERMA CATHETER REMOVAL;  Surgeon:               Katha Cabal, MD;  Location: [place] [facility];               Service: Cardiovascular;  Laterality: N/A; 09/17/2016: RENAL BIOPSY, PERCUTANEOUS     Comment:     BMI    Body Mass Index: 22.81 kg/m      Reproductive/Obstetrics negative OB ROS                            Anesthesia Physical Anesthesia Plan  ASA: 4  Anesthesia Plan: General ETT   Post-op Pain Management:    Induction: Intravenous  PONV Risk Score and Plan: Ondansetron, Dexamethasone, Midazolam and Treatment may vary due to age or medical condition  Airway Management Planned: Oral ETT  Additional Equipment:   Intra-op Plan:   Post-operative Plan: Extubation in OR and Possible Post-op intubation/ventilation  Informed Consent: I have reviewed the patients History and Physical, chart, labs and discussed the procedure including the risks, benefits and alternatives for the proposed anesthesia with the patient or authorized representative who has indicated his/her understanding and acceptance.   Patient has DNR.  Discussed DNR with power of attorney and Suspend DNR.   Dental Advisory Given  Plan Discussed with: Anesthesiologist, CRNA and Surgeon  Anesthesia Plan Comments: (Patient is covered in bruises and endorses a strong bleed history so plan for GA  Patient consented for risks of anesthesia including but not limited to:  - adverse reactions to  medications - damage to eyes, teeth, lips or other oral mucosa - nerve damage due to positioning  - sore throat or hoarseness - Damage to heart, brain, nerves, lungs, other parts of body or loss of life  Patient voiced understanding.)       Anesthesia Quick Evaluation

## 2021-01-06 NOTE — Anesthesia Procedure Notes (Signed)

## 2021-01-06 NOTE — ED Notes (Signed)
Pt reports nausea has improved. His pain has increased from 6 to 8 since Morphine at 5 am. Oral pain med orders waiting rx approval. Pt on phone with wife currently to discuss surgical options. No other needs at this time.

## 2021-01-06 NOTE — Consult Note (Addendum)
Reason for Consult: Right femoral neck fracture Referring Physician: Dr. Alfred Levins   HPI: Geoffrey Rangel is an 60 y.o. male.  Who suffered a fall with right femoral neck fracture..  He has end-stage renal disease and stopped dialysis 11 months ago.  He reports at that time they told him he would not live very long but continues to be active.  He is community ambulator without assistive device.  He has noted increasing weakness.   Past Medical History:  Diagnosis Date   Arthritis    Chronic kidney disease    Dyspnea    occasional   GERD (gastroesophageal reflux disease)    Hyperlipidemia    Hypertension    Renal disorder     Past Surgical History:  Procedure Laterality Date   AV FISTULA PLACEMENT Right 09/13/2018   Procedure: ARTERIOVENOUS (AV) FISTULA CREATION ( BRACHIAL CEPHALIC );  Surgeon: Algernon Huxley, MD;  Location: [facility];  Service: Vascular;  Laterality: Right;   DIALYSIS/PERMA CATHETER INSERTION N/A 06/19/2018   Procedure: DIALYSIS/PERMA CATHETER INSERTION;  Surgeon: Algernon Huxley, MD;  Location: [place] [facility];  Service: Cardiovascular;  Laterality: N/A;   DIALYSIS/PERMA CATHETER INSERTION N/A 07/19/2018   Procedure: DIALYSIS/PERMA CATHETER INSERTION;  Surgeon: Katha Cabal, MD;  Location: [place] [facility];  Service: Cardiovascular;  Laterality: N/A;   DIALYSIS/PERMA CATHETER INSERTION N/A 09/06/2018   Procedure: DIALYSIS/PERMA CATHETER INSERTION;  Surgeon: Katha Cabal, MD;  Location: [place] [facility];  Service: Cardiovascular;  Laterality: N/A;   DIALYSIS/PERMA CATHETER REMOVAL N/A 12/06/2018   Procedure: DIALYSIS/PERMA CATHETER REMOVAL;  Surgeon: Katha Cabal, MD;  Location: [place] [facility];  Service: Cardiovascular;  Laterality: N/A;   RENAL BIOPSY, PERCUTANEOUS  09/17/2016        Family History  Problem Relation Age of Onset   CAD Father 70   Hypertension Father    Chronic Renal Failure Father    Congestive Heart Failure Sister     Kidney disease Sister    Cirrhosis Maternal Grandfather    Alcohol abuse Maternal Grandfather    Colon cancer Paternal Grandfather    Congestive Heart Failure Paternal Grandmother     Social History:  reports that he has been smoking. He has a 34.00 pack-year smoking history. He quit smokeless tobacco use about 5 years ago.  His smokeless tobacco use included snuff. He reports that he does not drink alcohol and does not use drugs.  Allergies:  Allergies  Allergen Reactions   Other     Medications: I have reviewed the patient's current medications.  Results for orders placed or performed during the hospital encounter of 01/06/21 (from the past 48 hour(s))  CBC with Differential     Status: Abnormal   Collection Time: 01/06/21  6:46 AM  Result Value Ref Range   WBC 14.1 (H) 4.0 - 10.5 K/uL   RBC 2.87 (L) 4.22 - 5.81 MIL/uL   Hemoglobin 8.5 (L) 13.0 - 17.0 g/dL   HCT 26.2 (L) 39.0 - 52.0 %   MCV 91.3 80.0 - 100.0 fL   MCH 29.6 26.0 - 34.0 pg   MCHC 32.4 30.0 - 36.0 g/dL   RDW 14.4 11.5 - 15.5 %   Platelets 244 150 - 400 K/uL   nRBC 0.0 0.0 - 0.2 %   Neutrophils Relative % 88 %   Neutro Abs 12.4 (H) 1.7 - 7.7 K/uL   Lymphocytes Relative 6 %   Lymphs Abs 0.8 0.7 - 4.0 K/uL  Monocytes Relative 5 %   Monocytes Absolute 0.6 0.1 - 1.0 K/uL   Eosinophils Relative 0 %   Eosinophils Absolute 0.0 0.0 - 0.5 K/uL   Basophils Relative 0 %   Basophils Absolute 0.1 0.0 - 0.1 K/uL   Immature Granulocytes 1 %   Abs Immature Granulocytes 0.13 (H) 0.00 - 0.07 K/uL    Comment: Performed at [facility], [address] [place], [place] [address]  Type and screen [place]     Status: None (Preliminary result)   Collection Time: 01/06/21  6:46 AM  Result Value Ref Range   ABO/RH(D) PENDING    Antibody Screen PENDING    Sample Expiration      01/09/2021,2359 Performed at [facility], [address] [place], [place] [address]   Protime-INR      Status: None   Collection Time: 01/06/21  6:46 AM  Result Value Ref Range   Prothrombin Time 14.7 11.4 - 15.2 seconds   INR 1.2 0.8 - 1.2    Comment: (NOTE) INR goal varies based on device and disease states. Performed at [facility], [address] [place], [place] [address]   APTT     Status: None   Collection Time: 01/06/21  6:46 AM  Result Value Ref Range   aPTT 27 24 - 36 seconds    Comment: Performed at [facility], [address] [place], [place] [address]    DG Shoulder Right  Result Date: 01/06/2021 CLINICAL DATA:  [place]. Right shoulder pain. EXAM: RIGHT SHOULDER - 2+ VIEW COMPARISON:  None. FINDINGS: Mild glenohumeral joint degenerative changes but no fracture or dislocation. The visualized right ribs are intact and the visualized right lung is clear. IMPRESSION: No fracture or dislocation. Electronically Signed   By: Marijo Sanes M.D.   On: 01/06/2021 05:48   CT PELVIS WO CONTRAST  Result Date: 01/06/2021 CLINICAL DATA:  [place]. Abnormal radiographs. EXAM: CT PELVIS WITHOUT CONTRAST TECHNIQUE: Multidetector CT imaging of the pelvis was performed following the standard protocol without intravenous contrast. COMPARISON:  Radiographs, same date. FINDINGS: There is a mildly impacted basicervical neck fracture of the right hip. Both hips are normally located. Moderate degenerative changes bilaterally. No evidence of AVN. The pubic symphysis and SI joints are intact. No pelvic fractures or bone lesions. Incidental partial sacralization of L5. Grossly by CT hip and pelvic musculature are unremarkable. No obvious muscle tear or intramuscular hematoma. No significant intrapelvic abnormalities are identified. There are advanced atherosclerotic calcifications involving the aorta and iliac arteries and branch vessels. IMPRESSION: 1. Mildly impacted basicervical neck fracture of the right hip. 2. Moderate degenerative changes bilaterally. 3. No pelvic fractures or bone  lesions. 4. Advanced atherosclerotic calcifications involving the aorta and iliac arteries and branch vessels. Aortic Atherosclerosis (ICD10-I70.0). Electronically Signed   By: Marijo Sanes M.D.   On: 01/06/2021 06:22   DG Chest Portable 1 View  Result Date: 01/06/2021 CLINICAL DATA:  Preoperative chest x-ray. Right hip fracture. EXAM: PORTABLE CHEST 1 VIEW COMPARISON:  06/08/2018 FINDINGS: The cardiac silhouette, mediastinal and hilar contours are within normal limits. There is mild tortuosity and calcification of the thoracic aorta. The lungs are clear of an acute process. No pulmonary lesions or pleural effusions. The bony thorax is intact. IMPRESSION: No acute cardiopulmonary findings. Electronically Signed   By: Marijo Sanes M.D.   On: 01/06/2021 06:46   DG Hip Unilat W or Wo Pelvis 2-3 Views Right  Result Date: 01/06/2021 CLINICAL  DATA:  [place]. Right hip pain. EXAM: DG HIP (WITH OR WITHOUT PELVIS) 2-3V RIGHT COMPARISON:  None. FINDINGS: Both hips are normally located. Mild degenerative changes. Suspect a right femoral neck fracture with slight impaction/shortening. CT may be helpful for further evaluation. The pubic symphysis and SI joints are intact. No definite pelvic fractures. Significant age advanced vascular calcifications. IMPRESSION: Suspect right femoral neck fracture. CT may be helpful for further evaluation. Electronically Signed   By: Marijo Sanes M.D.   On: 01/06/2021 05:51    Review of Systems Blood pressure (!) 172/77, pulse 75, temperature (!) 97.2 F (36.2 C), temperature source Oral, resp. rate 12, height '6\' 1"'$  (1.854 m), weight 78.4 kg, SpO2 99 %. Physical Exam His right leg is slightly externally rotated compared to the left without shortening.  He is able flex extend the toes.  Skin is intact without ecchymosis Assessment/Plan: He has significant underlying osteoarthritis as well as impacted fracture with some apex anterior angulation.  I discussed treatment options  with him pinning versus hip replacement.  This is complicated by his underlying condition.  With pinning if it works he should be all remain ambulatory with lower risk but if there is displacement he did require second larger procedure or he could have the hip replacement now.  He will consider this and discuss with his wife and I will talk with him later today.  Hessie Knows 01/06/2021, 7:34 AM   After further discussion with the patient he said he like to go ahead and have his hip definitively treated.  We will plan on cemented stem for total hip with his underlying severe hip arthritis.

## 2021-01-06 NOTE — ED Notes (Signed)
Melissa RN aware of assigned bed

## 2021-01-06 NOTE — Op Note (Signed)
01/06/2021  7:03 PM  PATIENT:  Geoffrey Rangel  60 y.o. male  PRE-OPERATIVE DIAGNOSIS:  Right Hip Fracture, femoral neck fracture with osteoarthritis  POST-OPERATIVE DIAGNOSIS:  Right Hip Fracture same  PROCEDURE:  Procedure(s): TOTAL HIP ARTHROPLASTY ANTERIOR APPROACH (Right)  SURGEON: Laurene Footman, MD  ASSISTANTS: None  ANESTHESIA:   spinal  EBL: 400 cc  BLOOD ADMINISTERED:none  DRAINS:  Incisional wound VAC    LOCAL MEDICATIONS USED:  MARCAINE    and OTHER Exparel  SPECIMEN: Right femoral head  DISPOSITION OF SPECIMEN:  PATHOLOGY  COUNTS:  YES  TOURNIQUET:  * No tourniquets in log *  IMPLANTS: Medacta AMIS 4 standard cemented stem with 60 mm Mpact DM cup and liner with L 28 mm metal head  DICTATION: .Dragon Dictation  The patient was brought to the operating room and after spinal anesthesia was obtained patient was placed on the operative table with the ipsilateral foot into the Medacta attachment, contralateral leg on a well-padded table. C-arm was brought in and preop template x-ray taken. After prepping and draping in usual sterile fashion appropriate patient identification and timeout procedures were completed. Anterior approach to the hip was obtained and centered over the greater trochanter and TFL muscle. The subcutaneous tissue was incised hemostasis being achieved by electrocautery. TFL fascia was incised and the muscle retracted laterally deep retractor placed. The lateral femoral circumflex vessels were identified and ligated. The anterior capsule was exposed and a capsulotomy performed. The neck was identified and a femoral neck cut carried out with a saw. The head was removed without difficulty and showed sclerotic femoral head and acetabulum. Reaming was carried out to 60 mm and a 60 mm cup trial gave appropriate tightness to the acetabular component a 60 DM cup was impacted into position. The leg was then externally rotated and ischiofemoral and pubofemoral  releases carried out. The femur was sequentially broached to a size 5, so a 5#4 cement restrictor from [employer] was placed on the canal with canal dried cement placed and pressurized with a 4 standard stem placed.  After the cement had set and excess cement had removed sponge was removed from the acetabulum that had been placed to protect it trial was placed with an L 28 mm head and this was chosen for final component.  The bipolar head was impacted onto the cemented stem the hip was reduced and repeat x-ray showed good component position.  The hip was reduced and was stable the wound was thoroughly irrigated with fibrillar placed along the posterior capsule and medial neck. The deep fascia ws closed using a heavy Quill after infiltration of 30 cc of quarter percent Sensorcaine with epinephrine diluted with Exparel throughout the case .3-0 V-loc to close the skin with skin staples.  Incisional VAC applied and patient was sent to recovery in stable condition.  PLAN OF CARE: Admit to inpatient   PATIENT DISPOSITION:  PACU - hemodynamically stable.

## 2021-01-06 NOTE — ED Notes (Signed)
Report given to [facility] - all questions answered.

## 2021-01-06 NOTE — Progress Notes (Addendum)
[facility]) Hospitalized Hospice Patient Visit   Geoffrey Rangel is a current hospice patient with a terminal diagnosis of hypertensive chronic kidney disease with stage 5 chronic kidney disease or end stage renal disease. Notified by patient's wife that patient had fallen and was c/o right hip pain and that he could not walk. Instructed wife to call EMS for transport to ED. Notified patient was admitted for right femoral neck fracture. Per Dr. Gilford Rile with [facility], this is a related hospital admission.    Visited patient at bedside. States pain is decreased at this time. Denies needs that are not being met by hospital care team. Plan is for surgery today at approximately 5-6 pm to repair right femoral neck fracture. At this time, unsure of plan at discharge. Phone call to wife Geoffrey Rangel. Report exchanged with hospital care team.    Patient remains inpatient appropriate due to right femoral neck fracture with plan for surgical repair.   V/S: 98.2, 161/69, 66, 17, 100% RA sats      I/O: No intake documented/900    Abnormal Labs:  CO2: 12 (L) Glucose: 107 (H) BUN: 106 (H) Creatinine: 10.51 (H) Calcium: 8.2 (L) Anion gap: 17 (H) GFR, Estimated: 5 (L) WBC: 14.1 (H) RBC: 2.87 (L) Hemoglobin: 8.5 (L) HCT: 26.2 (L) NEUT#: 12.4 (H) RBC.: 2.95 (L)  Diagnostics:  EXAM: RIGHT SHOULDER - 2+ VIEW   IMPRESSION: No fracture or dislocation.  EXAM: CT PELVIS WITHOUT CONTRAST   IMPRESSION: 1. Mildly impacted basicervical neck fracture of the right hip. 2. Moderate degenerative changes bilaterally. 3. No pelvic fractures or bone lesions. 4. Advanced atherosclerotic calcifications involving the aorta and iliac arteries and branch vessels.   EXAM: PORTABLE CHEST 1 VIEW   IMPRESSION: No acute cardiopulmonary findings.  IV/PRN:  Reglan 10 mg IV x 1; Morphine 4 mg IV x 1; Ancef 2 gm IV x 1; Morphine 2 mg IV every 4 hours PRN x 1   Problem List: Principal  Problem:   Closed right hip fracture (HCC) Active Problems:   Hyperlipidemia   End stage renal disease ([place])   Fall at home, initial encounter   Hypertension   Tobacco abuse   Anemia in ESRD (end-stage renal disease) ([place])   Leukocytosis   Closed right hip fracture ([place]): X-ray of right hip/pelvis that showed right femoral neck fracture which is confirmed with CT scan of pelvis. No neurovascular compromise. Orthopedic surgeon, Dr. Rudene Christians was consulted, planning to do surgery today.   - will admit to Med-surg bed - Pain control: morphine prn and percocet - When necessary Zofran for nausea - Robaxin for muscle spasm - Appreciated Dr. consultation - type and cross - INR/PTT -PT/OT when able to (not ordered now)   Leukocytosis: WBC 14.1. Likely due to stress-induced demargination. Patient does not have signs of infection. -Follow-up CBC   Hyperlipidemia:  -Crestor   End stage renal disease ([place]): pt was on dialysis, but not compliant to dialysis, stopped doing HD, last HD was in 01/2020, still making urine. Potassium of 4.4, bicarbonate of 12, creatinine 10.51, BUN 106. -Consulted Dr. Candiss Norse of nephrology   Fall at home, initial encounter: -pt/ot when able to   Hypertension -IV hydralazine as needed -Continue home amlodipine and imdur   Tobacco abuse -Nicotine patch   Anemia in ESRD (end-stage renal disease) ([place]): Hemoglobin 8.5, 13.6 on 11/29/2019 -Check anemia panel   Discharge Planning: Ongoing. Surgery planned for evening of 10.11.22. Plan at discharge will be determined after  surgery.   Family Contact: Spoke with wife Geoffrey Rangel by phone.   IDT: Updated   Goals of Care: DNR, treat the treatable.   Please do not hesitate to call with any hospice related questions or concerns.    Thank you,    Bobbie "Loren Racer, [place], BSN [facility] Liaison [phone]

## 2021-01-06 NOTE — Progress Notes (Signed)
Initial Nutrition Assessment  DOCUMENTATION CODES:  Non-severe (moderate) malnutrition in context of chronic illness  INTERVENTION:  Advance to regular diet s/p surgery  Monitor intake trends - add supplementation if not meeting estimated needs  NUTRITION DIAGNOSIS:  Moderate Malnutrition related to chronic illness (CKD 5) as evidenced by mild fat depletion, mild muscle depletion.  GOAL:  Patient will meet greater than or equal to 90% of their needs  MONITOR:  PO intake, Skin, Supplement acceptance  REASON FOR ASSESSMENT:  Consult Hip fracture protocol  ASSESSMENT:  60 y.o. male with hx of ESRD (not on HD), GERD, HLD, and HTN presented to ED with hip pain after a fall at home.  Imaging in ED showed a right hip fx. Orthopedics consulting to discuss surgical repair options. Noted to have significant osteoarthritis.  Pt napping at the time of assessment, woke easily to name being called. Pt reports being quite hungry at the present, currently NPO for surgery this evening.   Pt denies any major weight changes recently, states he is usually about 165-168 lbs. ~4.2% weight loss noted in the last year (12/25/19-01/06/21) which is not severe. Reports a good appetite at home - does not follow any special diet for his renal function. Continues to make urine. Does not drink supplements at baseline. Poor dentition noted, no trouble chewing or swallowing.   Noted pt is followed by hospice at home. Will monitor for diet advancement and add nutrition supplements once diet is in place if intake is poor.   Nutritionally Relevant Medications: Continuous Infusions:  sodium chloride 50 mL/hr at 01/06/21 0748   promethazine (PHENERGAN) injection (IM or IVPB)     PRN Meds: ondansetron, promethazine  Labs Reviewed: BUN 106, creatinine 10.51  NUTRITION - FOCUSED PHYSICAL EXAM: Flowsheet Row Most Recent Value  Orbital Region Mild depletion  Upper Arm Region Moderate depletion  Thoracic and  Lumbar Region Moderate depletion  Buccal Region Mild depletion  Temple Region Mild depletion  Clavicle Bone Region Moderate depletion  Clavicle and Acromion Bone Region Moderate depletion  Scapular Bone Region Mild depletion  Dorsal Hand Mild depletion  Patellar Region Mild depletion  Anterior Thigh Region Mild depletion  Posterior Calf Region Mild depletion  Edema (RD Assessment) None  Hair Reviewed  Eyes Reviewed  Mouth Reviewed  Skin Reviewed  Nails Reviewed   Diet Order:   Diet Order             Diet NPO time specified Except for: Ice Chips, Sips with Meds  Diet effective now                  EDUCATION NEEDS:  No education needs have been identified at this time  Skin:  Skin Assessment:  (no skin assessment to review)  Last BM:  unsure  Height:  Ht Readings from Last 1 Encounters:  01/06/21 '6\' 1"'$  (1.854 m)   Weight:  Wt Readings from Last 1 Encounters:  01/06/21 78.4 kg   Ideal Body Weight:  83.6 kg  BMI:  Body mass index is 22.81 kg/m.  Estimated Nutritional Needs:  Kcal:  2000-2200 kcal/d Protein:  70-80 g/d Fluid:  1L + UOP   [name], RD, LDN Clinical Dietitian RD pager # available in [place]  After hours/weekend pager # available in [facility]

## 2021-01-07 ENCOUNTER — Encounter: Payer: Self-pay | Admitting: Orthopedic Surgery

## 2021-01-07 DIAGNOSIS — S72001A Fracture of unspecified part of neck of right femur, initial encounter for closed fracture: Principal | ICD-10-CM

## 2021-01-07 DIAGNOSIS — N186 End stage renal disease: Secondary | ICD-10-CM

## 2021-01-07 DIAGNOSIS — D631 Anemia in chronic kidney disease: Secondary | ICD-10-CM

## 2021-01-07 LAB — CBC
HCT: 24.1 % — ABNORMAL LOW (ref 39.0–52.0)
Hemoglobin: 7.9 g/dL — ABNORMAL LOW (ref 13.0–17.0)
MCH: 30.4 pg (ref 26.0–34.0)
MCHC: 32.8 g/dL (ref 30.0–36.0)
MCV: 92.7 fL (ref 80.0–100.0)
Platelets: 231 10*3/uL (ref 150–400)
RBC: 2.6 MIL/uL — ABNORMAL LOW (ref 4.22–5.81)
RDW: 14.2 % (ref 11.5–15.5)
WBC: 12.6 10*3/uL — ABNORMAL HIGH (ref 4.0–10.5)
nRBC: 0 % (ref 0.0–0.2)

## 2021-01-07 LAB — BASIC METABOLIC PANEL
Anion gap: 14 (ref 5–15)
BUN: 109 mg/dL — ABNORMAL HIGH (ref 6–20)
CO2: 17 mmol/L — ABNORMAL LOW (ref 22–32)
Calcium: 8.3 mg/dL — ABNORMAL LOW (ref 8.9–10.3)
Chloride: 105 mmol/L (ref 98–111)
Creatinine, Ser: 10.32 mg/dL — ABNORMAL HIGH (ref 0.61–1.24)
GFR, Estimated: 5 mL/min — ABNORMAL LOW (ref >60)
Glucose, Bld: 143 mg/dL — ABNORMAL HIGH (ref 70–99)
Potassium: 4.9 mmol/L (ref 3.5–5.1)
Sodium: 136 mmol/L (ref 135–145)

## 2021-01-07 LAB — HIV ANTIBODY (ROUTINE TESTING W REFLEX): HIV Screen 4th Generation wRfx: NONREACTIVE

## 2021-01-07 NOTE — Evaluation (Signed)
Physical Therapy Evaluation Patient Details Name: Geoffrey Rangel MRN: [id number] DOB: 04/27/1960 Today's Date: 01/07/2021  History of Present Illness  60 y/o male who fell while taking the last pieces of funtinure out of his home.  He suffered a R hip fx and is now s/p total hip replacement (anterior approach) 10/11.  Clinical Impression  Pt showed good effort though clearly having some hesitation/limitations due to R hip/incision pain.  He ultimately did well with exercises and was able to do AROM if now light resisted acts for most.  Additionally he was able to do some slow, but safe ambulation in the room with heavy walker reliance and slow cadence but no overt LOBs or safety issues.    Recommendations for follow up therapy are one component of a multi-disciplinary discharge planning process, led by the attending physician.  Recommendations may be updated based on patient status, additional functional criteria and insurance authorization.  Follow Up Recommendations Home health PT;Supervision - Intermittent    Equipment Recommendations  Rolling walker with 5" wheels;3in1 (PT)    Recommendations for Other Services       Precautions / Restrictions Precautions Precautions: Anterior Hip;Fall Restrictions Weight Bearing Restrictions: Yes RLE Weight Bearing: Weight bearing as tolerated      Mobility  Bed Mobility Overal bed mobility: Modified Independent             General bed mobility comments: slow/labored with getting to sitting EOB, but able to attain sitting w/o direct assist    Transfers Overall transfer level: Needs assistance Equipment used: Rolling walker (2 wheeled) Transfers: Sit to/from Stand Sit to Stand: Min assist         General transfer comment: Plenty of cuing for set up and sequencing, able to do most of transition with heavy UE assist, light assist from PT to attain fully upright  Ambulation/Gait Ambulation/Gait assistance: Min guard Gait Distance  (Feet): 30 Feet Assistive device: Rolling walker (2 wheeled)       General Gait Details: Pt with slow but steady gait in the room.  [employer] he walker and with some fatigue but no LOBs or need for direct assist  Stairs            Wheelchair Mobility    Modified Rankin (Stroke Patients Only)       Balance Overall balance assessment: Needs assistance Sitting-balance support: Single extremity supported Sitting balance-Leahy Scale: Good     Standing balance support: Bilateral upper extremity supported Standing balance-Leahy Scale: Fair                               Pertinent Vitals/Pain Pain Assessment: 0-10 Pain Score: 6  Pain Location: R hip    Home Living Family/patient expects to be discharged to:: Private residence Living Arrangements: Spouse/significant other Available Help at Discharge: Family Type of Home:  (currently out of old home and not yet moved into new home ~1 hour away...) Home Access:  (will have ~3 steps with rail at new home)     Home Layout: One level Home Equipment: Cane - single point      Prior Function Level of Independence: Independent         Comments: Pt typically does light yard work, [employer], drives, Health visitor [employer]        Extremity/Trunk Assessment   Upper Extremity Assessment Upper Extremity Assessment: Overall WFL for tasks assessed  Lower Extremity Assessment Lower Extremity Assessment:  (expected post-op weakness (especially R hip flexion) but otherwise [facility])       Communication   Communication: No difficulties  Cognition Arousal/Alertness: Awake/alert Behavior During Therapy: WFL for tasks assessed/performed Overall Cognitive Status: Within Functional Limits for tasks assessed                                        General Comments      Exercises Total Joint Exercises Ankle Circles/Pumps: AROM;10 reps Quad Sets: Strengthening;10 reps Heel Slides: AROM;10  reps Hip ABduction/ADduction: Strengthening;AROM;10 reps   Assessment/Plan    PT Assessment Patient needs continued PT services  PT Problem List Decreased strength;Decreased range of motion;Decreased activity tolerance;Decreased balance;Decreased mobility;Decreased knowledge of use of DME;Decreased safety awareness;Pain;Decreased knowledge of precautions       PT Treatment Interventions Gait training;Stair training;Therapeutic activities;Functional mobility training;DME instruction;Therapeutic exercise;Neuromuscular re-education;Balance training;Patient/family education    PT Goals (Current goals can be found in the Care Plan section)  Acute Rehab PT Goals Patient Stated Goal: back to walking PT Goal Formulation: With patient Time For Goal Achievement: 01/21/21 Potential to Achieve Goals: Good    Frequency BID   Barriers to discharge        Co-evaluation               AM-PAC PT "6 Clicks" Mobility  Outcome Measure Help needed turning from your back to your side while in a flat bed without using bedrails?: None Help needed moving from lying on your back to sitting on the side of a flat bed without using bedrails?: A Little Help needed moving to and from a bed to a chair (including a wheelchair)?: A Little Help needed standing up from a chair using your arms (e.g., wheelchair or bedside chair)?: A Little Help needed to walk in hospital room?: A Little Help needed climbing 3-5 steps with a railing? : A Lot 6 Click Score: 18    End of Session Equipment Utilized During Treatment: Gait belt Activity Tolerance: Patient limited by pain;Patient tolerated treatment well Patient left: with chair alarm set;with call bell/phone within reach Nurse Communication: Mobility status PT Visit Diagnosis: Muscle weakness (generalized) (M62.81);Difficulty in walking, not elsewhere classified (R26.2);Pain Pain - Right/Left: Right Pain - part of body: Hip    Time: [id number] PT Time  Calculation (min) (ACUTE ONLY): 31 min   Charges:   PT Evaluation $PT Eval Low Complexity: 1 Low PT Treatments $Gait Training: 8-22 mins $Therapeutic Exercise: 8-22 mins        Kreg Shropshire, DPT 01/07/2021, 10:51 AM

## 2021-01-07 NOTE — TOC Progression Note (Signed)
Transition of Care (TOC) - Progression Note    Patient Details  Name: Geoffrey Rangel MRN: [id number] Date of Birth: 12/19/1960  Transition of Care (TOC) CM/SW Contact  Deliliah J Louvet, RN Phone Number: 01/07/2021, 10:59 AM  Clinical Narrative:  Met with the patient to discuss DC plan and needs He stated that he is open with and followed by [facility], He will continue to be followed by them, he stated that he is not willing to go to [facility]  notified the hospice liason and they will order him a RW and a 3 in 1 to be delivered to his home, he stated that he is in the process of moving and his previous house is Empty, he is wanting to stay in the hospital until Friday due to him feeling nauseated and not ready to DC,  He stated that his grand sons are coming on Saturday to unload the moving trucks, his wife is working out the details with him being in the hospital ,The hospice liason is going to see if they can get their PT to go see him at home once he discharges. TOC to continue to monitor for needs and assist with DC         Expected Discharge Plan and Services                                                 Social Determinants of Health (SDOH) Interventions    Readmission Risk Interventions No flowsheet data found.  

## 2021-01-07 NOTE — Progress Notes (Signed)
   Subjective: 1 Day Post-Op Procedure(s) (LRB): TOTAL HIP ARTHROPLASTY ANTERIOR APPROACH (Right) Patient reports pain as moderate.   Patient is well, and has had no acute complaints or problems Denies any CP, SOB, ABD pain. We will start physical therapy today.   Objective: Vital signs in last 24 hours: Temp:  [97 F (36.1 C)-98.2 F (36.8 C)] 98.1 F (36.7 C) (10/12 0803) Pulse Rate:  [66-93] 91 (10/12 0803) Resp:  [16-20] 20 (10/12 0803) BP: (126-175)/(59-78) 145/76 (10/12 0803) SpO2:  [98 %-100 %] 98 % (10/12 0803)  Intake/Output from previous day: 10/11 0701 - 10/12 0700 In: 700 [I.V.:500; IV Piggyback:200] Out: 3250 [Urine:3250] Intake/Output this shift: No intake/output data recorded.  Recent Labs    01/06/21 0646 01/06/21 1904 01/07/21 0619  HGB 8.5* 8.1* 7.9*   Recent Labs    01/06/21 0646 01/06/21 0839 01/06/21 1904 01/07/21 0619  WBC 14.1*  --   --  12.6*  RBC 2.87* 2.95*  --  2.60*  HCT 26.2*  --  24.5* 24.1*  PLT 244  --   --  231   Recent Labs    01/06/21 0646 01/07/21 0619  NA 139 136  K 4.4 4.9  CL 110 105  CO2 12* 17*  BUN 106* 109*  CREATININE 10.51* 10.32*  GLUCOSE 107* 143*  CALCIUM 8.2* 8.3*   Recent Labs    01/06/21 0646  INR 1.2    EXAM General - Patient is Alert, Appropriate, and Oriented Extremity - Neurovascular intact Sensation intact distally Intact pulses distally Dorsiflexion/Plantar flexion intact Incision: dressing C/D/I and no drainage No cellulitis present Compartment soft Dressing - dressing C/D/I and no drainage, Praveena intact without drainage Motor Function - intact, moving foot and toes well on exam.   Past Medical History:  Diagnosis Date   Arthritis    Chronic kidney disease    Dyspnea    occasional   GERD (gastroesophageal reflux disease)    Hyperlipidemia    Hypertension    Renal disorder     Assessment/Plan:   1 Day Post-Op Procedure(s) (LRB): TOTAL HIP ARTHROPLASTY ANTERIOR APPROACH  (Right) Principal Problem:   Closed right hip fracture (HCC) Active Problems:   Hyperlipidemia   End stage renal disease (HCC)   Fall at home, initial encounter   Hypertension   Tobacco abuse   Anemia in ESRD (end-stage renal disease) (HCC)   Leukocytosis   Malnutrition of moderate degree  Estimated body mass index is 22.81 kg/m as calculated from the following:   Height as of this encounter: '6\' 1"'$  (1.854 m).   Weight as of this encounter: 78.4 kg. Advance diet Up with therapy Work on bowel movement Vital signs are stable Acute on chronic anemia -hemoglobin 7.9.  Continue to monitor. Pain controlled Care manager to assist with discharge.    Patient will need 2-week follow-up with orthopedics. TED hose bilateral lower extremities x6 weeks  DVT Prophylaxis - Foot Pumps, TED hose, and heparin Weight-Bearing as tolerated to right leg   T. Rachelle Hora, PA-C [place] 01/07/2021, 8:08 AM

## 2021-01-07 NOTE — Assessment & Plan Note (Signed)
--  continue hospice, still makes urine

## 2021-01-07 NOTE — Progress Notes (Signed)
[facility]  ROUNDING NOTE   Subjective:   Geoffrey Rangel is a 60 y.o. male with past medical history of hypertension, hyperlipidemia, GERD, anemia, BPH, and end stage renal disease stopped dialysis Nov 2021. Patient presents to ED after fall and right hip pain.   Patient is known to our clinic and received dialysis at [facility], supervised by Dr Candiss Norse. He decided to stop dialysis last year stating he was tired of getting stuck with needles.   Patient seen sitting up in chair, complains of right hip pain. States surgery went well, discharge planning for Friday. States he has not felt any further about home dialysis but gives permission to receive a call from the home dialysis educator.  Maintains adequate appetite and denies shortness of breath   Objective:  Vital signs in last 24 hours:  Temp:  [97 F (36.1 C)-98.2 F (36.8 C)] 98.2 F (36.8 C) (10/12 1125) Pulse Rate:  [78-93] 84 (10/12 1125) Resp:  [16-20] 18 (10/12 1125) BP: (126-158)/(59-78) 154/65 (10/12 1125) SpO2:  [98 %-100 %] 100 % (10/12 1125)  Weight change:  Filed Weights   01/06/21 0506  Weight: 78.4 kg    Intake/Output: I/O last 3 completed shifts: In: 700 [I.V.:500; IV Piggyback:200] Out: [id number] [Urine:3250]   Intake/Output this shift:  Total I/O In: 240 [P.O.:240] Out: -   Physical Exam: General: NAD, sitting in chair  Head: Normocephalic, atraumatic. Moist oral mucosal membranes  Eyes: Anicteric  Lungs:  Clear to auscultation, normal effort  Heart: Regular rate and rhythm  Abdomen:  Soft, nontender  Extremities:  no peripheral edema.  Neurologic: Nonfocal, moving all four extremities  Skin: No lesions, NPWV at right hip  Access: Rt AVF    Basic Metabolic Panel: Recent Labs  Lab 01/06/21 0646 01/07/21 0619  NA 139 136  K 4.4 4.9  CL 110 105  CO2 12* 17*  GLUCOSE 107* 143*  BUN 106* 109*  CREATININE 10.51* 10.32*  CALCIUM 8.2* 8.3*  PHOS 9.7*  --      Liver  Function Tests: No results for input(s): AST, ALT, ALKPHOS, BILITOT, PROT, ALBUMIN in the last 168 hours. No results for input(s): LIPASE, AMYLASE in the last 168 hours. No results for input(s): AMMONIA in the last 168 hours.  CBC: Recent Labs  Lab 01/06/21 0646 01/06/21 1904 01/07/21 0619  WBC 14.1*  --  12.6*  NEUTROABS 12.4*  --   --   HGB 8.5* 8.1* 7.9*  HCT 26.2* 24.5* 24.1*  MCV 91.3  --  92.7  PLT 244  --  231     Cardiac Enzymes: No results for input(s): CKTOTAL, CKMB, CKMBINDEX, TROPONINI in the last 168 hours.  BNP: Invalid input(s): POCBNP  CBG: No results for input(s): GLUCAP in the last 168 hours.  Microbiology: Results for orders placed or performed during the hospital encounter of 01/06/21  Resp Panel by RT-PCR (Flu A&B, Covid) Nasopharyngeal Swab     Status: None   Collection Time: 01/06/21  6:46 AM   Specimen: Nasopharyngeal Swab; Nasopharyngeal(NP) swabs in vial transport medium  Result Value Ref Range Status   SARS Coronavirus 2 by RT PCR NEGATIVE NEGATIVE Final    Comment: (NOTE) SARS-CoV-2 target nucleic acids are NOT DETECTED.  The SARS-CoV-2 RNA is generally detectable in upper respiratory specimens during the acute phase of infection. The lowest concentration of SARS-CoV-2 viral copies this assay can detect is 138 copies/mL. A negative result does not preclude SARS-Cov-2 infection and should not be  used as the sole basis for treatment or other patient management decisions. A negative result may occur with  improper specimen collection/handling, submission of specimen other than nasopharyngeal swab, presence of viral mutation(s) within the areas targeted by this assay, and inadequate number of viral copies(<138 copies/mL). A negative result must be combined with clinical observations, patient history, and epidemiological information. The expected result is Negative.  Fact Sheet for Patients:  EntrepreneurPulse.com.au  Fact  Sheet for Healthcare Providers:  IncredibleEmployment.be  This test is no t yet approved or cleared by the [place] FDA and  has been authorized for detection and/or diagnosis of SARS-CoV-2 by FDA under an Emergency Use Authorization (EUA). This EUA will remain  in effect (meaning this test can be used) for the duration of the COVID-19 declaration under Section 564(b)(1) of the Act, 21 U.S.C.section 360bbb-3(b)(1), unless the authorization is terminated  or revoked sooner.       Influenza A by PCR NEGATIVE NEGATIVE Final   Influenza B by PCR NEGATIVE NEGATIVE Final    Comment: (NOTE) The Xpert Xpress SARS-CoV-2/FLU/RSV plus assay is intended as an aid in the diagnosis of influenza from Nasopharyngeal swab specimens and should not be used as a sole basis for treatment. Nasal washings and aspirates are unacceptable for Xpert Xpress SARS-CoV-2/FLU/RSV testing.  Fact Sheet for Patients: EntrepreneurPulse.com.au  Fact Sheet for Healthcare Providers: IncredibleEmployment.be  This test is not yet approved or cleared by the [place] FDA and has been authorized for detection and/or diagnosis of SARS-CoV-2 by FDA under an Emergency Use Authorization (EUA). This EUA will remain in effect (meaning this test can be used) for the duration of the COVID-19 declaration under Section 564(b)(1) of the Act, 21 U.S.C. section 360bbb-3(b)(1), unless the authorization is terminated or revoked.  Performed at [facility], [address] [place], [place] [address]     Coagulation Studies: Recent Labs    01/06/21 0646  LABPROT 14.7  INR 1.2     Urinalysis: No results for input(s): COLORURINE, LABSPEC, PHURINE, GLUCOSEU, HGBUR, BILIRUBINUR, KETONESUR, PROTEINUR, UROBILINOGEN, NITRITE, LEUKOCYTESUR in the last 72 hours.  Invalid input(s): APPERANCEUR    Imaging: DG Shoulder Right  Result Date:  01/06/2021 CLINICAL DATA:  [place]. Right shoulder pain. EXAM: RIGHT SHOULDER - 2+ VIEW COMPARISON:  None. FINDINGS: Mild glenohumeral joint degenerative changes but no fracture or dislocation. The visualized right ribs are intact and the visualized right lung is clear. IMPRESSION: No fracture or dislocation. Electronically Signed   By: Marijo Sanes M.D.   On: 01/06/2021 05:48   CT PELVIS WO CONTRAST  Result Date: 01/06/2021 CLINICAL DATA:  [place]. Abnormal radiographs. EXAM: CT PELVIS WITHOUT CONTRAST TECHNIQUE: Multidetector CT imaging of the pelvis was performed following the standard protocol without intravenous contrast. COMPARISON:  Radiographs, same date. FINDINGS: There is a mildly impacted basicervical neck fracture of the right hip. Both hips are normally located. Moderate degenerative changes bilaterally. No evidence of AVN. The pubic symphysis and SI joints are intact. No pelvic fractures or bone lesions. Incidental partial sacralization of L5. Grossly by CT hip and pelvic musculature are unremarkable. No obvious muscle tear or intramuscular hematoma. No significant intrapelvic abnormalities are identified. There are advanced atherosclerotic calcifications involving the aorta and iliac arteries and branch vessels. IMPRESSION: 1. Mildly impacted basicervical neck fracture of the right hip. 2. Moderate degenerative changes bilaterally. 3. No pelvic fractures or bone lesions. 4. Advanced atherosclerotic calcifications involving the aorta and iliac arteries and branch vessels. Aortic Atherosclerosis (ICD10-I70.0). Electronically Signed  By: Marijo Sanes M.D.   On: 01/06/2021 06:22   DG Chest Portable 1 View  Result Date: 01/06/2021 CLINICAL DATA:  Preoperative chest x-ray. Right hip fracture. EXAM: PORTABLE CHEST 1 VIEW COMPARISON:  06/08/2018 FINDINGS: The cardiac silhouette, mediastinal and hilar contours are within normal limits. There is mild tortuosity and calcification of the thoracic aorta.  The lungs are clear of an acute process. No pulmonary lesions or pleural effusions. The bony thorax is intact. IMPRESSION: No acute cardiopulmonary findings. Electronically Signed   By: Marijo Sanes M.D.   On: 01/06/2021 06:46   DG HIP OPERATIVE UNILAT W OR W/O PELVIS RIGHT  Result Date: 01/06/2021 CLINICAL DATA:  Hip replacement EXAM: OPERATIVE right HIP (WITH PELVIS IF PERFORMED) 4 VIEWS TECHNIQUE: Fluoroscopic spot image(s) were submitted for interpretation post-operatively. COMPARISON:  CT and radiograph 01/06/2021 FINDINGS: Intraoperative images during right hip arthroplasty for a femoral neck fracture. Intact hardware without evidence of loosening or fracture. Normal alignment. IMPRESSION: Intraoperative images during right hip arthroplasty. No evidence of immediate hardware complication. Electronically Signed   By: Maurine Simmering M.D.   On: 01/06/2021 20:47   DG HIP UNILAT W OR W/O PELVIS 2-3 VIEWS RIGHT  Result Date: 01/06/2021 CLINICAL DATA:  Right hip arthroplasty, postoperative pain EXAM: DG HIP (WITH OR WITHOUT PELVIS) 2-3V RIGHT COMPARISON:  01/06/2021 FINDINGS: Frontal view of the pelvis and a cross-table lateral view of the right hip are obtained. The iliac crests are excluded by collimation. Right hip arthroplasty is identified in the expected position without signs of acute complication. There are no acute displaced fractures. Postsurgical changes are seen in the soft tissues overlying the right hip. There is extensive atherosclerosis. IMPRESSION: 1. Unremarkable right hip arthroplasty. Electronically Signed   By: Randa Ngo M.D.   On: 01/06/2021 19:59   DG Hip Unilat W or Wo Pelvis 2-3 Views Right  Result Date: 01/06/2021 CLINICAL DATA:  [place]. Right hip pain. EXAM: DG HIP (WITH OR WITHOUT PELVIS) 2-3V RIGHT COMPARISON:  None. FINDINGS: Both hips are normally located. Mild degenerative changes. Suspect a right femoral neck fracture with slight impaction/shortening. CT may be helpful  for further evaluation. The pubic symphysis and SI joints are intact. No definite pelvic fractures. Significant age advanced vascular calcifications. IMPRESSION: Suspect right femoral neck fracture. CT may be helpful for further evaluation. Electronically Signed   By: Marijo Sanes M.D.   On: 01/06/2021 05:51     Medications:    sodium chloride 50 mL/hr at 01/06/21 0748   promethazine (PHENERGAN) injection (IM or IVPB)      amLODipine  2.5 mg Oral Daily   docusate sodium  100 mg Oral BID   gabapentin  100 mg Oral TID   heparin injection (subcutaneous)  5,000 Units Subcutaneous Q8H   isosorbide mononitrate  30 mg Oral Daily   nicotine  21 mg Transdermal Daily   rosuvastatin  5 mg Oral Daily   tamsulosin  0.4 mg Oral Daily   acetaminophen, albuterol, alum & mag hydroxide-simeth, bisacodyl, dextromethorphan-guaiFENesin, diphenhydrAMINE, hydrALAZINE, menthol-cetylpyridinium **OR** phenol, methocarbamol, morphine injection, nitroGLYCERIN, ondansetron (ZOFRAN) IV, ondansetron **OR** ondansetron (ZOFRAN) IV, oxyCODONE-acetaminophen, promethazine (PHENERGAN) injection (IM or IVPB), senna-docusate  Assessment/ Plan:  Geoffrey Rangel is a 60 y.o.  male with past medical history of hypertension, hyperlipidemia, GERD, anemia, BPH, and end stage renal disease stopped dialysis Nov 2021. Patient has been admitted for Closed right hip fracture ([place]) [S72.001A] Fall, initial encounter [W19.XXXA] Closed fracture of right hip, initial encounter ([place]) [S72.001A]  End stage renal disease: Stopped dialysis in Nov 2021. States he does not want to start dialysis.  Tolerated surgery and anesthesia well.  Is agreeable to speaking with the home dialysis educator about peritoneal dialysis.  Not interested in chronic hemodialysis.  Currently followed by hospice.  We will continue to follow patient during inpatient though may not see daily  Anemia of chronic kidney disease Lab Results  Component Value Date   HGB 7.9  (L) 01/07/2021  Hgb below target, expected due to surgery.  We will continue to monitor  3.Secondary Hyperparathyroidism:  Lab Results  Component Value Date   PTH 227 (H) 06/08/2018   CALCIUM 8.3 (L) 01/07/2021   PHOS 9.7 (H) 01/06/2021    Calcium and phosphorus not at goal  4. Hypertension with chronic renal disease BP 154/65 Currently on Amlodipine and isosorbide monomitrate.  Continue current treatment  5. Closed right hip fracture Appreciate orthopedics performing a right anterior total hip arthroplasty yesterday.  Currently receiving PT/OT for evaluation of rehab needs   LOS: 1 [place] 10/12/202212:27 PM

## 2021-01-07 NOTE — Assessment & Plan Note (Signed)
--  home PT

## 2021-01-07 NOTE — Progress Notes (Signed)
Physical Therapy Treatment Patient Details Name: Geoffrey Rangel MRN: [id number] DOB: 11-May-1960 Today's Date: 01/07/2021   History of Present Illness 60 y/o male who fell while taking the last pieces of funtinure out of his home before moving.  He suffered a R hip fx and is now s/p total hip replacement (anterior approach) 10/11.    PT Comments    Pt did well with PT session despite continued pain in anterior hip.  He was able to essentially double his ambulation distance from eval this morning and though he still struggles with some exercises (especially hip flexion) he showed good effort and improved strength.  Pt eager to work with PT but pain was a limiter this afternoon.     Recommendations for follow up therapy are one component of a multi-disciplinary discharge planning process, led by the attending physician.  Recommendations may be updated based on patient status, additional functional criteria and insurance authorization.  Follow Up Recommendations  Home health PT;Supervision - Intermittent     Equipment Recommendations  Rolling walker with 5" wheels;3in1 (PT)    Recommendations for Other Services       Precautions / Restrictions Precautions Precautions: Anterior Hip;Fall Restrictions Weight Bearing Restrictions: No RLE Weight Bearing: Weight bearing as tolerated     Mobility  Bed Mobility Overal bed mobility: Modified Independent             General bed mobility comments: slow to get LEs out/in bed but able to do so w/o direct assit    Transfers Overall transfer level: Modified independent Equipment used: Rolling walker (2 wheeled) Transfers: Sit to/from Stand Sit to Stand: Min guard         General transfer comment: close supervision and plenty of cuing, but pt able to rise from standard height bed w/o assist  Ambulation/Gait Ambulation/Gait assistance: Min guard Gait Distance (Feet): 60 Feet Assistive device: Rolling walker (2 wheeled)        General Gait Details: Pt with effortful but safe ambulation.  Heavy reliance on the walker, c/o increased R hip pain with the effort.   Stairs             Wheelchair Mobility    Modified Rankin (Stroke Patients Only)       Balance Overall balance assessment: Needs assistance Sitting-balance support: Single extremity supported Sitting balance-Leahy Scale: Good     Standing balance support: Bilateral upper extremity supported Standing balance-Leahy Scale: Fair                              Cognition Arousal/Alertness: Awake/alert Behavior During Therapy: WFL for tasks assessed/performed Overall Cognitive Status: Within Functional Limits for tasks assessed                                        Exercises Total Joint Exercises Ankle Circles/Pumps: AROM;10 reps Quad Sets: Strengthening;10 reps Short Arc Quad: Strengthening;10 reps Heel Slides: Strengthening;10 reps;AROM (resisted extensions) Hip ABduction/ADduction: Strengthening;AROM;10 reps Other Exercises Other Exercises: OT engages pt in LB dressing ADLs as well as education re: use of AE for LB ADLs and ed re: bathing considerations with wound vac-sponge bathe until vac removed.    General Comments        Pertinent Vitals/Pain Pain Assessment: 0-10 Pain Score: 7  Pain Location: anterior hip Pain Descriptors / Indicators: Sore Pain Intervention(s): Limited  activity within patient's tolerance;Monitored during session;Repositioned    Home Living Family/patient expects to be discharged to:: Private residence Living Arrangements: Spouse/significant other Available Help at Discharge: Family Type of Home:  (currently out of old home and not yet moved into new home ~1 hour away...) Home Access:  (currently out of old home and not yet moved into new home ~1 hour away...)   Home Layout: One level Home Equipment: Cane - single point      Prior Function Level of Independence:  Independent      Comments: Pt typically does light yard work, [employer], drives, etc   PT Goals (current goals can now be found in the care plan section) Acute Rehab PT Goals Patient Stated Goal: back to walking Progress towards PT goals: Progressing toward goals    Frequency    BID      PT Plan Current plan remains appropriate    Co-evaluation              AM-PAC PT "6 Clicks" Mobility   Outcome Measure  Help needed turning from your back to your side while in a flat bed without using bedrails?: None Help needed moving from lying on your back to sitting on the side of a flat bed without using bedrails?: A Little Help needed moving to and from a bed to a chair (including a wheelchair)?: A Little Help needed standing up from a chair using your arms (e.g., wheelchair or bedside chair)?: A Little Help needed to walk in hospital room?: A Little Help needed climbing 3-5 steps with a railing? : A Lot 6 Click Score: 18    End of Session Equipment Utilized During Treatment: Gait belt Activity Tolerance: Patient limited by pain;Patient tolerated treatment well Patient left: with bed alarm set;with call bell/phone within reach Nurse Communication: Mobility status PT Visit Diagnosis: Muscle weakness (generalized) (M62.81);Difficulty in walking, not elsewhere classified (R26.2);Pain Pain - Right/Left: Right Pain - part of body: Hip     Time: [id number] PT Time Calculation (min) (ACUTE ONLY): 31 min  Charges:  $Gait Training: 8-22 mins $Therapeutic Exercise: 8-22 mins                     Kreg Shropshire, DPT 01/07/2021, 6:05 PM

## 2021-01-07 NOTE — Assessment & Plan Note (Signed)
stable °

## 2021-01-07 NOTE — Progress Notes (Addendum)
[facility]) Hospitalized Hospice Patient Visit Geoffrey Rangel is a current hospice patient with a terminal diagnosis of hypertensive chronic kidney disease with stage 5 chronic kidney disease or end stage renal disease. Notified by patient's wife that patient had fallen and was c/o right hip pain and that he could not walk. Instructed wife to call EMS for transport to ED. Notified patient was admitted for right femoral neck fracture. Per Dr. Gilford Rile with [facility], this is a related hospital admission.   Visited patient in room. He is sitting up in the recliner, he just had a bout of nausea/vomiting after working with PT. He reports that the pain is intense but that the nurse just gave him some pain medication. Patient reports that it is his plan to go home after surgery, he does not want to go to rehab. He wants to continue with hospice services.    Patient remains inpatient appropriate due to right femoral neck fracture, surgical repair completed last night.   V/S: T 98.2, HR 84, RR 18, BP 154/65, spO2 100% room air  I/O: 940/3250  Abnormal Labs: CO2 17, Glucose 143, BUN 109, Creatinine 10.32, Ca+ 8.3, WBC 12.6, HGB 7.9, HCT 24.1  Diagnostics: None new   IV/PRN:  Percocet 5/325 q4 hours x2, Zofran '4mg'$  IV x1; Morphine 2 mg IV every 4 hours PRN x 1, Robaxin '500mg'$ x1  Problem List: Principal Problem:   Closed right hip fracture (HCC) Active Problems:   Hyperlipidemia   End stage renal disease ([place])   Fall at home, initial encounter   Hypertension   Tobacco abuse   Anemia in ESRD (end-stage renal disease) ([place])   Leukocytosis   Discharge Planning: Ongoing. Patient wants to go home and currently this is therapy's recommendation   Family Contact: SW spoke with wife   IDT: Updated   Goals of Care: DNR, treat the treatable.  Transfer summary and medication list placed on patient's hard chart.  Thank you,  Jhonnie Garner, BSN, RN, [facility] hospital  liaison [phone]

## 2021-01-07 NOTE — Evaluation (Signed)
Occupational Therapy Evaluation Patient Details Name: Geoffrey Rangel MRN: [id number] DOB: [birth year] Today's Date: 01/07/2021   History of Present Illness 60 y/o male who fell while taking the last pieces of funtinure out of his home.  He suffered a R hip fx and is now s/p total hip replacement (anterior approach) 10/11.   Clinical Impression   Pt seen for OT evaluation this date in setting of acute hospitalization d/t fall and subsequent THR. Pt reports being INDEP with ADLs/ADL mobility at baseline. Pt presents this date with some pain limitations to R hip limiting his ability to perform LB ADLs. He requires MIN A for LB ADLs d/t decreased R hip ROM and requires MIN A for STS with RW. OT educates re: LB ADL task modification. Pt with good understanding. Will continue to follow, anticipate pt will benefit from [facility] f/u to ensure improved INDEP with self care in natural environment.      Recommendations for follow up therapy are one component of a multi-disciplinary discharge planning process, led by the attending physician.  Recommendations may be updated based on patient status, additional functional criteria and insurance authorization.   Follow Up Recommendations  Home health OT    Equipment Recommendations  3 in 1 bedside commode;Tub/shower seat    Recommendations for Other Services       Precautions / Restrictions Precautions Precautions: Anterior Hip;Fall Restrictions Weight Bearing Restrictions: No RLE Weight Bearing: Weight bearing as tolerated      Mobility Bed Mobility               General bed mobility comments: up to chair pre/post    Transfers Overall transfer level: Needs assistance Equipment used: Rolling walker (2 wheeled) Transfers: Sit to/from Stand Sit to Stand: Min assist         General transfer comment: cues for hand placement, slight assist to get hips to full extension    Balance Overall balance assessment: Needs  assistance Sitting-balance support: Single extremity supported Sitting balance-Leahy Scale: Good     Standing balance support: Bilateral upper extremity supported Standing balance-Leahy Scale: Fair                             ADL either performed or assessed with clinical judgement   ADL                                         General ADL Comments: Pt requires MIN A for LB ADLs, INDEP for seated UB ADLs, MINA/CGA for ADL transfers with RW.     Vision         Perception     Praxis      Pertinent Vitals/Pain Pain Assessment: 0-10 Pain Score: 6  Pain Location: R hip Pain Descriptors / Indicators: Sore Pain Intervention(s): Limited activity within patient's tolerance;Monitored during session;Repositioned     Hand Dominance     Extremity/Trunk Assessment Upper Extremity Assessment Upper Extremity Assessment: Overall WFL for tasks assessed   Lower Extremity Assessment Lower Extremity Assessment:  (Pt with expected post-op limited R hip ROM)       Communication Communication Communication: No difficulties   Cognition Arousal/Alertness: Awake/alert Behavior During Therapy: WFL for tasks assessed/performed Overall Cognitive Status: Within Functional Limits for tasks assessed  General Comments       Exercises Other Exercises Other Exercises: OT engages pt in LB dressing ADLs as well as education re: use of AE for LB ADLs and ed re: bathing considerations with wound vac-sponge bathe until vac removed.   Shoulder Instructions      Home Living Family/patient expects to be discharged to:: Private residence Living Arrangements: Spouse/significant other Available Help at Discharge: Family Type of Home:  (currently out of old home and not yet moved into new home ~1 hour away...) Home Access:  (currently out of old home and not yet moved into new home ~1 hour away...)     Home Layout: One  level               Home Equipment: Cane - single point          Prior Functioning/Environment Level of Independence: Independent        Comments: Pt typically does light yard work, [employer], drives, etc        OT Problem List: Decreased range of motion;Decreased activity tolerance;Decreased knowledge of use of DME or AE;Pain      OT Treatment/Interventions: Self-care/ADL training;Therapeutic exercise;DME and/or AE instruction;Therapeutic activities;Patient/family education    OT Goals(Current goals can be found in the care plan section) Acute Rehab OT Goals Patient Stated Goal: back to walking OT Goal Formulation: With patient Time For Goal Achievement: 01/21/21 Potential to Achieve Goals: Good ADL Goals Pt Will Perform Lower Body Dressing: with min guard assist;with adaptive equipment;sit to/from stand;with supervision Pt Will Transfer to Toilet: with supervision;ambulating Pt Will Perform Toileting - Clothing Manipulation and hygiene: with supervision;sit to/from stand  OT Frequency: Min 2X/week   Barriers to D/C:            Co-evaluation              AM-PAC OT "6 Clicks" Daily Activity     Outcome Measure Help from another person eating meals?: None Help from another person taking care of personal grooming?: A Little Help from another person toileting, which includes using toliet, bedpan, or urinal?: A Little Help from another person bathing (including washing, rinsing, drying)?: A Little Help from another person to put on and taking off regular upper body clothing?: None Help from another person to put on and taking off regular lower body clothing?: A Little 6 Click Score: 20   End of Session Equipment Utilized During Treatment: Gait belt;Rolling walker Nurse Communication: Mobility status  Activity Tolerance: Patient tolerated treatment well Patient left: in chair;with call bell/phone within reach;with chair alarm set  OT Visit Diagnosis:  Muscle weakness (generalized) (M62.81);Pain Pain - Right/Left: Right Pain - part of body: Hip                Time: [id number] OT Time Calculation (min): 22 min Charges:  OT General Charges $OT Visit: 1 Visit OT Evaluation $OT Eval Low Complexity: 1 Low OT Treatments $Self Care/Home Management : 8-22 mins  Gerrianne Scale, MS, OTR/L ascom [phone] 01/07/21, 4:20 PM

## 2021-01-07 NOTE — Assessment & Plan Note (Addendum)
--   Management per orthopedics, postop day 1 -- Home with hospice planned 10/13.  Patient moving into new place, currently has no bed in house

## 2021-01-07 NOTE — Progress Notes (Signed)
  Progress Note    Geoffrey Rangel   [id number]  DOB: March 28, 1961  DOA: 01/06/2021     1 Date of Service: 01/07/2021   60 year old man PMH including ESRD not on dialysis no, in  hospice presented with fall resulting in right hip pain.  Admitted for right hip fracture --10/11 s/p right THA       Subjective:  feels ok, tolerating diet  Hospital Problems * Closed right hip fracture ([place]) -- Management per orthopedics, postop day 1 -- Home with hospice planned 10/13.  Patient moving into new place, currently has no bed in house  Fall at home, initial encounter --home PT  End stage renal disease ([place]) --continue hospice, still makes urine  Anemia in ESRD (end-stage renal disease) ([place]) --stable   Objective Vital signs were reviewed and unremarkable.  Vitals:   01/07/21 0349 01/07/21 0803 01/07/21 1125 01/07/21 1607  BP: (!) 158/78 (!) 145/76 (!) 154/65 133/72  Pulse: 79 91 84 86  Resp: '16 20 18 16  '$ Temp: (!) 97.5 F (36.4 C) 98.1 F (36.7 C) 98.2 F (36.8 C) 98.2 F (36.8 C)  TempSrc: Oral     SpO2: 100% 98% 100% 100%  Weight:      Height:       78.4 kg  Exam Physical Exam Vitals reviewed.  Constitutional:      General: He is not in acute distress.    Appearance: He is not ill-appearing or toxic-appearing.  Cardiovascular:     Rate and Rhythm: Normal rate and regular rhythm.     Heart sounds: No murmur heard. Pulmonary:     Effort: Pulmonary effort is normal. No respiratory distress.     Breath sounds: No wheezing or rales.  Musculoskeletal:     Right lower leg: No edema.     Left lower leg: No edema.  Neurological:     Mental Status: He is alert.  Psychiatric:        Mood and Affect: Mood normal.        Behavior: Behavior normal.     Labs / Other Information My review of labs, imaging, notes and other tests is significant for stable Hgb     Time spent: 25 minutes [facility] 01/07/2021, 6:29 PM

## 2021-01-07 NOTE — Hospital Course (Addendum)
60 year old man PMH including ESRD not on dialysis no, in  hospice presented with fall resulting in right hip pain.  Admitted for right hip fracture --10/11 s/p right THA

## 2021-01-08 LAB — SURGICAL PATHOLOGY

## 2021-01-08 MED ORDER — OXYCODONE-ACETAMINOPHEN 5-325 MG PO TABS: 1.0000 | ORAL_TABLET | ORAL | 0 refills | Status: AC | PRN

## 2021-01-08 MED ORDER — FE FUMARATE-B12-VIT C-FA-IFC PO CAPS
1.0000 | ORAL_CAPSULE | Freq: Two times a day (BID) | ORAL | Status: DC
  Administered 2021-01-08 – 2021-01-10 (×5): 1 via ORAL
  Filled 2021-01-08 (×6): qty 1

## 2021-01-08 MED ORDER — DOCUSATE SODIUM 100 MG PO CAPS: 100.0000 mg | ORAL_CAPSULE | Freq: Two times a day (BID) | ORAL | 0 refills | Status: AC

## 2021-01-08 NOTE — Progress Notes (Signed)
Occupational Therapy Treatment Patient Details Name: Geoffrey Rangel MRN: [id number] DOB: [birth year] Today's Date: 01/08/2021   History of present illness 60 y/o male who fell while taking the last pieces of funtinure out of his home before moving.  He suffered a R hip fx and is now s/p total hip replacement (anterior approach) 10/11.   OT comments  Pt. Reports 9/10 right hip pain. Pt. education was provided about A/E use for LE ADLs. Pt. Was able to demonstrate reacher, and sockaide use following visual demonstration while seated unsupported at the EOB. Pt. Reports that his daughter will be bringing in lounge pants, or sweat pants for him. Pt. education was provided about home set-up, routines, and anticipated assist from family. Pt. Continues to benefit from OT services for ADL training, A/E training, and pt. education about home modification, and DME. Pt. Plans to return home upon discharge with family to assist pt. as needed. Pt. Could benefit from follow-up [facility] services upon discharge.    Recommendations for follow up therapy are one component of a multi-disciplinary discharge planning process, led by the attending physician.  Recommendations may be updated based on patient status, additional functional criteria and insurance authorization.    Follow Up Recommendations  Home health OT    Equipment Recommendations  3 in 1 bedside commode;Tub/shower seat    Recommendations for Other Services      Precautions / Restrictions Precautions Precautions: Anterior Hip;Fall Restrictions Weight Bearing Restrictions: Yes RLE Weight Bearing: Weight bearing as tolerated       Mobility Bed Mobility Overal bed mobility: Modified Independent             General bed mobility comments: Increased time to perform supine to sit to the EOB. Assist was required for the tube/line    Transfers               Balance Overall balance assessment: Needs assistance Sitting-balance support: No  upper extremity supported Sitting balance-Leahy Scale: Good     Standing balance support: Bilateral upper extremity supported Standing balance-Leahy Scale: Good                             ADL either performed or assessed with clinical judgement   ADL Overall ADL's : Needs assistance/impaired                                             Vision       Perception     Praxis      Cognition Arousal/Alertness: Awake/alert Behavior During Therapy: WFL for tasks assessed/performed Overall Cognitive Status: Within Functional Limits for tasks assessed                                          Exercises     Shoulder Instructions       General Comments      Pertinent Vitals/ Pain       Pain Assessment: 0-10 Pain Score: 9  Pain Location: Anterior Hip Pain Descriptors / Indicators: Aching Pain Intervention(s): Premedicated before session;Limited activity within patient's tolerance;Monitored during session  Home Living  Prior Functioning/Environment              Frequency  Min 2X/week        Progress Toward Goals  OT Goals(current goals can now be found in the care plan section)  Progress towards OT goals: Progressing toward goals  Acute Rehab OT Goals Patient Stated Goal: To go home OT Goal Formulation: With patient Time For Goal Achievement: 01/21/21 Potential to Achieve Goals: Good  Plan      Co-evaluation                 AM-PAC OT "6 Clicks" Daily Activity     Outcome Measure   Help from another person eating meals?: None Help from another person taking care of personal grooming?: A Little Help from another person toileting, which includes using toliet, bedpan, or urinal?: A Little Help from another person bathing (including washing, rinsing, drying)?: A Little Help from another person to put on and taking off regular upper body  clothing?: None Help from another person to put on and taking off regular lower body clothing?: A Little 6 Click Score: 20    End of Session Equipment Utilized During Treatment: Gait belt;Rolling walker  OT Visit Diagnosis: Muscle weakness (generalized) (M62.81);Pain Pain - Right/Left: Right Pain - part of body: Hip   Activity Tolerance Patient tolerated treatment well   Patient Left in chair;with call bell/phone within reach;with chair alarm set   Nurse Communication Mobility status        Time: [id number] OT Time Calculation (min): 22 min  Charges: OT General Charges $OT Visit: 1 Visit OT Treatments $Self Care/Home Management : 8-22 mins  Harrel Carina, MS, OTR/L   Harrel Carina 01/08/2021, 4:18 PM

## 2021-01-08 NOTE — Progress Notes (Signed)
Physical Therapy Treatment Patient Details Name: Geoffrey Rangel MRN: [id number] DOB: [birth year] Today's Date: 01/08/2021   History of Present Illness 60 y/o male who fell while taking the last pieces of funtinure out of his home before moving.  He suffered a R hip fx and is now s/p total hip replacement (anterior approach) 10/11.    PT Comments    Despite considerable pain (and having just worked with OT) pt was pleasant and willing to do PT.  He continues to have guarded, slow gait.  He had typically hesitation initially but was able to slowly increase speed/cadence while trying to decrease UE reliance.  This lasted for ~30 ft, but then he had to slow back down and rely more heavily on UEs/walker as he had increased pain.  Pt was able to rise from low bed height with plenty of cuing and set up, however he struggled to be able to control descent back down to low height.  Educated on proper UE use and positioning to no avail and ultimately PT did raise bed to ease transition back to sitting. CGA with getting LEs back up into bed, he used his UEs and some momentum but did not need actual direct assist.  Recommendations for follow up therapy are one component of a multi-disciplinary discharge planning process, led by the attending physician.  Recommendations may be updated based on patient status, additional functional criteria and insurance authorization.  Follow Up Recommendations  Home health PT;Supervision - Intermittent     Equipment Recommendations  Rolling walker with 5" wheels;3in1 (PT)    Recommendations for Other Services       Precautions / Restrictions Precautions Precautions: Anterior Hip;Fall Restrictions Weight Bearing Restrictions: Yes RLE Weight Bearing: Weight bearing as tolerated     Mobility  Bed Mobility Overal bed mobility: Modified Independent             General bed mobility comments: sitting EOB with OT on arrival, needed UEs to assist RLE back into bed  post session, but able to laboriously do so w/o direct assist.    Transfers Overall transfer level: Modified independent Equipment used: Rolling walker (2 wheeled) Transfers: Sit to/from Stand Sit to Stand: Min guard         General transfer comment: heavy UE reliance and forward lean but able to rise w/o elevating bed or any direct assist  Ambulation/Gait Ambulation/Gait assistance: Min guard Gait Distance (Feet): 100 Feet Assistive device: Rolling walker (2 wheeled)       General Gait Details: Slow but steady gait with heavy UE reliance.  Some hesitancy with full WBing on R LE but no buckling or stagger stepping, and subjectively he reports trying to use it more and UEs less.   Stairs             Wheelchair Mobility    Modified Rankin (Stroke Patients Only)       Balance Overall balance assessment: Needs assistance Sitting-balance support: No upper extremity supported Sitting balance-Leahy Scale: Good     Standing balance support: Bilateral upper extremity supported Standing balance-Leahy Scale: Good                              Cognition Arousal/Alertness: Awake/alert Behavior During Therapy: WFL for tasks assessed/performed Overall Cognitive Status: Within Functional Limits for tasks assessed  Exercises      General Comments General comments (skin integrity, edema, etc.): Pt clearly very sore, great effort but limited due to pain      Pertinent Vitals/Pain Pain Assessment: 0-10 Pain Score: 9  Pain Location: Anterior Hip Pain Descriptors / Indicators: Aching Pain Intervention(s): Premedicated before session;Limited activity within patient's tolerance;Monitored during session    Home Living                      Prior Function            PT Goals (current goals can now be found in the care plan section) Progress towards PT goals: Progressing toward goals     Frequency    BID      PT Plan Current plan remains appropriate    Co-evaluation              AM-PAC PT "6 Clicks" Mobility   Outcome Measure  Help needed turning from your back to your side while in a flat bed without using bedrails?: None Help needed moving from lying on your back to sitting on the side of a flat bed without using bedrails?: None Help needed moving to and from a bed to a chair (including a wheelchair)?: A Little Help needed standing up from a chair using your arms (e.g., wheelchair or bedside chair)?: A Little Help needed to walk in hospital room?: A Little Help needed climbing 3-5 steps with a railing? : A Little 6 Click Score: 20    End of Session Equipment Utilized During Treatment: Gait belt Activity Tolerance: Patient limited by pain Patient left: with call bell/phone within reach;with bed alarm set;with nursing/sitter in room Nurse Communication: Mobility status PT Visit Diagnosis: Muscle weakness (generalized) (M62.81);Difficulty in walking, not elsewhere classified (R26.2);Pain Pain - Right/Left: Right Pain - part of body: Hip     Time: [id number] PT Time Calculation (min) (ACUTE ONLY): 25 min  Charges:  $Gait Training: 8-22 mins $Therapeutic Activity: 8-22 mins                     Kreg Shropshire, DPT 01/08/2021, 4:45 PM

## 2021-01-08 NOTE — Assessment & Plan Note (Addendum)
--   Discussed with orthopedics, stable for discharge, requested full-strength aspirin for DVT prophylaxis, pain medication prescribed. -- Outpatient management as per orthopedics.  Patient will discharge back home with hospice, hospital bed, rolling walker.  3 and 1 and bedside commode ordered.  Patient able to ambulate to the nursing station into the bathroom with assistance. -- F/u in 2 weeks at [facility] for staple removal. Patient to remove Prevena on 01/15/21 and replace with Honeycomb dressing.

## 2021-01-08 NOTE — Discharge Instructions (Signed)

## 2021-01-08 NOTE — Progress Notes (Signed)
[facility]) Hospitalized Hospice Patient Visit Geoffrey Rangel is a current hospice patient with a terminal diagnosis of hypertensive chronic kidney disease with stage 5 chronic kidney disease or end stage renal disease. Notified by patient's wife that patient had fallen and was c/o right hip pain and that he could not walk. Instructed wife to call EMS for transport to ED. Notified patient was admitted for right femoral neck fracture. Per Dr. Gilford Rile with [facility], this is a related hospital admission.    Visited patient in room. He is in bed, he is drowsy as he has received pain medication not too long before. He reports that he walked up to the nurses station and was able to climb a couple of steps in the PT room. Patient continues to be adamant that he will go home at discharge and is not interested in rehab.      Patient remains inpatient appropriate due to right femoral neck fracture with surgical repair. At this time is still requiring IV pain medication and acute PT services   V/S: T 97.7, HR 88, RR 16, BP 142/80, spO2 100% room air I/O: 940/3250  Abnormal Labs: None new  Diagnostics: None new   IV/PRN:  Percocet 5/325 q4 hours x2, Morphine 2 mg IV every 4 hours PRN x 2, Robaxin '500mg'$ x1   Problem List: Principal Problem:   Closed right hip fracture (HCC) Active Problems:   Hyperlipidemia   End stage renal disease ([place])   Fall at home, initial encounter   Hypertension   Tobacco abuse   Anemia in ESRD (end-stage renal disease) ([place])   Leukocytosis   Discharge Planning: Ongoing. Patient wants to go home and currently this is therapy's recommendation   Family Contact: SW spoke with wife   IDT: Updated   Goals of Care: DNR, treat the treatable.   Thank you,  Jhonnie Garner, BSN, RN, [facility] hospital liaison [phone]

## 2021-01-08 NOTE — Progress Notes (Signed)
  Progress Note    Geoffrey Rangel   [id number]  DOB: 1961-03-25  DOA: 01/06/2021     2 Date of Service: 01/08/2021  60 year old man PMH including ESRD not on dialysis no, in  hospice presented with fall resulting in right hip pain.  Admitted for right hip fracture --10/11 s/p right THA       Assessment and Plan * Closed right hip fracture ([place]) -- Management per orthopedics, postop day 2 -- Home with hospice planned 10/14.  Patient moving into new place, currently has no bed in house  Fall at home, initial encounter --home PT  End stage renal disease ([place]) --continue hospice, still makes urine  Anemia in ESRD (end-stage renal disease) ([place]) --stable  Subjective:  Feels ok, some pain  Objective Vitals:   01/08/21 0414 01/08/21 0756 01/08/21 1125 01/08/21 1509  BP: 128/67 140/75 (!) 142/80 (!) 148/67  Pulse: 92 88 88 95  Resp: '16 16 16 14  '$ Temp: 98.7 F (37.1 C) 97.9 F (36.6 C) 97.7 F (36.5 C) 98 F (36.7 C)  TempSrc:  Oral    SpO2: 97% 98% 100% 98%  Weight:      Height:       78.4 kg  Vital signs were reviewed and unremarkable.  Exam Physical Exam Vitals reviewed.  Constitutional:      General: He is not in acute distress.    Appearance: He is not ill-appearing or toxic-appearing.  Cardiovascular:     Rate and Rhythm: Normal rate and regular rhythm.     Heart sounds: No murmur heard. Pulmonary:     Effort: Pulmonary effort is normal. No respiratory distress.     Breath sounds: No wheezing or rales.  Musculoskeletal:     Right lower leg: No edema.     Left lower leg: No edema.  Psychiatric:        Mood and Affect: Mood normal.        Behavior: Behavior normal.    Labs / Other Information There are no new results to review at this time.  Disposition Plan: Status is: Inpatient  Remains inpatient appropriate because: unsafe discharge Home tomorrow  Time spent: 15 minutes [facility] 01/08/2021, 4:54 PM

## 2021-01-08 NOTE — Assessment & Plan Note (Addendum)
--  home with hospice

## 2021-01-08 NOTE — Progress Notes (Signed)
Physical Therapy Treatment Patient Details Name: Geoffrey Rangel MRN: [id number] DOB: 22-Feb-1961 Today's Date: 01/08/2021   History of Present Illness 60 y/o male who fell while taking the last pieces of funtinure out of his home before moving.  He suffered a R hip fx and is now s/p total hip replacement (anterior approach) 10/11.    PT Comments    Pt continues to make consistent improvements with strength, mobility and ambulation.  He was able to negotiate up/down steps (expected UE reliance) w/o assist.  Apart from continued significant pain pt is doing well, hope to be able to fully circumambulate the nurses' station this afternoon.     Recommendations for follow up therapy are one component of a multi-disciplinary discharge planning process, led by the attending physician.  Recommendations may be updated based on patient status, additional functional criteria and insurance authorization.  Follow Up Recommendations  Home health PT;Supervision - Intermittent     Equipment Recommendations  Rolling walker with 5" wheels;3in1 (PT)    Recommendations for Other Services       Precautions / Restrictions Precautions Precautions: Anterior Hip;Fall Restrictions RLE Weight Bearing: Weight bearing as tolerated     Mobility  Bed Mobility Overal bed mobility: Modified Independent             General bed mobility comments: again slow to get LEs out/in bed but able to do so w/o direct assit    Transfers Overall transfer level: Modified independent Equipment used: Rolling walker (2 wheeled) Transfers: Sit to/from Stand Sit to Stand: Min guard         General transfer comment: heavy UE reliance and forward lean but able to rise w/o elevating bed or any direct assist  Ambulation/Gait Ambulation/Gait assistance: Min guard Gait Distance (Feet): 125 Feet Assistive device: Rolling walker (2 wheeled)       General Gait Details: Slow but steady gait with heavy UE reliance.  Some  hesitancy with fully trusting R LE but no buckling or stagger stepping, etc.  Pt's O2 remained in the low 90s and HR 90-110 t/o the effort.  Despite slow cadence and some minimal guarding he showed great effort and relative safety with this first prolonged bout of ambulation.   Stairs Stairs: Yes Stairs assistance: Supervision Stair Management: Two rails;Forwards Number of Stairs: 4 General stair comments: Pt was able to negotiate up/down steps w/o assist.  Cuing for appropriate sequencing and strategy.   Wheelchair Mobility    Modified Rankin (Stroke Patients Only)       Balance   Sitting-balance support: No upper extremity supported Sitting balance-Leahy Scale: Good     Standing balance support: Bilateral upper extremity supported Standing balance-Leahy Scale: Good                              Cognition Arousal/Alertness: Awake/alert Behavior During Therapy: WFL for tasks assessed/performed Overall Cognitive Status: Within Functional Limits for tasks assessed                                        Exercises Total Joint Exercises Ankle Circles/Pumps: AROM;10 reps Quad Sets: Strengthening;15 reps Short Arc Quad: Strengthening;10 reps Heel Slides: Strengthening;10 reps;AROM (resisted leg ext) Hip ABduction/ADduction: Strengthening;10 reps Straight Leg Raises:  (unable)    General Comments        Pertinent Vitals/Pain Pain Assessment: 0-10  Pain Score: 8  Pain Location: anterior hip    Home Living                      Prior Function            PT Goals (current goals can now be found in the care plan section) Progress towards PT goals: Progressing toward goals    Frequency    BID      PT Plan Current plan remains appropriate    Co-evaluation              AM-PAC PT "6 Clicks" Mobility   Outcome Measure  Help needed turning from your back to your side while in a flat bed without using bedrails?:  None Help needed moving from lying on your back to sitting on the side of a flat bed without using bedrails?: A Little Help needed moving to and from a bed to a chair (including a wheelchair)?: A Little Help needed standing up from a chair using your arms (e.g., wheelchair or bedside chair)?: A Little Help needed to walk in hospital room?: A Little Help needed climbing 3-5 steps with a railing? : A Lot 6 Click Score: 18    End of Session Equipment Utilized During Treatment: Gait belt Activity Tolerance: Patient limited by pain;Patient tolerated treatment well Patient left: with bed alarm set;with call bell/phone within reach Nurse Communication: Mobility status PT Visit Diagnosis: Muscle weakness (generalized) (M62.81);Difficulty in walking, not elsewhere classified (R26.2);Pain Pain - Right/Left: Right Pain - part of body: Hip     Time: 0830-0910 PT Time Calculation (min) (ACUTE ONLY): 40 min  Charges:  $Gait Training: 23-37 mins $Therapeutic Exercise: 8-22 mins                     Kreg Shropshire, DPT 01/08/2021, 12:06 PM

## 2021-01-08 NOTE — Assessment & Plan Note (Addendum)
--   Acute on chronic anemia, probably multifactorial including surgery, illness, uremia, mild epistaxis -- Hemoglobin appropriately increased status post 2 units PRBC.  No further epistaxis.  No further inpatient evaluation suggested.

## 2021-01-08 NOTE — Progress Notes (Signed)
   Subjective: 2 Days Post-Op Procedure(s) (LRB): TOTAL HIP ARTHROPLASTY ANTERIOR APPROACH (Right) Patient reports pain as moderate.   Patient is well, and has had no acute complaints or problems Denies any CP, SOB, ABD pain. We will continue with physical therapy today.   Objective: Vital signs in last 24 hours: Temp:  [97.9 F (36.6 C)-98.8 F (37.1 C)] 97.9 F (36.6 C) (10/13 0756) Pulse Rate:  [84-92] 88 (10/13 0756) Resp:  [16-19] 16 (10/13 0756) BP: (128-154)/(65-75) 140/75 (10/13 0756) SpO2:  [97 %-100 %] 98 % (10/13 0756)  Intake/Output from previous day: 10/12 0701 - 10/13 0700 In: 360 [P.O.:360] Out: 1525 [Urine:1525] Intake/Output this shift: Total I/O In: -  Out: 250 [Urine:250]  Recent Labs    01/06/21 0646 01/06/21 1904 01/07/21 0619  HGB 8.5* 8.1* 7.9*   Recent Labs    01/06/21 0646 01/06/21 0839 01/06/21 1904 01/07/21 0619  WBC 14.1*  --   --  12.6*  RBC 2.87* 2.95*  --  2.60*  HCT 26.2*  --  24.5* 24.1*  PLT 244  --   --  231   Recent Labs    01/06/21 0646 01/07/21 0619  NA 139 136  K 4.4 4.9  CL 110 105  CO2 12* 17*  BUN 106* 109*  CREATININE 10.51* 10.32*  GLUCOSE 107* 143*  CALCIUM 8.2* 8.3*   Recent Labs    01/06/21 0646  INR 1.2    EXAM General - Patient is Alert, Appropriate, and Oriented Extremity - Neurovascular intact Sensation intact distally Intact pulses distally Dorsiflexion/Plantar flexion intact Incision: dressing C/D/I and no drainage No cellulitis present Compartment soft Dressing - dressing C/D/I and no drainage, Praveena intact without drainage Motor Function - intact, moving foot and toes well on exam.   Past Medical History:  Diagnosis Date   Arthritis    Chronic kidney disease    Dyspnea    occasional   GERD (gastroesophageal reflux disease)    Hyperlipidemia    Hypertension    Renal disorder     Assessment/Plan:   2 Days Post-Op Procedure(s) (LRB): TOTAL HIP ARTHROPLASTY ANTERIOR  APPROACH (Right) Principal Problem:   Closed right hip fracture (HCC) Active Problems:   Hyperlipidemia   End stage renal disease (HCC)   Fall at home, initial encounter   Hypertension   Tobacco abuse   Anemia in ESRD (end-stage renal disease) (HCC)   Leukocytosis   Malnutrition of moderate degree  Estimated body mass index is 22.81 kg/m as calculated from the following:   Height as of this encounter: '6\' 1"'$  (1.854 m).   Weight as of this encounter: 78.4 kg. Advance diet Up with therapy Vital signs are stable Acute on chronic anemia -hemoglobin 8.5, trending up.  Continue to monitor. Pain controlled Care manager to assist with discharge to home with home health PT   Patient will need 2-week follow-up with orthopedics. TED hose bilateral lower extremities x6 weeks Please remove provena negative pressure dressing on 01/15/2021 and apply honey comb dressing. Keep dressing clean and dry at all times.   DVT Prophylaxis - Foot Pumps, TED hose, and heparin Weight-Bearing as tolerated to right leg   T. Rachelle Hora, PA-C [place] 01/08/2021, 9:52 AM

## 2021-01-08 NOTE — Assessment & Plan Note (Addendum)
--  continue hospice, still makes urine; discussed with nephrology, peritoneal dialysis has been suggested by the team, patient will consider this as an outpatient

## 2021-01-09 DIAGNOSIS — R1110 Vomiting, unspecified: Secondary | ICD-10-CM

## 2021-01-09 DIAGNOSIS — R040 Epistaxis: Secondary | ICD-10-CM

## 2021-01-09 LAB — CBC
HCT: 18.3 % — ABNORMAL LOW (ref 39.0–52.0)
Hemoglobin: 6.1 g/dL — ABNORMAL LOW (ref 13.0–17.0)
MCH: 31.1 pg (ref 26.0–34.0)
MCHC: 33.3 g/dL (ref 30.0–36.0)
MCV: 93.4 fL (ref 80.0–100.0)
Platelets: 175 10*3/uL (ref 150–400)
RBC: 1.96 MIL/uL — ABNORMAL LOW (ref 4.22–5.81)
RDW: 14.4 % (ref 11.5–15.5)
WBC: 9.5 10*3/uL (ref 4.0–10.5)
nRBC: 0 % (ref 0.0–0.2)

## 2021-01-09 LAB — TYPE AND SCREEN
ABO/RH(D): O POS
Antibody Screen: NEGATIVE

## 2021-01-09 LAB — PREPARE RBC (CROSSMATCH)

## 2021-01-09 MED ORDER — EPOETIN ALFA 10000 UNIT/ML IJ SOLN
10000.0000 [IU] | Freq: Once | INTRAMUSCULAR | Status: AC
  Administered 2021-01-09: 10000 [IU] via SUBCUTANEOUS
  Filled 2021-01-09 (×2): qty 1

## 2021-01-09 MED ORDER — SODIUM CHLORIDE 0.9% IV SOLUTION: Freq: Once | INTRAVENOUS | Status: AC

## 2021-01-09 MED ORDER — CHLORHEXIDINE GLUCONATE CLOTH 2 % EX PADS
6.0000 | MEDICATED_PAD | Freq: Every day | CUTANEOUS | Status: DC
  Administered 2021-01-09 – 2021-01-10 (×2): 6 via TOPICAL

## 2021-01-09 NOTE — Assessment & Plan Note (Addendum)
--   Probably secondary to uremia, resolved.

## 2021-01-09 NOTE — Progress Notes (Signed)
[facility]) Hospitalized Hospice Patient Visit   Geoffrey Rangel is a current hospice patient with a terminal diagnosis of hypertensive chronic kidney disease with stage 5 chronic kidney disease or end stage renal disease. Notified by patient's wife that patient had fallen and was c/o right hip pain and that he could not walk. Instructed wife to call EMS for transport to ED. Notified patient was admitted for right femoral neck fracture. Per Dr. Gilford Rile with [facility], this is a related hospital admission.    Visited patient at bedside. Wife Geoffrey Rangel present. Denies needs that are not being met by hospital care team. Current plan is for patient to return home with hospice services on 10.14.22. Hospital bed has been ordered at patient request. Report exchanged with hospital care team.    Patient remains inpatient appropriate due to surgery s/p right femoral neck fracture.   V/S: 98.2, 141/67, 99, 15, 98% RA sats      I/O: 400/1250   Abnormal Labs:  CO2: 17 (L) Glucose: 143 (H) BUN: 109 (H) Creatinine: 10.32 (H) Calcium: 8.3 (L) GFR, Estimated: 5 (L) RBC: 1.96 (L) Hemoglobin: 6.1 (L) HCT: 18.3 (L)  Diagnostics: None   IV/PRN:  Morphine 2 mg IV every 4 hours PRN x 2   Problem List: Closed right hip fracture (HCC) -- Management per orthopedics, postop day 2 -- Home with hospice planned 10/14.  Patient moving into new place, currently has no bed in house   Fall at home, initial encounter --home PT   End stage renal disease ([place]) --continue hospice, still makes urine   Anemia in ESRD (end-stage renal disease) ([place]) --stable   Discharge Planning: Ongoing. Plan to discharge home with hospice on 10.15.22   Family Contact: Spoke with wife Geoffrey Rangel at bedside.   IDT: Updated   Goals of Care: DNR, treat the treatable.   Please do not hesitate to call with any hospice related questions or concerns.    Thank you,    Bobbie "Loren Racer, [place], BSN [facility] Liaison [phone]

## 2021-01-09 NOTE — Progress Notes (Signed)
PT Cancellation Note  Patient Details Name: THEODORA REGIS MRN: [id number] DOB: May 10, 1960   Cancelled Treatment:    Reason Eval/Treat Not Completed: Pain limiting ability to participate. Pt reporting 10/10 pain requesting to place afternoon PT session on hold. Despite encouragement to attempt Hep packet, pt still denying Pt this p.m. Pt educated once pain under control to amb with nursing staff and perform HEP once pain better controlled. Pt verbalized understanding. Will re-attempt tomorrow morning.   Salem Caster. Fairly IV, PT, DPT Physical Therapist- [facility]  01/09/2021, 2:47 PM

## 2021-01-09 NOTE — Progress Notes (Signed)
Physical Therapy Treatment Patient Details Name: Geoffrey Rangel MRN: [id number] DOB: 04/01/1960 Today's Date: 01/09/2021   History of Present Illness 60 y/o male who fell while taking the last pieces of funtinure out of his home before moving.  He suffered a R hip fx and is now s/p total hip replacement (anterior approach) 10/11.    PT Comments    Pt was long sitting in bed upon arriving. He agrees to session and is cooperative and motivated throughout. He was able to exit R side of bed, stand, and ambulate to rehab gym. Safely performed ascending/descending stairs. Pt has support spouse at home who will assist if needed at DC. Recommend HHPT to continue to progress pt to PLOF.   Recommendations for follow up therapy are one component of a multi-disciplinary discharge planning process, led by the attending physician.  Recommendations may be updated based on patient status, additional functional criteria and insurance authorization.  Follow Up Recommendations  Home health PT;Supervision - Intermittent     Equipment Recommendations  Rolling walker with 5" wheels;3in1 (PT)    Recommendations for Other Services       Precautions / Restrictions Precautions Precautions: Anterior Hip;Fall Precaution Booklet Issued: Yes (comment) Restrictions Weight Bearing Restrictions: Yes RLE Weight Bearing: Weight bearing as tolerated     Mobility  Bed Mobility Overal bed mobility: Modified Independent             General bed mobility comments: heavy use of UEs and oopsite LE to assist operative LE    Transfers Overall transfer level: Modified independent Equipment used: Rolling walker (2 wheeled) Transfers: Sit to/from Stand Sit to Stand: Modified independent (Device/Increase time)            Ambulation/Gait Ambulation/Gait assistance: Supervision Gait Distance (Feet): 200 Feet Assistive device: Rolling walker (2 wheeled)   Gait velocity: decreased   General Gait Details: Pt  was able to ambulate to rehab gym and tolerate performing stairs with supervision only   Stairs Stairs: Yes Stairs assistance: Supervision Stair Management: Two rails;Forwards Number of Stairs: 4 General stair comments: Pt was able to negotiate up/down steps w/o assist.  Cuing for appropriate sequencing and strategy.   Wheelchair Mobility    Modified Rankin (Stroke Patients Only)       Balance   Sitting-balance support: No upper extremity supported Sitting balance-Leahy Scale: Good     Standing balance support: Bilateral upper extremity supported Standing balance-Leahy Scale: Good                              Cognition Arousal/Alertness: Awake/alert Behavior During Therapy: WFL for tasks assessed/performed Overall Cognitive Status: Within Functional Limits for tasks assessed                                 General Comments: Pt A and O x 4      Exercises      General Comments        Pertinent Vitals/Pain Pain Assessment: 0-10 Pain Score: 7  Pain Location: Anterior Hip Pain Descriptors / Indicators: Aching Pain Intervention(s): Limited activity within patient's tolerance;Monitored during session;Premedicated before session;Repositioned    Home Living                      Prior Function            PT Goals (current goals can  now be found in the care plan section) Acute Rehab PT Goals Patient Stated Goal: Walk with less pain Progress towards PT goals: Progressing toward goals    Frequency    BID      PT Plan Current plan remains appropriate    Co-evaluation              AM-PAC PT "6 Clicks" Mobility   Outcome Measure  Help needed turning from your back to your side while in a flat bed without using bedrails?: None Help needed moving from lying on your back to sitting on the side of a flat bed without using bedrails?: None Help needed moving to and from a bed to a chair (including a wheelchair)?: A  Little Help needed standing up from a chair using your arms (e.g., wheelchair or bedside chair)?: A Little Help needed to walk in hospital room?: A Little Help needed climbing 3-5 steps with a railing? : A Little 6 Click Score: 20    End of Session Equipment Utilized During Treatment: Gait belt Activity Tolerance: Patient tolerated treatment well Patient left: with call bell/phone within reach;with bed alarm set;with nursing/sitter in room Nurse Communication: Mobility status PT Visit Diagnosis: Muscle weakness (generalized) (M62.81);Difficulty in walking, not elsewhere classified (R26.2);Pain Pain - Right/Left: Right Pain - part of body: Hip     Time: [id number] PT Time Calculation (min) (ACUTE ONLY): 42 min  Charges:  $Gait Training: 23-37 mins $Therapeutic Activity: 8-22 mins                     Julaine Fusi PTA 01/09/21, 3:23 PM

## 2021-01-09 NOTE — Progress Notes (Signed)
  Progress Note  Geoffrey Rangel   [id number]  DOB: Dec 31, 1960  DOA: 01/06/2021     3 Date of Service: 01/09/2021   Clinical Course 60 year old man PMH including ESRD not on dialysis, in hospice, presented with fall resulting in right hip pain.  Admitted for right hip fracture --10/11 s/p right THA  --10/12 stable, plan for home w/ hospice, has no bed there yet, plan for home 10/14 --10/13 stable --10/14 vomiting this morning, epistaxis, requiring transfusion, monitor today, transfuse reevaluate in a.m.   Assessment and Plan * Closed right hip fracture ([place]) -- Management per orthopedics -- Home with hospice was planned for today but given vomiting and epistaxis as well as significant anemia, will treat as outlined and reevaluate in the morning.  Fall at home, initial encounter --home PT  End stage renal disease (Geoffrey Rangel) --continue hospice, still makes urine; discussed with nephrology, peritoneal dialysis has been suggested by the team, patient will consider this as an outpatient  Vomiting -- Probably secondary to uremia.  Zofran as needed.  Plan to discharge on Zofran as well.  Eating a hamburger for lunch.  Epistaxis -- Probably secondary to uremia, appears to be low volume.  Monitor.  We will stop heparin.  Doubt played a significant role inanemia.  Anemia in ESRD (end-stage renal disease) (HCC) -- Acute on chronic anemia, probably multifactorial including surgery, illness, uremia, mild epistaxis -- Discussed with patient.  Plan transfusion 2 units PRBC  Subjective:  Vomited this morning, currently eating lunch.  Consider outpatient peritoneal dialysis.  Mild epistaxis this morning.  Still having some oozing from nose.  Objective Vitals:   01/09/21 0805 01/09/21 1205 01/09/21 1510 01/09/21 1510  BP: (!) 141/67 125/62 115/60 115/60  Pulse: 99 97  96  Resp: '15 15  16  '$ Temp: 98.2 F (36.8 C) 98.5 F (36.9 C)  99.2 F (37.3 C)  TempSrc: Oral   Oral  SpO2: 98% 98%  98%   Weight:      Height:       78.4 kg  Vital signs were reviewed and unremarkable.   Exam Physical Exam Vitals reviewed.  Constitutional:      General: He is not in acute distress.    Appearance: He is not ill-appearing.  HENT:     Nose:     Comments: Tissue in right nare, no gross hemorrhage Cardiovascular:     Rate and Rhythm: Normal rate and regular rhythm.     Heart sounds: No murmur heard. Pulmonary:     Effort: Pulmonary effort is normal. No respiratory distress.     Breath sounds: No wheezing or rales.  Neurological:     Mental Status: He is alert.  Psychiatric:        Mood and Affect: Mood normal.        Behavior: Behavior normal.    Labs / Other Information My review of labs, imaging, notes and other tests is significant for     Hemoglobin 6.1  Disposition Plan: Status is: Inpatient  Remains inpatient appropriate because: Significant anemia today requiring transfusion and monitoring, vomiting this morning  Time spent: 20 minutes [facility] 01/09/2021, 3:22 PM

## 2021-01-09 NOTE — Progress Notes (Addendum)
[facility]  ROUNDING NOTE   Subjective:   Geoffrey Rangel is a 60 y.o. male with past medical history of hypertension, hyperlipidemia, GERD, anemia, BPH, and end stage renal disease stopped dialysis Nov 2021. Patient presents to ED after fall and right hip pain.   Patient is known to our clinic and received dialysis at [facility], supervised by Dr Candiss Norse. He decided to stop dialysis last year stating he was tired of getting stuck with needles.   Patient resting in bed States they plan to discharge today, home with therapy Tolerating meals Denies shortness of breath Patient seen later with wife at bedside Wife states patient has a lot of vomiting with meals and without. Also states patient sleeps most of day   Objective:  Vital signs in last 24 hours:  Temp:  [98 F (36.7 C)-98.5 F (36.9 C)] 98.2 F (36.8 C) (10/14 0805) Pulse Rate:  [87-100] 99 (10/14 0805) Resp:  [14-18] 15 (10/14 0805) BP: (127-148)/(67-74) 141/67 (10/14 0805) SpO2:  [95 %-98 %] 98 % (10/14 0805)  Weight change:  Filed Weights   01/06/21 0506  Weight: 78.4 kg    Intake/Output: I/O last 3 completed shifts: In: 400 [I.V.:400] Out: 2500 [Urine:2500]   Intake/Output this shift:  Total I/O In: 240 [P.O.:240] Out: -   Physical Exam: General: NAD, resting In bed  Head: Normocephalic, atraumatic. Moist oral mucosal membranes  Eyes: Anicteric  Lungs:  Clear to auscultation, normal effort  Heart: Regular rate and rhythm  Abdomen:  Soft, nontender  Extremities:  no peripheral edema.  Neurologic: Nonfocal, moving all four extremities  Skin: No lesions, Rt hip dressing (NPWV)  Access: Rt AVF    Basic Metabolic Panel: Recent Labs  Lab 01/06/21 0646 01/07/21 0619  NA 139 136  K 4.4 4.9  CL 110 105  CO2 12* 17*  GLUCOSE 107* 143*  BUN 106* 109*  CREATININE 10.51* 10.32*  CALCIUM 8.2* 8.3*  PHOS 9.7*  --      Liver Function Tests: No results for input(s): AST, ALT,  ALKPHOS, BILITOT, PROT, ALBUMIN in the last 168 hours. No results for input(s): LIPASE, AMYLASE in the last 168 hours. No results for input(s): AMMONIA in the last 168 hours.  CBC: Recent Labs  Lab 01/06/21 0646 01/06/21 1904 01/07/21 0619 01/09/21 1013  WBC 14.1*  --  12.6* 9.5  NEUTROABS 12.4*  --   --   --   HGB 8.5* 8.1* 7.9* 6.1*  HCT 26.2* 24.5* 24.1* 18.3*  MCV 91.3  --  92.7 93.4  PLT 244  --  231 175     Cardiac Enzymes: No results for input(s): CKTOTAL, CKMB, CKMBINDEX, TROPONINI in the last 168 hours.  BNP: Invalid input(s): POCBNP  CBG: No results for input(s): GLUCAP in the last 168 hours.  Microbiology: Results for orders placed or performed during the hospital encounter of 01/06/21  Resp Panel by RT-PCR (Flu A&B, Covid) Nasopharyngeal Swab     Status: None   Collection Time: 01/06/21  6:46 AM   Specimen: Nasopharyngeal Swab; Nasopharyngeal(NP) swabs in vial transport medium  Result Value Ref Range Status   SARS Coronavirus 2 by RT PCR NEGATIVE NEGATIVE Final    Comment: (NOTE) SARS-CoV-2 target nucleic acids are NOT DETECTED.  The SARS-CoV-2 RNA is generally detectable in upper respiratory specimens during the acute phase of infection. The lowest concentration of SARS-CoV-2 viral copies this assay can detect is 138 copies/mL. A negative result does not preclude SARS-Cov-2 infection and  should not be used as the sole basis for treatment or other patient management decisions. A negative result may occur with  improper specimen collection/handling, submission of specimen other than nasopharyngeal swab, presence of viral mutation(s) within the areas targeted by this assay, and inadequate number of viral copies(<138 copies/mL). A negative result must be combined with clinical observations, patient history, and epidemiological information. The expected result is Negative.  Fact Sheet for Patients:  EntrepreneurPulse.com.au  Fact Sheet  for Healthcare Providers:  IncredibleEmployment.be  This test is no t yet approved or cleared by the [place] FDA and  has been authorized for detection and/or diagnosis of SARS-CoV-2 by FDA under an Emergency Use Authorization (EUA). This EUA will remain  in effect (meaning this test can be used) for the duration of the COVID-19 declaration under Section 564(b)(1) of the Act, 21 U.S.C.section 360bbb-3(b)(1), unless the authorization is terminated  or revoked sooner.       Influenza A by PCR NEGATIVE NEGATIVE Final   Influenza B by PCR NEGATIVE NEGATIVE Final    Comment: (NOTE) The Xpert Xpress SARS-CoV-2/FLU/RSV plus assay is intended as an aid in the diagnosis of influenza from Nasopharyngeal swab specimens and should not be used as a sole basis for treatment. Nasal washings and aspirates are unacceptable for Xpert Xpress SARS-CoV-2/FLU/RSV testing.  Fact Sheet for Patients: EntrepreneurPulse.com.au  Fact Sheet for Healthcare Providers: IncredibleEmployment.be  This test is not yet approved or cleared by the [place] FDA and has been authorized for detection and/or diagnosis of SARS-CoV-2 by FDA under an Emergency Use Authorization (EUA). This EUA will remain in effect (meaning this test can be used) for the duration of the COVID-19 declaration under Section 564(b)(1) of the Act, 21 U.S.C. section 360bbb-3(b)(1), unless the authorization is terminated or revoked.  Performed at [facility], [address] [place], [place] [address]     Coagulation Studies: No results for input(s): LABPROT, INR in the last 72 hours.   Urinalysis: No results for input(s): COLORURINE, LABSPEC, PHURINE, GLUCOSEU, HGBUR, BILIRUBINUR, KETONESUR, PROTEINUR, UROBILINOGEN, NITRITE, LEUKOCYTESUR in the last 72 hours.  Invalid input(s): APPERANCEUR    Imaging: No results found.   Medications:    sodium chloride  50 mL/hr at 01/08/21 2100   promethazine (PHENERGAN) injection (IM or IVPB)      amLODipine  2.5 mg Oral Daily   docusate sodium  100 mg Oral BID   ferrous [id number] C-folic acid  1 capsule Oral BID PC   gabapentin  100 mg Oral TID   isosorbide mononitrate  30 mg Oral Daily   nicotine  21 mg Transdermal Daily   rosuvastatin  5 mg Oral Daily   tamsulosin  0.4 mg Oral Daily   acetaminophen, albuterol, alum & mag hydroxide-simeth, bisacodyl, dextromethorphan-guaiFENesin, diphenhydrAMINE, hydrALAZINE, menthol-cetylpyridinium **OR** phenol, methocarbamol, morphine injection, nitroGLYCERIN, ondansetron (ZOFRAN) IV, ondansetron **OR** ondansetron (ZOFRAN) IV, oxyCODONE-acetaminophen, promethazine (PHENERGAN) injection (IM or IVPB), senna-docusate  Assessment/ Plan:  [name] is a 60 y.o.  male with past medical history of hypertension, hyperlipidemia, GERD, anemia, BPH, and end stage renal disease stopped dialysis Nov 2021. Patient has been admitted for Closed right hip fracture ([place]) [S72.001A] Fall, initial encounter [W19.XXXA] Closed fracture of right hip, initial encounter ([place]) [S72.001A]  End stage renal disease: Stopped dialysis in Nov 2021. Followed by Hospice outpatient.  Discussed uremic symptoms with patient and wife, drowsiness and vomiting.  Interested in speaking with PD educator about possibly starting this form of dialysis. Discussed PD with  patient and wife extensively.  They understand that if they proceed with PD, they will have to discontinue hospice services.  They will discuss with family and decide if they would like to proceed.   Anemia of chronic kidney disease Lab Results  Component Value Date   HGB 6.1 (L) 01/09/2021  Hgb below target.  Will defer need for transfusion to primary team  3.Secondary Hyperparathyroidism:  Lab Results  Component Value Date   PTH 227 (H) 06/08/2018   CALCIUM 8.3 (L) 01/07/2021   PHOS 9.7 (H) 01/06/2021    Calcium and  phosphorus not at goal  4. Hypertension with chronic renal disease BP at goal at 125/62 Currently on Amlodipine and isosorbide monomitrate.  Continue current treatment  5. Closed right hip fracture Appreciate orthopedics performing a right anterior total hip arthroplasty yesterday.  Patient will be discharged home with PT/OT   LOS: 3 [place] 10/14/202211:59 AM

## 2021-01-09 NOTE — Hospital Course (Addendum)
60 year old man PMH including ESRD not on dialysis, in hospice, presented with fall resulting in right hip pain.  Admitted for right hip fracture --10/11 s/p right THA  --10/12 stable, plan for home w/ hospice, has no bed there yet, plan for home 10/14 --10/13 stable --10/14 vomiting this morning, epistaxis, requiring transfusion, monitor today, transfuse reevaluate in a.m. --10/15 vomiting and epistaxis resolved.  Hemoglobin up appropriately with transfusion.  Feeling okay.  Plan discharge today, did well with therapy.  Was able to ambulate with assistance quite far.  Per nursing able to get to the bathroom and to nursing station with assistance.

## 2021-01-09 NOTE — Assessment & Plan Note (Addendum)
--   Probably secondary to uremia.  Resolved.  Discharged with Zofran.

## 2021-01-09 NOTE — Progress Notes (Signed)
   Subjective: 3 Days Post-Op Procedure(s) (LRB): TOTAL HIP ARTHROPLASTY ANTERIOR APPROACH (Right) Patient reports pain as mild and moderate.   Patient is well, and has had no acute complaints or problems Denies any CP, SOB, ABD pain. We will continue with physical therapy today.   Objective: Vital signs in last 24 hours: Temp:  [97.7 F (36.5 C)-98.5 F (36.9 C)] 98.2 F (36.8 C) (10/14 0805) Pulse Rate:  [87-100] 99 (10/14 0805) Resp:  [14-18] 15 (10/14 0805) BP: (127-148)/(67-80) 141/67 (10/14 0805) SpO2:  [95 %-100 %] 98 % (10/14 0805)  Intake/Output from previous day: 10/13 0701 - 10/14 0700 In: 400 [I.V.:400] Out: 1250 [Urine:1250] Intake/Output this shift: No intake/output data recorded.  Recent Labs    01/06/21 1904 01/07/21 0619  HGB 8.1* 7.9*   Recent Labs    01/06/21 0839 01/06/21 1904 01/07/21 0619  WBC  --   --  12.6*  RBC 2.95*  --  2.60*  HCT  --  24.5* 24.1*  PLT  --   --  231   Recent Labs    01/07/21 0619  NA 136  K 4.9  CL 105  CO2 17*  BUN 109*  CREATININE 10.32*  GLUCOSE 143*  CALCIUM 8.3*   No results for input(s): LABPT, INR in the last 72 hours.   EXAM General - Patient is Alert, Appropriate, and Oriented Extremity - Neurovascular intact Sensation intact distally Intact pulses distally Dorsiflexion/Plantar flexion intact Incision: dressing C/D/I and no drainage No cellulitis present Compartment soft Dressing - dressing C/D/I and no drainage, Praveena intact without drainage Motor Function - intact, moving foot and toes well on exam.   Past Medical History:  Diagnosis Date   Arthritis    Chronic kidney disease    Dyspnea    occasional   GERD (gastroesophageal reflux disease)    Hyperlipidemia    Hypertension    Renal disorder     Assessment/Plan:   3 Days Post-Op Procedure(s) (LRB): TOTAL HIP ARTHROPLASTY ANTERIOR APPROACH (Right) Principal Problem:   Closed right hip fracture (HCC) Active Problems:    Hyperlipidemia   End stage renal disease (HCC)   Fall at home, initial encounter   Hypertension   Tobacco abuse   Anemia in ESRD (end-stage renal disease) (HCC)   Leukocytosis   Malnutrition of moderate degree  Estimated body mass index is 22.81 kg/m as calculated from the following:   Height as of this encounter: '6\' 1"'$  (1.854 m).   Weight as of this encounter: 78.4 kg. Advance diet Up with therapy Vital signs are stable Acute on chronic anemia - check cbc this am Pain controlled Care manager to assist with discharge to home with home health PT   Patient will need 2-week follow-up with orthopedics. TED hose bilateral lower extremities x6 weeks Please remove provena negative pressure dressing on 01/15/2021 and apply honey comb dressing. Keep dressing clean and dry at all times.   DVT Prophylaxis - Foot Pumps, TED hose, and heparin Weight-Bearing as tolerated to right leg   T. Rachelle Hora, PA-C [place] 01/09/2021, 8:13 AM

## 2021-01-09 NOTE — Plan of Care (Signed)
Pt having difficulty with pain control overnight despite use of prn medications. Wound vac maintained to suction without output overnight. Pt had a nose bleed with scant blood loss <3cc, right nostril packed with gauze. Urine output adequate. Pt is denying other needs at this time.   Problem: Pain Managment: Goal: General experience of comfort will improve Outcome: Progressing   Problem: Health Behavior/Discharge Planning: Goal: Ability to manage health-related needs will improve Outcome: Progressing   Problem: Activity: Goal: Risk for activity intolerance will decrease Outcome: Progressing

## 2021-01-09 NOTE — TOC Progression Note (Addendum)
Transition of Care [facility]) - Progression Note    Patient Details  Name: Geoffrey Rangel MRN: [id number] Date of Birth: 1961-01-11  Transition of Care [facility]) CM/SW [place], RN Phone Number: 01/09/2021, 9:46 AM  Clinical Narrative:   Met with the patient in the room, he stated that he may have to get a hospital bed set up, I requested him to let me know if he needs one as soon as possible in order for it to get set up, H stated he had a rough night, He is having a nose bleed and throwing up, I notified the physician  Update, the patient's wife came to the room, they requested that [place] come back and talk about the hospital bed, they determined that they will need a hospital bed, I explained that with him being active with hospice that they would set it up, I called [facility] and notified them that the patient needs a hospital bed. Sonia Baller with [facility] is getting it set up       Expected Discharge Plan and Services                                                 Social Determinants of Health (SDOH) Interventions    Readmission Risk Interventions No flowsheet data found.

## 2021-01-10 ENCOUNTER — Encounter: Payer: Self-pay | Admitting: Internal Medicine

## 2021-01-10 DIAGNOSIS — I700 Atherosclerosis of aorta: Secondary | ICD-10-CM

## 2021-01-10 LAB — BPAM RBC
Blood Product Expiration Date: 202211122359
Blood Product Expiration Date: 202211122359
ISSUE DATE / TIME: 202210141501
ISSUE DATE / TIME: 202210141932
Unit Type and Rh: 5100
Unit Type and Rh: 5100

## 2021-01-10 LAB — TYPE AND SCREEN
ABO/RH(D): O POS
Antibody Screen: NEGATIVE
Unit division: 0
Unit division: 0

## 2021-01-10 LAB — CBC
HCT: 23.4 % — ABNORMAL LOW (ref 39.0–52.0)
Hemoglobin: 8 g/dL — ABNORMAL LOW (ref 13.0–17.0)
MCH: 31.1 pg (ref 26.0–34.0)
MCHC: 34.2 g/dL (ref 30.0–36.0)
MCV: 91.1 fL (ref 80.0–100.0)
Platelets: 178 10*3/uL (ref 150–400)
RBC: 2.57 MIL/uL — ABNORMAL LOW (ref 4.22–5.81)
RDW: 14.2 % (ref 11.5–15.5)
WBC: 9.7 10*3/uL (ref 4.0–10.5)
nRBC: 0 % (ref 0.0–0.2)

## 2021-01-10 LAB — IRON AND TIBC
Iron: 27 ug/dL — ABNORMAL LOW (ref 45–182)
Saturation Ratios: 14 % — ABNORMAL LOW (ref 17.9–39.5)
TIBC: 196 ug/dL — ABNORMAL LOW (ref 250–450)
UIBC: 169 ug/dL

## 2021-01-10 LAB — FERRITIN: Ferritin: 103 ng/mL (ref 24–336)

## 2021-01-10 LAB — TRANSFERRIN: Transferrin: 154 mg/dL — ABNORMAL LOW (ref 180–329)

## 2021-01-10 MED ORDER — FE FUMARATE-B12-VIT C-FA-IFC PO CAPS: 1.0000 | ORAL_CAPSULE | Freq: Two times a day (BID) | ORAL | 2 refills | Status: AC

## 2021-01-10 MED ORDER — ONDANSETRON HCL 4 MG PO TABS: 4.0000 mg | ORAL_TABLET | Freq: Four times a day (QID) | ORAL | 0 refills | Status: AC | PRN

## 2021-01-10 MED ORDER — ASPIRIN 325 MG PO TABS
325.0000 mg | ORAL_TABLET | Freq: Every day | ORAL | Status: DC
  Administered 2021-01-10: 325 mg via ORAL
  Filled 2021-01-10: qty 1

## 2021-01-10 MED ORDER — ASPIRIN 325 MG PO TABS: 325.0000 mg | ORAL_TABLET | Freq: Every day | ORAL | 0 refills | Status: AC

## 2021-01-10 NOTE — Progress Notes (Signed)
Horris Latino at [facility] was made aware of the patient's discharge for today. I also requested for her to order a walker and three-in-one bedside commode. Per patient's wife, hospital bed was already delivered.

## 2021-01-10 NOTE — Plan of Care (Signed)
  Problem: Education: Goal: Knowledge of General Education information will improve Description: Including pain rating scale, medication(s)/side effects and non-pharmacologic comfort measures Outcome: Adequate for Discharge   Problem: Health Behavior/Discharge Planning: Goal: Ability to manage health-related needs will improve Outcome: Adequate for Discharge   Problem: Clinical Measurements: Goal: Will remain free from infection Outcome: Adequate for Discharge   Problem: Activity: Goal: Risk for activity intolerance will decrease Outcome: Adequate for Discharge   Problem: Nutrition: Goal: Adequate nutrition will be maintained Outcome: Adequate for Discharge   Problem: Pain Managment: Goal: General experience of comfort will improve Outcome: Adequate for Discharge   Problem: Safety: Goal: Ability to remain free from injury will improve Outcome: Adequate for Discharge

## 2021-01-10 NOTE — Progress Notes (Addendum)
  Subjective: 4 Days Post-Op Procedure(s) (LRB): TOTAL HIP ARTHROPLASTY ANTERIOR APPROACH (Right) Patient reports pain as moderate, and severe with any weightbearing to the RLE. Patient is well, and has had no acute complaints or problems Plan is to go Home after hospital stay. Negative for chest pain and shortness of breath Fever: no Gastrointestinal: negative for nausea and vomiting this AM.   Objective: Vital signs in last 24 hours: Temp:  [98 F (36.7 C)-99.6 F (37.6 C)] 98.8 F (37.1 C) (10/15 0733) Pulse Rate:  [91-101] 92 (10/15 0733) Resp:  [15-17] 17 (10/15 0733) BP: (115-154)/(59-74) 133/74 (10/15 0733) SpO2:  [96 %-98 %] 98 % (10/15 0733)  Intake/Output from previous day:  Intake/Output Summary (Last 24 hours) at 01/10/2021 1010 Last data filed at 01/10/2021 0900 Gross per 24 hour  Intake 2372 ml  Output 1000 ml  Net 1372 ml    Intake/Output this shift: Total I/O In: 350 [P.O.:350] Out: 0   Labs: Recent Labs    01/09/21 1013 01/10/21 0429  HGB 6.1* 8.0*   Recent Labs    01/09/21 1013 01/10/21 0429  WBC 9.5 9.7  RBC 1.96* 2.57*  HCT 18.3* 23.4*  PLT 175 178   No results for input(s): NA, K, CL, CO2, BUN, CREATININE, GLUCOSE, CALCIUM in the last 72 hours. No results for input(s): LABPT, INR in the last 72 hours.   EXAM General - Patient is Alert, Appropriate, and Oriented Extremity - Neurovascular intact Dorsiflexion/Plantar flexion intact Compartment soft; patient is exquisitely tender to palpation around the distal aspect of the incision and distal thigh, nontender below the knee Dressing/Incision -Prevena in place, no drainage noted in cannister  Motor Function - intact, moving foot and toes well on exam.      Assessment/Plan: 4 Days Post-Op Procedure(s) (LRB): TOTAL HIP ARTHROPLASTY ANTERIOR APPROACH (Right) Principal Problem:   Closed right hip fracture (HCC) Active Problems:   Hyperlipidemia   End stage renal disease ([place])   Fall  at home, initial encounter   Hypertension   Tobacco abuse   Anemia in ESRD (end-stage renal disease) (HCC)   Leukocytosis   Malnutrition of moderate degree   Epistaxis   Vomiting  Estimated body mass index is 22.81 kg/m as calculated from the following:   Height as of this encounter: '6\' 1"'$  (1.854 m).   Weight as of this encounter: 78.4 kg. Advance diet Up with therapy, maintain anterior hip precautions  Plan is for d/c to home pending medical clearance.    Heparin was d/c'd yesterday per medicine d/2 epistaxis.  Labs reviewed, Hg is 8.0 this AM after 2 units were transfused.  F/u in 2 weeks at [facility] for staple removal. Patient to remove Prevena on 01/15/21 and replace with Honeycomb dressing. Portable Prevena attached this AM in preparation for d/c.    Addendum: spoke with medicine regarding DVT prophylaxis, they are agreeable to ASA. Patient will d/c with ASA 325 mg qd  DVT Prophylaxis - Ted hose and SCDs, ASA '325mg'$  Weight-Bearing as tolerated to right leg  Cassell Smiles, PA-C [facility] 01/10/2021, 10:10 AM

## 2021-01-10 NOTE — Discharge Summary (Signed)
Physician Discharge Summary   Patient name: Geoffrey Rangel  Admit date:     01/06/2021  Discharge date: 01/10/2021  Discharge Physician: Murray Hodgkins   PCP: Patient, No Pcp Per (Inactive)   Recommendations at discharge:  Hip fracture ESRD, consider peritoneal dialysis  Discharge Diagnoses Principal Problem:   Closed right hip fracture [facility]) Active Problems:   Hyperlipidemia   Hypertension   Tobacco abuse   Leukocytosis   Malnutrition of moderate degree   Fall at home, initial encounter   End stage renal disease ([place])   Epistaxis   Vomiting   Anemia in ESRD (end-stage renal disease) [facility])  Hospital Course   60 year old man PMH including ESRD not on dialysis, in hospice, presented with fall resulting in right hip pain.  Admitted for right hip fracture --10/11 s/p right THA  --10/12 stable, plan for home w/ hospice, has no bed there yet, plan for home 10/14 --10/13 stable --10/14 vomiting this morning, epistaxis, requiring transfusion, monitor today, transfuse reevaluate in a.m. --10/15 vomiting and epistaxis resolved.  Hemoglobin up appropriately with transfusion.  Feeling okay.  Plan discharge today, did well with therapy.  Was able to ambulate with assistance quite far.  Per nursing able to get to the bathroom and to nursing station with assistance.    * Closed right hip fracture ([place]) -- Discussed with orthopedics, stable for discharge, requested full-strength aspirin for DVT prophylaxis, pain medication prescribed. -- Outpatient management as per orthopedics.  Patient will discharge back home with hospice, hospital bed, rolling walker.  3 and 1 and bedside commode ordered.  Patient able to ambulate to the nursing station into the bathroom with assistance. -- F/u in 2 weeks at [facility] for staple removal. Patient to remove Prevena on 01/15/21 and replace with Honeycomb dressing.   Fall at home, initial encounter --home with hospice  End stage renal disease  [facility]) --continue hospice, still makes urine; discussed with nephrology, peritoneal dialysis has been suggested by the team, patient will consider this as an outpatient  Vomiting -- Probably secondary to uremia.  Resolved.  Discharged with Zofran.  Epistaxis -- Probably secondary to uremia, resolved.  Anemia in ESRD (end-stage renal disease) (H[facility]) -- Acute on chronic anemia, probably multifactorial including surgery, illness, uremia, mild epistaxis -- Hemoglobin appropriately increased status post 2 units PRBC.  No further epistaxis.  No further inpatient evaluation suggested.   Procedures performed:  TOTAL HIP ARTHROPLASTY ANTERIOR APPROACH (Right)   Condition at discharge: good  Exam Physical Exam Constitutional:      General: He is not in acute distress.    Appearance: He is not ill-appearing or toxic-appearing.  HENT:     Nose:     Comments: No bleeding noted Cardiovascular:     Rate and Rhythm: Normal rate and regular rhythm.     Heart sounds: No murmur heard. Pulmonary:     Effort: Pulmonary effort is normal. No respiratory distress.     Breath sounds: No wheezing or rales.  Neurological:     Mental Status: He is alert.  Psychiatric:        Mood and Affect: Mood normal.        Behavior: Behavior normal.    Disposition:  Home with hospice  Discharge time: greater than 30 minutes.  Follow-up Information     Duanne Guess, PA-C Follow up.   Specialties: Orthopedic Surgery, Emergency Medicine Contact information: [place] [address] [phone]  Allergies as of 01/10/2021       Reactions   Other         Medication List     STOP taking these medications    haloperidol 0.5 MG tablet Commonly known as: HALDOL   oxyCODONE 5 MG immediate release tablet Commonly known as: Oxy IR/ROXICODONE       TAKE these medications    amLODipine 2.5 MG tablet Commonly known as: NORVASC Take 2.5 mg by mouth  daily.   aspirin 325 MG tablet Take 1 tablet (325 mg total) by mouth daily.   docusate sodium 100 MG capsule Commonly known as: COLACE Take 1 capsule (100 mg total) by mouth 2 (two) times daily.   ferrous Q000111Q C-folic acid capsule Commonly known as: TRINSICON / FOLTRIN Take 1 capsule by mouth 2 (two) times daily after a meal.   gabapentin 100 MG capsule Commonly known as: NEURONTIN Take 100 mg by mouth 3 (three) times daily.   isosorbide mononitrate 30 MG 24 hr tablet Commonly known as: IMDUR Take 1 tablet (30 mg total) by mouth daily.   nitroGLYCERIN 0.4 MG SL tablet Commonly known as: NITROSTAT Place 1 tablet (0.4 mg total) under the tongue every 5 (five) minutes as needed for chest pain.   ondansetron 4 MG tablet Commonly known as: ZOFRAN Take 1 tablet (4 mg total) by mouth every 6 (six) hours as needed for nausea.   oxyCODONE-acetaminophen 5-325 MG tablet Commonly known as: PERCOCET/ROXICET Take 1 tablet by mouth every 4 (four) hours as needed for moderate pain.   rosuvastatin 5 MG tablet Commonly known as: CRESTOR Take 5 mg by mouth daily.   tamsulosin 0.4 MG Caps capsule Commonly known as: FLOMAX Take 0.4 mg by mouth daily.               Durable Medical Equipment  (From admission, onward)           Start     Ordered   01/06/21 2012  DME Walker rolling  Once       Question Answer Comment  Walker: With [place] Wheels   Patient needs a walker to treat with the following condition Status post total hip replacement, right      01/06/21 2011   01/06/21 2012  DME 3 n 1  Once        01/06/21 2011   01/06/21 2012  DME Bedside commode  Once       Question:  Patient needs a bedside commode to treat with the following condition  Answer:  Status post total hip replacement, right   01/06/21 2011              Discharge Care Instructions  (From admission, onward)           Start     Ordered   01/10/21 0000  Discharge wound care:        Comments: Remove Prevena on 01/15/21 and replace with Honeycomb dressing.   01/10/21 1251            DG Shoulder Right  Result Date: 01/06/2021 CLINICAL DATA:  [place]. Right shoulder pain. EXAM: RIGHT SHOULDER - 2+ VIEW COMPARISON:  None. FINDINGS: Mild glenohumeral joint degenerative changes but no fracture or dislocation. The visualized right ribs are intact and the visualized right lung is clear. IMPRESSION: No fracture or dislocation. Electronically Signed   By: Marijo Sanes M.D.   On: 01/06/2021 05:48   CT PELVIS WO CONTRAST  Result Date: 01/06/2021  CLINICAL DATA:  [place]. Abnormal radiographs. EXAM: CT PELVIS WITHOUT CONTRAST TECHNIQUE: Multidetector CT imaging of the pelvis was performed following the standard protocol without intravenous contrast. COMPARISON:  Radiographs, same date. FINDINGS: There is a mildly impacted basicervical neck fracture of the right hip. Both hips are normally located. Moderate degenerative changes bilaterally. No evidence of AVN. The pubic symphysis and SI joints are intact. No pelvic fractures or bone lesions. Incidental partial sacralization of L5. Grossly by CT hip and pelvic musculature are unremarkable. No obvious muscle tear or intramuscular hematoma. No significant intrapelvic abnormalities are identified. There are advanced atherosclerotic calcifications involving the aorta and iliac arteries and branch vessels. IMPRESSION: 1. Mildly impacted basicervical neck fracture of the right hip. 2. Moderate degenerative changes bilaterally. 3. No pelvic fractures or bone lesions. 4. Advanced atherosclerotic calcifications involving the aorta and iliac arteries and branch vessels. Aortic Atherosclerosis (ICD10-I70.0). Electronically Signed   By: Marijo Sanes M.D.   On: 01/06/2021 06:22   DG Chest Portable 1 View  Result Date: 01/06/2021 CLINICAL DATA:  Preoperative chest x-ray. Right hip fracture. EXAM: PORTABLE CHEST 1 VIEW COMPARISON:  06/08/2018  FINDINGS: The cardiac silhouette, mediastinal and hilar contours are within normal limits. There is mild tortuosity and calcification of the thoracic aorta. The lungs are clear of an acute process. No pulmonary lesions or pleural effusions. The bony thorax is intact. IMPRESSION: No acute cardiopulmonary findings. Electronically Signed   By: Marijo Sanes M.D.   On: 01/06/2021 06:46   DG HIP OPERATIVE UNILAT W OR W/O PELVIS RIGHT  Result Date: 01/06/2021 CLINICAL DATA:  Hip replacement EXAM: OPERATIVE right HIP (WITH PELVIS IF PERFORMED) 4 VIEWS TECHNIQUE: Fluoroscopic spot image(s) were submitted for interpretation post-operatively. COMPARISON:  CT and radiograph 01/06/2021 FINDINGS: Intraoperative images during right hip arthroplasty for a femoral neck fracture. Intact hardware without evidence of loosening or fracture. Normal alignment. IMPRESSION: Intraoperative images during right hip arthroplasty. No evidence of immediate hardware complication. Electronically Signed   By: Maurine Simmering M.D.   On: 01/06/2021 20:47   DG HIP UNILAT W OR W/O PELVIS 2-3 VIEWS RIGHT  Result Date: 01/06/2021 CLINICAL DATA:  Right hip arthroplasty, postoperative pain EXAM: DG HIP (WITH OR WITHOUT PELVIS) 2-3V RIGHT COMPARISON:  01/06/2021 FINDINGS: Frontal view of the pelvis and a cross-table lateral view of the right hip are obtained. The iliac crests are excluded by collimation. Right hip arthroplasty is identified in the expected position without signs of acute complication. There are no acute displaced fractures. Postsurgical changes are seen in the soft tissues overlying the right hip. There is extensive atherosclerosis. IMPRESSION: 1. Unremarkable right hip arthroplasty. Electronically Signed   By: Randa Ngo M.D.   On: 01/06/2021 19:59   DG Hip Unilat W or Wo Pelvis 2-3 Views Right  Result Date: 01/06/2021 CLINICAL DATA:  [place]. Right hip pain. EXAM: DG HIP (WITH OR WITHOUT PELVIS) 2-3V RIGHT COMPARISON:  None.  FINDINGS: Both hips are normally located. Mild degenerative changes. Suspect a right femoral neck fracture with slight impaction/shortening. CT may be helpful for further evaluation. The pubic symphysis and SI joints are intact. No definite pelvic fractures. Significant age advanced vascular calcifications. IMPRESSION: Suspect right femoral neck fracture. CT may be helpful for further evaluation. Electronically Signed   By: Marijo Sanes M.D.   On: 01/06/2021 05:51   Results for orders placed or performed during the hospital encounter of 01/06/21  Resp Panel by RT-PCR (Flu A&B, Covid) Nasopharyngeal Swab     Status: None  Collection Time: 01/06/21  6:46 AM   Specimen: Nasopharyngeal Swab; Nasopharyngeal(NP) swabs in vial transport medium  Result Value Ref Range Status   SARS Coronavirus 2 by RT PCR NEGATIVE NEGATIVE Final    Comment: (NOTE) SARS-CoV-2 target nucleic acids are NOT DETECTED.  The SARS-CoV-2 RNA is generally detectable in upper respiratory specimens during the acute phase of infection. The lowest concentration of SARS-CoV-2 viral copies this assay can detect is 138 copies/mL. A negative result does not preclude SARS-Cov-2 infection and should not be used as the sole basis for treatment or other patient management decisions. A negative result may occur with  improper specimen collection/handling, submission of specimen other than nasopharyngeal swab, presence of viral mutation(s) within the areas targeted by this assay, and inadequate number of viral copies(<138 copies/mL). A negative result must be combined with clinical observations, patient history, and epidemiological information. The expected result is Negative.  Fact Sheet for Patients:  EntrepreneurPulse.com.au  Fact Sheet for Healthcare Providers:  IncredibleEmployment.be  This test is no t yet approved or cleared by the [place] FDA and  has been authorized for detection  and/or diagnosis of SARS-CoV-2 by FDA under an Emergency Use Authorization (EUA). This EUA will remain  in effect (meaning this test can be used) for the duration of the COVID-19 declaration under Section 564(b)(1) of the Act, 21 U.S.C.section 360bbb-3(b)(1), unless the authorization is terminated  or revoked sooner.       Influenza A by PCR NEGATIVE NEGATIVE Final   Influenza B by PCR NEGATIVE NEGATIVE Final    Comment: (NOTE) The Xpert Xpress SARS-CoV-2/FLU/RSV plus assay is intended as an aid in the diagnosis of influenza from Nasopharyngeal swab specimens and should not be used as a sole basis for treatment. Nasal washings and aspirates are unacceptable for Xpert Xpress SARS-CoV-2/FLU/RSV testing.  Fact Sheet for Patients: EntrepreneurPulse.com.au  Fact Sheet for Healthcare Providers: IncredibleEmployment.be  This test is not yet approved or cleared by the [place] FDA and has been authorized for detection and/or diagnosis of SARS-CoV-2 by FDA under an Emergency Use Authorization (EUA). This EUA will remain in effect (meaning this test can be used) for the duration of the COVID-19 declaration under Section 564(b)(1) of the Act, 21 U.S.C. section 360bbb-3(b)(1), unless the authorization is terminated or revoked.  Performed at [facility], [address] [place], [place] [address]     Signed:  Murray Hodgkins MD.  [facility] 01/10/2021, 3:24 PM

## 2021-01-10 NOTE — Progress Notes (Signed)
I have reached out to [facility], [place], waiting on a call back.

## 2021-01-10 NOTE — Progress Notes (Signed)
PT Cancellation Note  Patient Details Name: Geoffrey Rangel MRN: [id number] DOB: 12-18-1960   Cancelled Treatment:     PT attempt. Pt requested Pryor Curia return later this morning. Will continue to follow and progress as able per current POC.   Willette Pa 01/10/2021, 9:55 AM

## 2021-01-10 NOTE — Progress Notes (Signed)
Discharge instructions were reviewed with the patient and his wife. They had no further questions. Hospice is aware that the patient is being discharged today.

## 2021-01-10 NOTE — Progress Notes (Signed)
[facility]  ROUNDING NOTE   Subjective:   Geoffrey Rangel is a 60 y.o. male with past medical history of hypertension, hyperlipidemia, GERD, anemia, BPH, and end stage renal disease stopped dialysis Nov 2021. Patient presents to ED after fall and right hip pain.   Patient is known to our clinic and received dialysis at [facility], supervised by Dr Candiss Norse. He decided to stop dialysis last year stating he was tired of getting stuck with needles.   Patient seen laying in bed Breakfast at bedside, poor appetite Complains of mild nausea this morning denies vomiting Patient seen later in the morning working with PT  Objective:  Vital signs in last 24 hours:  Temp:  [98 F (36.7 C)-99.6 F (37.6 C)] 98.8 F (37.1 C) (10/15 0733) Pulse Rate:  [91-101] 92 (10/15 0733) Resp:  [16-17] 17 (10/15 0733) BP: (115-154)/(59-74) 133/74 (10/15 0733) SpO2:  [96 %-98 %] 98 % (10/15 0733)  Weight change:  Filed Weights   01/06/21 0506  Weight: 78.4 kg    Intake/Output: I/O last 3 completed shifts: In: 2422 [P.O.:720; I.V.:500; Blood:1202] Out: 1200 [Urine:1200]   Intake/Output this shift:  Total I/O In: 350 [P.O.:350] Out: 0   Physical Exam: General: NAD, resting In bed  Head: Normocephalic, atraumatic. Moist oral mucosal membranes  Eyes: Anicteric  Lungs:  Clear to auscultation, normal effort  Heart: Regular rate and rhythm  Abdomen:  Soft, nontender  Extremities:  no peripheral edema.  Neurologic: Nonfocal, moving all four extremities  Skin: No lesions, Rt hip dressing (NPWV)  Access: Rt AVF    Basic Metabolic Panel: Recent Labs  Lab 01/06/21 0646 01/07/21 0619  NA 139 136  K 4.4 4.9  CL 110 105  CO2 12* 17*  GLUCOSE 107* 143*  BUN 106* 109*  CREATININE 10.51* 10.32*  CALCIUM 8.2* 8.3*  PHOS 9.7*  --      Liver Function Tests: No results for input(s): AST, ALT, ALKPHOS, BILITOT, PROT, ALBUMIN in the last 168 hours. No results for input(s): LIPASE,  AMYLASE in the last 168 hours. No results for input(s): AMMONIA in the last 168 hours.  CBC: Recent Labs  Lab 01/06/21 0646 01/06/21 1904 01/07/21 0619 01/09/21 1013 01/10/21 0429  WBC 14.1*  --  12.6* 9.5 9.7  NEUTROABS 12.4*  --   --   --   --   HGB 8.5* 8.1* 7.9* 6.1* 8.0*  HCT 26.2* 24.5* 24.1* 18.3* 23.4*  MCV 91.3  --  92.7 93.4 91.1  PLT 244  --  231 175 178     Cardiac Enzymes: No results for input(s): CKTOTAL, CKMB, CKMBINDEX, TROPONINI in the last 168 hours.  BNP: Invalid input(s): POCBNP  CBG: No results for input(s): GLUCAP in the last 168 hours.  Microbiology: Results for orders placed or performed during the hospital encounter of 01/06/21  Resp Panel by RT-PCR (Flu A&B, Covid) Nasopharyngeal Swab     Status: None   Collection Time: 01/06/21  6:46 AM   Specimen: Nasopharyngeal Swab; Nasopharyngeal(NP) swabs in vial transport medium  Result Value Ref Range Status   SARS Coronavirus 2 by RT PCR NEGATIVE NEGATIVE Final    Comment: (NOTE) SARS-CoV-2 target nucleic acids are NOT DETECTED.  The SARS-CoV-2 RNA is generally detectable in upper respiratory specimens during the acute phase of infection. The lowest concentration of SARS-CoV-2 viral copies this assay can detect is 138 copies/mL. A negative result does not preclude SARS-Cov-2 infection and should not be used as the sole  basis for treatment or other patient management decisions. A negative result may occur with  improper specimen collection/handling, submission of specimen other than nasopharyngeal swab, presence of viral mutation(s) within the areas targeted by this assay, and inadequate number of viral copies(<138 copies/mL). A negative result must be combined with clinical observations, patient history, and epidemiological information. The expected result is Negative.  Fact Sheet for Patients:  EntrepreneurPulse.com.au  Fact Sheet for Healthcare Providers:   IncredibleEmployment.be  This test is no t yet approved or cleared by the [place] FDA and  has been authorized for detection and/or diagnosis of SARS-CoV-2 by FDA under an Emergency Use Authorization (EUA). This EUA will remain  in effect (meaning this test can be used) for the duration of the COVID-19 declaration under Section 564(b)(1) of the Act, 21 U.S.C.section 360bbb-3(b)(1), unless the authorization is terminated  or revoked sooner.       Influenza A by PCR NEGATIVE NEGATIVE Final   Influenza B by PCR NEGATIVE NEGATIVE Final    Comment: (NOTE) The Xpert Xpress SARS-CoV-2/FLU/RSV plus assay is intended as an aid in the diagnosis of influenza from Nasopharyngeal swab specimens and should not be used as a sole basis for treatment. Nasal washings and aspirates are unacceptable for Xpert Xpress SARS-CoV-2/FLU/RSV testing.  Fact Sheet for Patients: EntrepreneurPulse.com.au  Fact Sheet for Healthcare Providers: IncredibleEmployment.be  This test is not yet approved or cleared by the [place] FDA and has been authorized for detection and/or diagnosis of SARS-CoV-2 by FDA under an Emergency Use Authorization (EUA). This EUA will remain in effect (meaning this test can be used) for the duration of the COVID-19 declaration under Section 564(b)(1) of the Act, 21 U.S.C. section 360bbb-3(b)(1), unless the authorization is terminated or revoked.  Performed at [facility], [address] [place], [place] [address]     Coagulation Studies: No results for input(s): LABPROT, INR in the last 72 hours.   Urinalysis: No results for input(s): COLORURINE, LABSPEC, PHURINE, GLUCOSEU, HGBUR, BILIRUBINUR, KETONESUR, PROTEINUR, UROBILINOGEN, NITRITE, LEUKOCYTESUR in the last 72 hours.  Invalid input(s): APPERANCEUR    Imaging: No results found.   Medications:    promethazine (PHENERGAN) injection (IM or  IVPB)      amLODipine  2.5 mg Oral Daily   aspirin  325 mg Oral Daily   Chlorhexidine Gluconate Cloth  6 each Topical Daily   docusate sodium  100 mg Oral BID   ferrous Q000111Q C-folic acid  1 capsule Oral BID PC   gabapentin  100 mg Oral TID   isosorbide mononitrate  30 mg Oral Daily   nicotine  21 mg Transdermal Daily   rosuvastatin  5 mg Oral Daily   tamsulosin  0.4 mg Oral Daily   acetaminophen, albuterol, alum & mag hydroxide-simeth, bisacodyl, dextromethorphan-guaiFENesin, diphenhydrAMINE, hydrALAZINE, menthol-cetylpyridinium **OR** phenol, methocarbamol, morphine injection, nitroGLYCERIN, ondansetron **OR** ondansetron (ZOFRAN) IV, oxyCODONE-acetaminophen, promethazine (PHENERGAN) injection (IM or IVPB), senna-docusate  Assessment/ Plan:  [name] is a 60 y.o.  male with past medical history of hypertension, hyperlipidemia, GERD, anemia, BPH, and end stage renal disease stopped dialysis Nov 2021. Patient has been admitted for Closed right hip fracture ([place]) [S72.001A] Fall, initial encounter [W19.XXXA] Closed fracture of right hip, initial encounter ([place]) [S72.001A]  End stage renal disease: Stopped dialysis in Nov 2021. Followed by Hospice outpatient.  Continues to complain of morning nausea, possibly related to elevated BUN.  Continue supportive care with Zofran.  Continues to refuse dialysis reinitiation, but has stated possible interest  in peritoneal dialysis.  Patient and wife understand that if they pursue PD, they will no longer qualify for hospice care.  Anemia of chronic kidney disease Lab Results  Component Value Date   HGB 8.0 (L) 01/10/2021  Hgb remains below target but improved from yesterday.  Subq EPO ordered yesterday along with 2 unit blood transfusion ordered by primary team.  3.Secondary Hyperparathyroidism:  Lab Results  Component Value Date   PTH 227 (H) 06/08/2018   CALCIUM 8.3 (L) 01/07/2021   PHOS 9.7 (H) 01/06/2021    We will  continue to monitor bone minerals during this admission Calcium and phosphorus not at goal  4. Hypertension with chronic renal disease BP remains 1 33/74 Currently on Amlodipine and isosorbide monomitrate.    5. Closed right hip fracture Appreciate orthopedics performing a right anterior total hip arthroplasty on 01/08/21.  Patient will be discharged home with PT/OT when medically stable   LOS: 4 [place] 10/15/202212:30 PM

## 2021-01-10 NOTE — Progress Notes (Signed)
Physical Therapy Treatment Patient Details Name: Geoffrey Rangel MRN: [id number] DOB: 01/04/1961 Today's Date: 01/10/2021   History of Present Illness 60 y/o male who fell while taking the last pieces of funtinure out of his home before moving.  He suffered a R hip fx and is now s/p total hip replacement (anterior approach) 10/11.    PT Comments    Pt was long sitting in bed upon arriving. He agrees to session and is cooperative and motivated throughout. Easily able to exit bed, stand to RW and ambulate ~ 120 ft. No LOB or safety concern. Pt was sitting in BR post session attempting to have BM. RN and RN tech aware. PT recommends DC home with HHPT to follow.    Recommendations for follow up therapy are one component of a multi-disciplinary discharge planning process, led by the attending physician.  Recommendations may be updated based on patient status, additional functional criteria and insurance authorization.  Follow Up Recommendations  Home health PT;Supervision - Intermittent     Equipment Recommendations  Rolling walker with 5" wheels;3in1 (PT)       Precautions / Restrictions Precautions Precautions: Anterior Hip;Fall Precaution Booklet Issued: Yes (comment) Restrictions Weight Bearing Restrictions: Yes RLE Weight Bearing: Weight bearing as tolerated     Mobility  Bed Mobility Overal bed mobility: Modified Independent     General bed mobility comments: continues to have to use LLE to assist RLE out of bed    Transfers Overall transfer level: Modified independent Equipment used: Rolling walker (2 wheeled) Transfers: Sit to/from Stand Sit to Stand: Modified independent (Device/Increase time)         General transfer comment: no physical assistance required to stand from only minimally elevated bed height  Ambulation/Gait Ambulation/Gait assistance: Supervision Gait Distance (Feet): 120 Feet Assistive device: Rolling walker (2 wheeled)   Gait velocity:  decreased   General Gait Details: gait distances limited by needing to have BM and pain. easily able to ambulate further distances without assistance    Balance Overall balance assessment: Needs assistance Sitting-balance support: No upper extremity supported Sitting balance-Leahy Scale: Good     Standing balance support: Bilateral upper extremity supported Standing balance-Leahy Scale: Good        Cognition Arousal/Alertness: Awake/alert Behavior During Therapy: WFL for tasks assessed/performed Overall Cognitive Status: Within Functional Limits for tasks assessed      General Comments: Pt A and O x 4             Pertinent Vitals/Pain Pain Assessment: 0-10 Pain Score: 6  Pain Location: Anterior Hip Pain Descriptors / Indicators: Discomfort;Guarding;Operative site guarding Pain Intervention(s): Limited activity within patient's tolerance;Monitored during session;Premedicated before session;Repositioned     PT Goals (current goals can now be found in the care plan section) Acute Rehab PT Goals Patient Stated Goal: have less pain Progress towards PT goals: Progressing toward goals    Frequency    BID      PT Plan Current plan remains appropriate       AM-PAC PT "6 Clicks" Mobility   Outcome Measure  Help needed turning from your back to your side while in a flat bed without using bedrails?: None Help needed moving from lying on your back to sitting on the side of a flat bed without using bedrails?: None Help needed moving to and from a bed to a chair (including a wheelchair)?: A Little Help needed standing up from a chair using your arms (e.g., wheelchair or bedside chair)?: A Little Help  needed to walk in hospital room?: A Little Help needed climbing 3-5 steps with a railing? : A Little 6 Click Score: 20    End of Session Equipment Utilized During Treatment: Gait belt Activity Tolerance: Patient tolerated treatment well Patient left: Other (comment) (in  BR having BM with RN/RN tech aware) Nurse Communication: Mobility status PT Visit Diagnosis: Muscle weakness (generalized) (M62.81);Difficulty in walking, not elsewhere classified (R26.2);Pain Pain - Right/Left: Right Pain - part of body: Hip     Time: 1110-1140 PT Time Calculation (min) (ACUTE ONLY): 30 min  Charges:  $Gait Training: 8-22 mins $Therapeutic Activity: 8-22 mins                     Julaine Fusi PTA 01/10/21, 1:03 PM

## 2021-05-06 DIAGNOSIS — Z7401 Bed confinement status: Secondary | ICD-10-CM | POA: Diagnosis not present

## 2021-05-06 DIAGNOSIS — R279 Unspecified lack of coordination: Secondary | ICD-10-CM | POA: Diagnosis not present

## 2021-05-06 DIAGNOSIS — R5381 Other malaise: Secondary | ICD-10-CM | POA: Diagnosis not present

## 2021-05-27 DEATH — deceased

## 2022-03-25 IMAGING — XA DG HIP (WITH PELVIS) OPERATIVE*R*
4 series · 15 of 16 positions shown · non-contrast
Comparison: CT and radiograph 01/06/2021

CLINICAL DATA: Hip replacement

EXAM:
OPERATIVE right HIP (WITH PELVIS IF PERFORMED) 4 VIEWS
TECHNIQUE: Fluoroscopic spot image(s) were submitted for interpretation
post-operatively.

[Series 1: ortho standard · 4 of 9 frames shown (1 of 4)]
[frame 2/9]
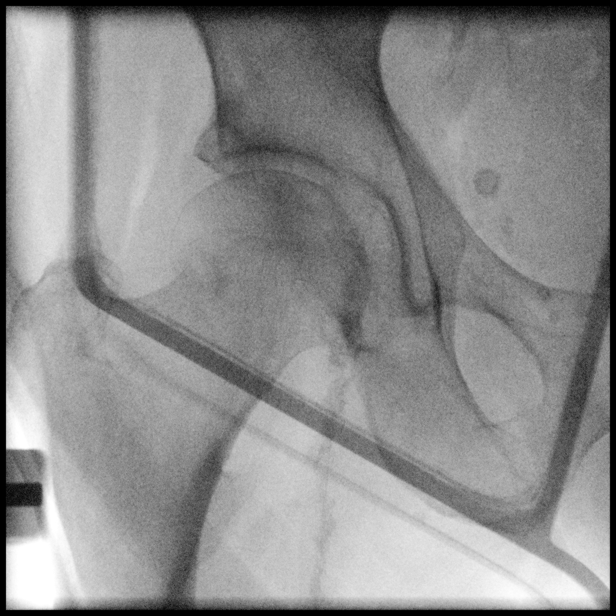
[frame 5/9]
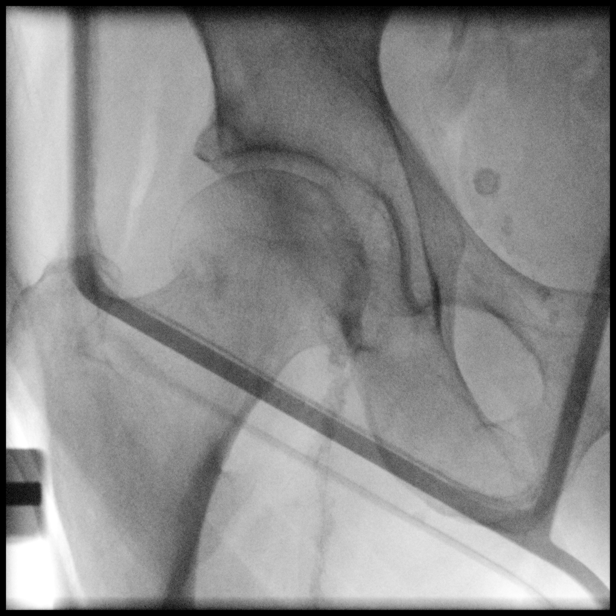
[frame 8/9]
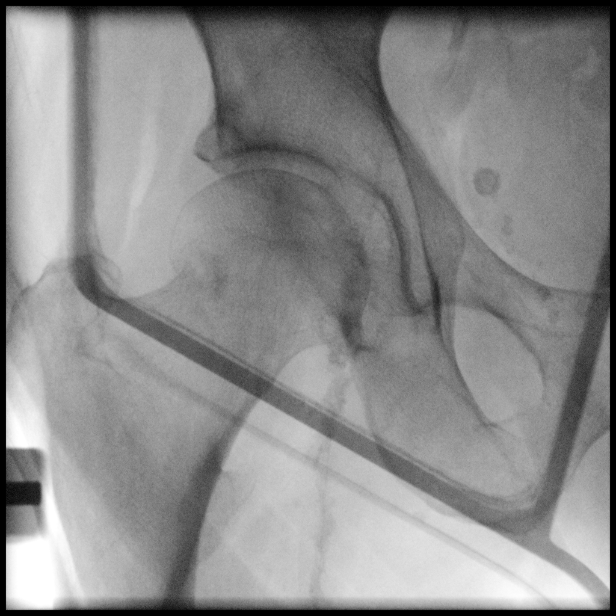
[frame 9/9]
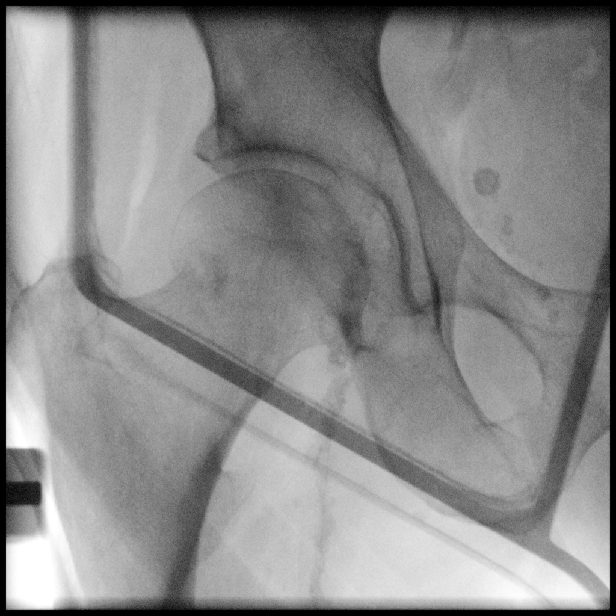

[Series 2: ortho standard · 3 of 9 frames shown (2 of 4)]
[frame 2/9]
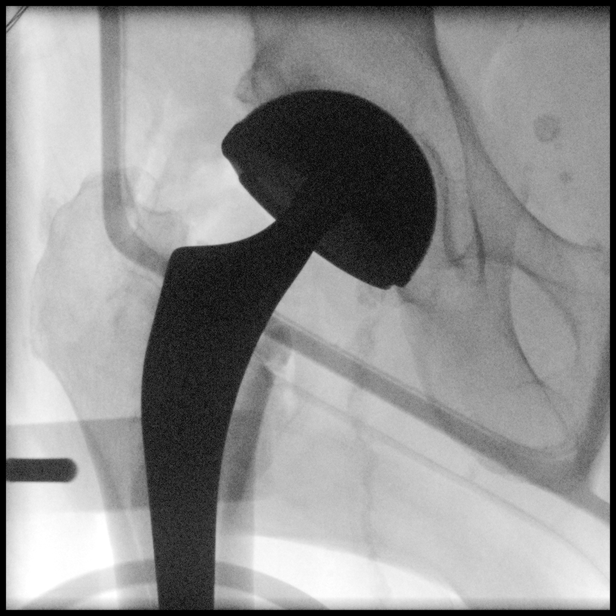
[frame 5/9]
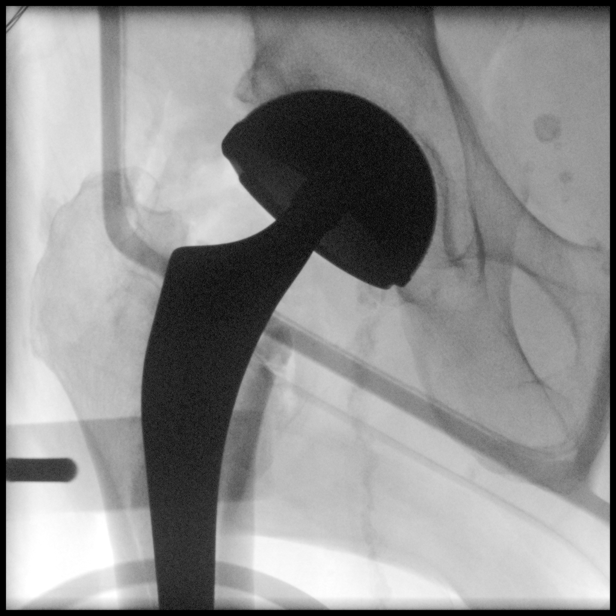
[frame 8/9]
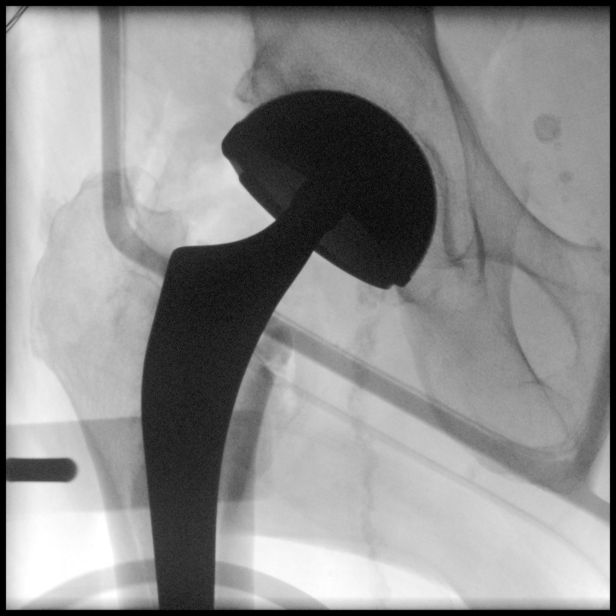

[Series 3: ortho standard · 4 of 9 frames shown (3 of 4)]
[frame 2/9]
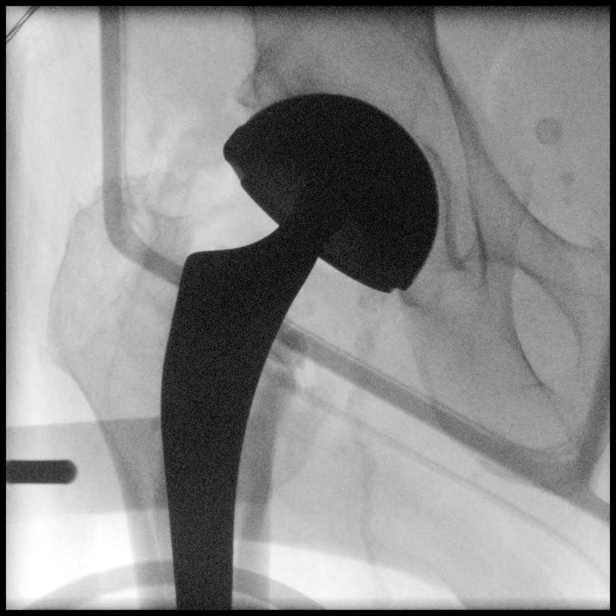
[frame 5/9]
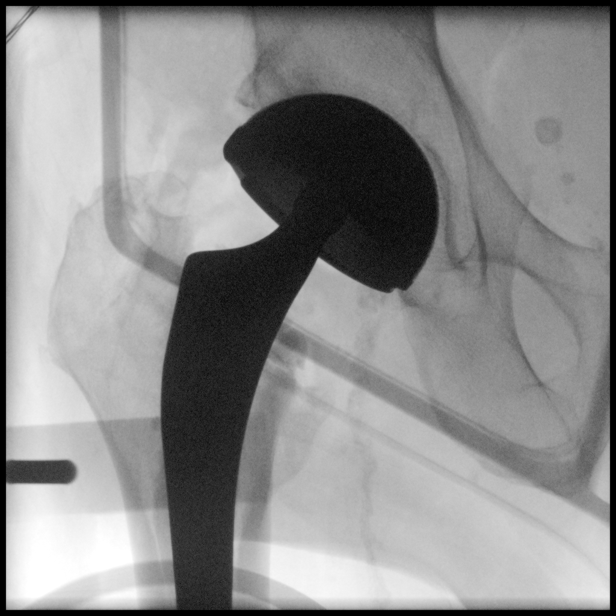
[frame 8/9]
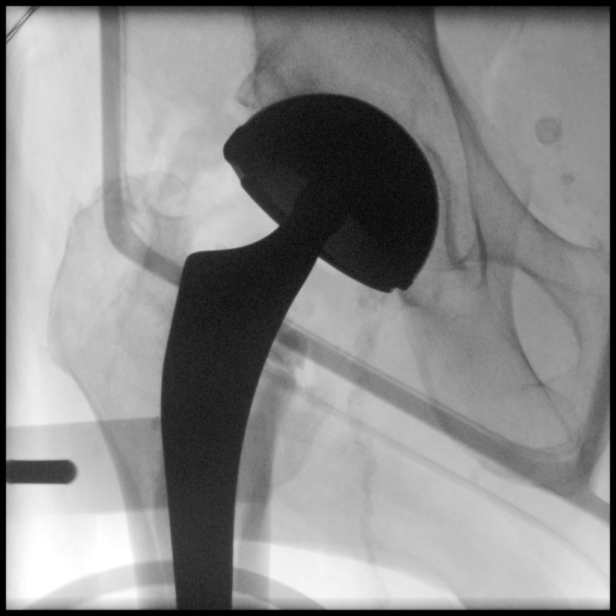
[frame 9/9]
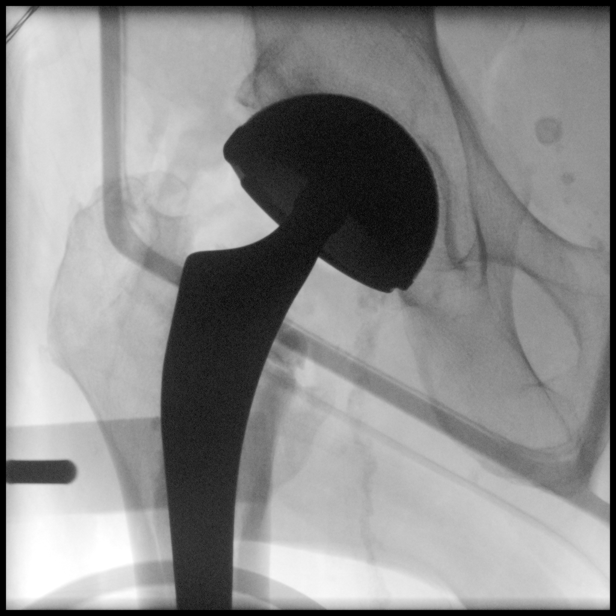

[Series 4: ortho standard · 4 of 9 frames shown (4 of 4)]
[frame 2/9]
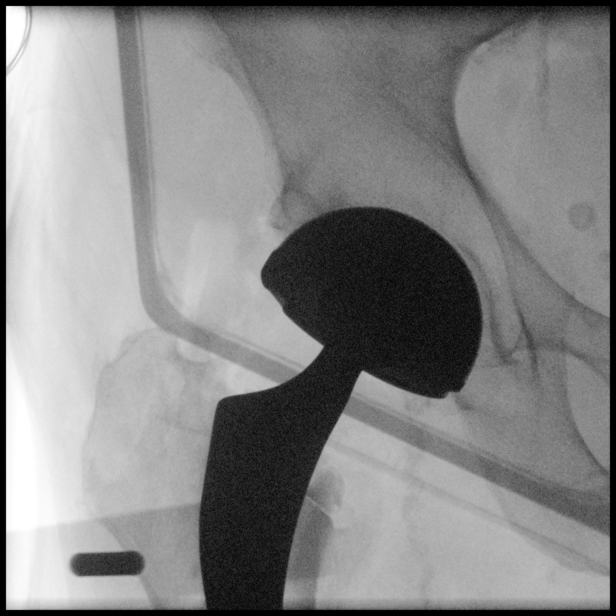
[frame 5/9]
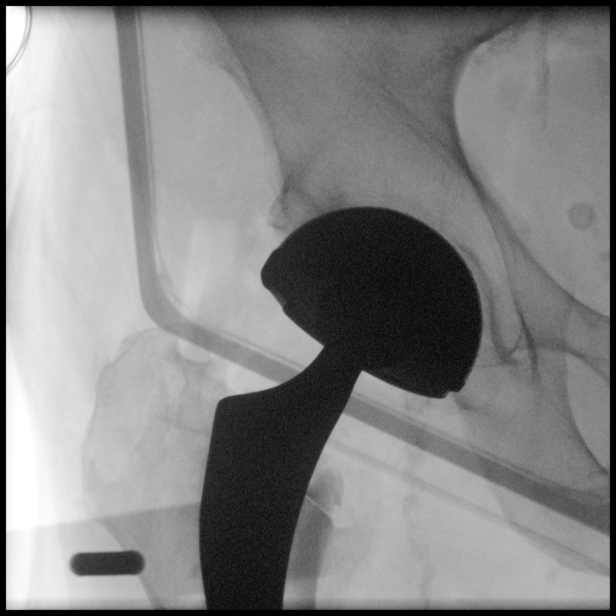
[frame 8/9]
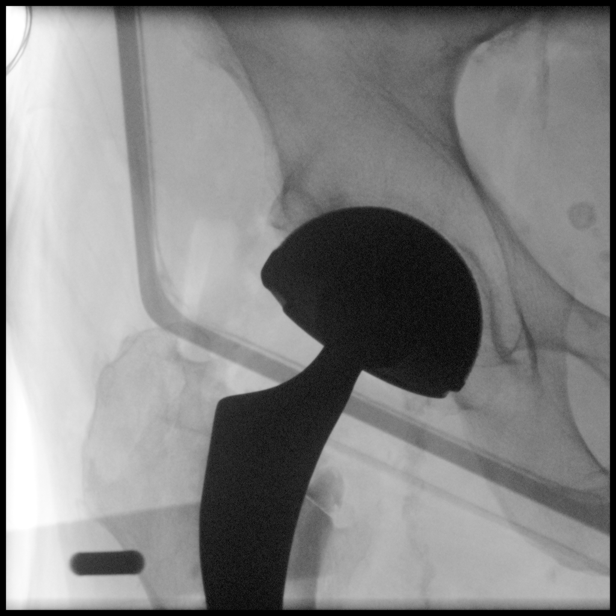
[frame 9/9]
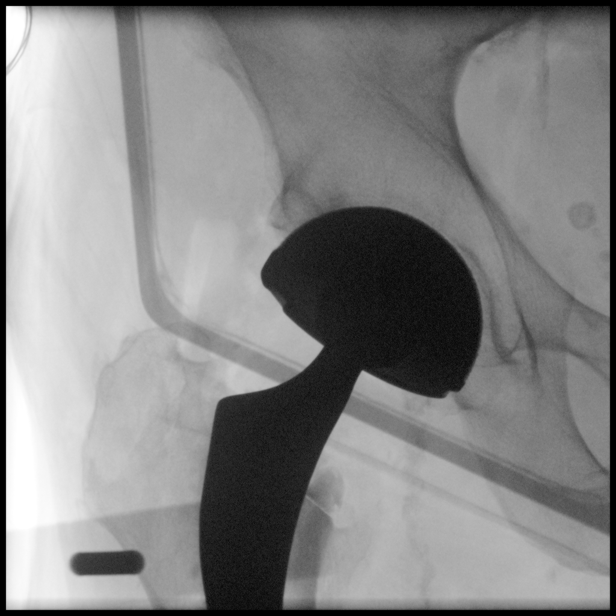

[15 of 16 positions shown; findings below may reference images not displayed]

FINDINGS: Intraoperative images during right hip arthroplasty for a femoral
neck fracture. Intact hardware without evidence of loosening or
fracture. Normal alignment.
IMPRESSION: Intraoperative images during right hip arthroplasty. No evidence of
immediate hardware complication.

## 2022-03-25 IMAGING — CT CT PELVIS W/O CM
2 of 3 series · 17 of 46 positions shown, 19 images · non-contrast
Comparison: Radiographs, same date.

CLINICAL DATA: Fell. Abnormal radiographs.

EXAM:
CT PELVIS WITHOUT CONTRAST
TECHNIQUE: Multidetector CT imaging of the pelvis was performed following the
standard protocol without intravenous contrast.

[Series 3: axial st · axial · 0.87mm/px · z∈[-1140,-864]mm · 14 of 160 slices shown, 16 images]
[im 11/160  soft-tissue]
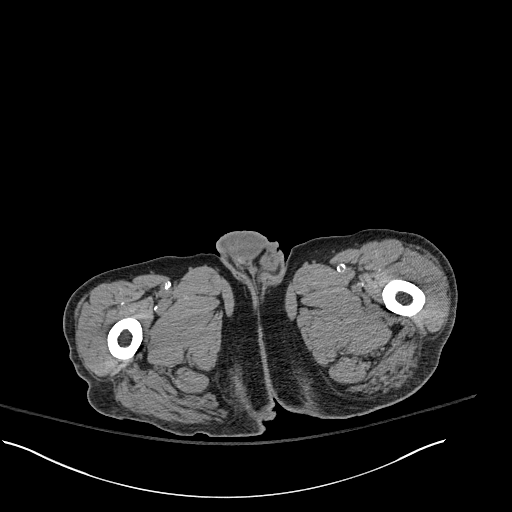
[im 11/160  bone]
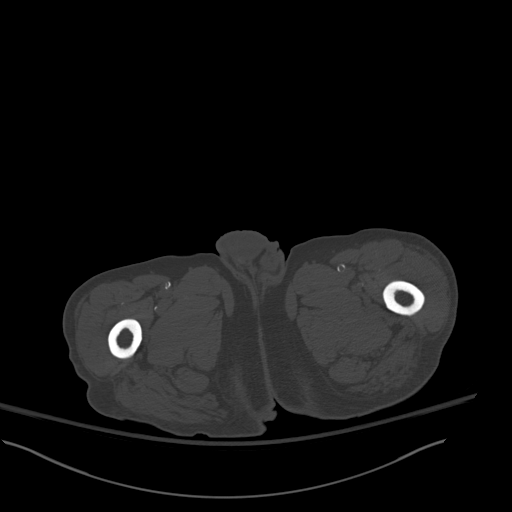
[im 21/160  soft-tissue]
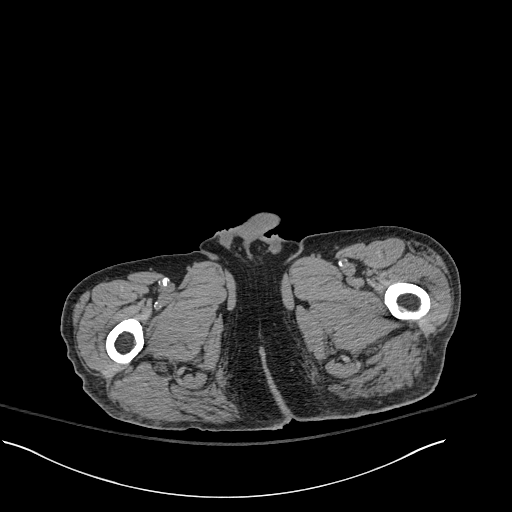
[im 31/160  soft-tissue]
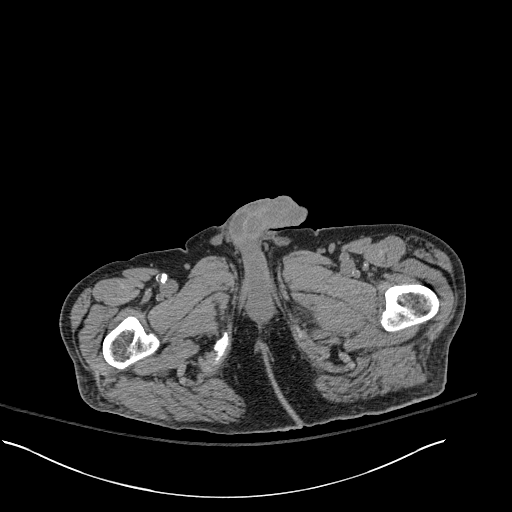
[im 42/160  soft-tissue]
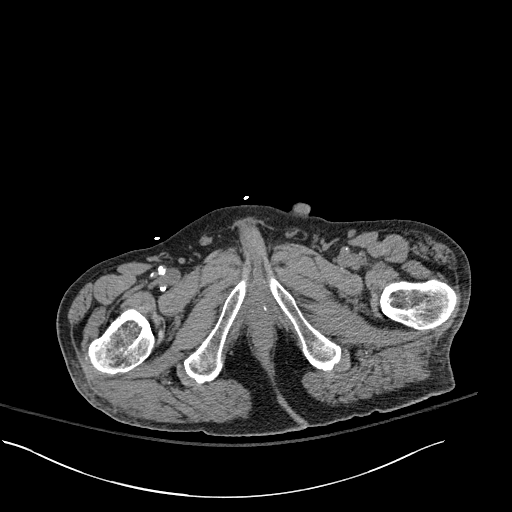
[im 52/160  soft-tissue]
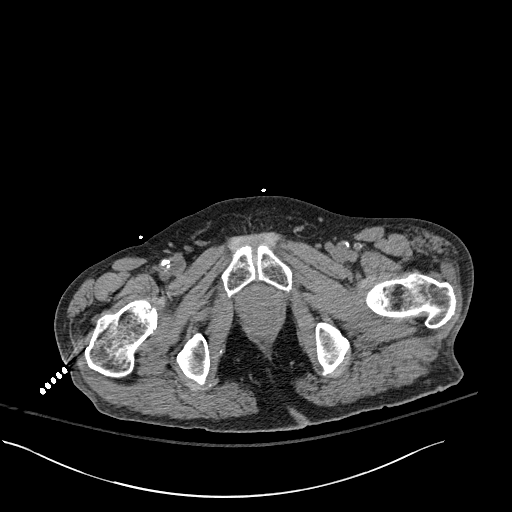
[im 62/160  soft-tissue]
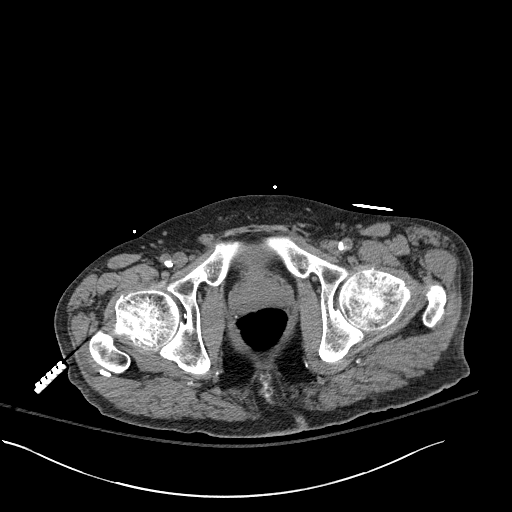
[im 72/160  soft-tissue]
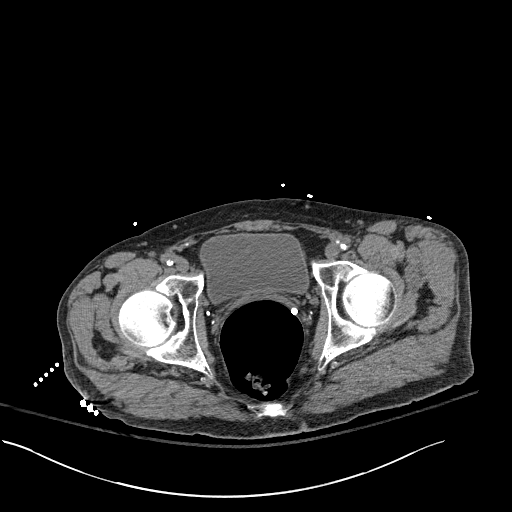
[im 88/160  soft-tissue]
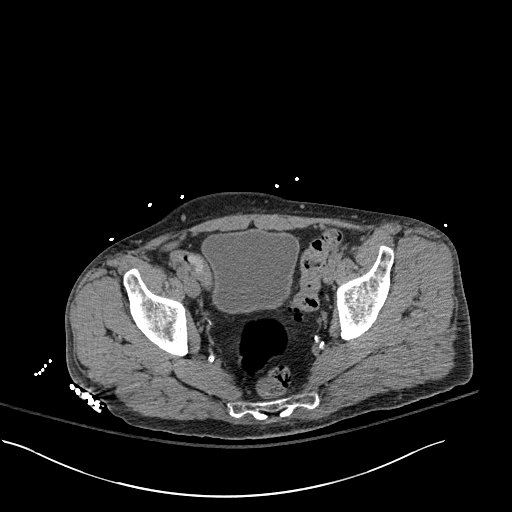
[im 98/160  soft-tissue]
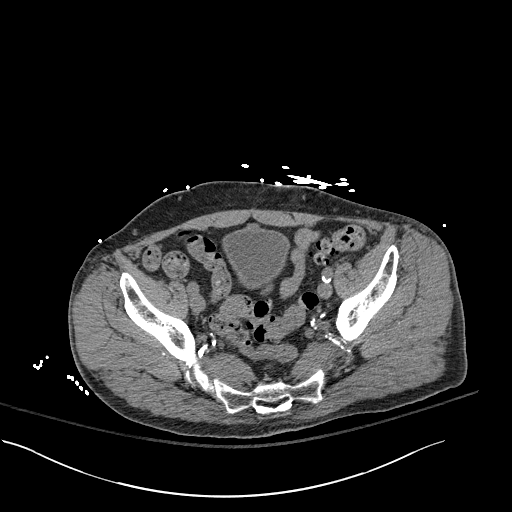
[im 98/160  bone]
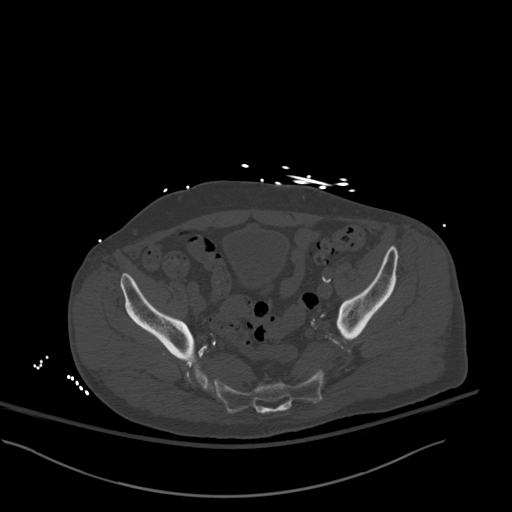
[im 108/160  soft-tissue]
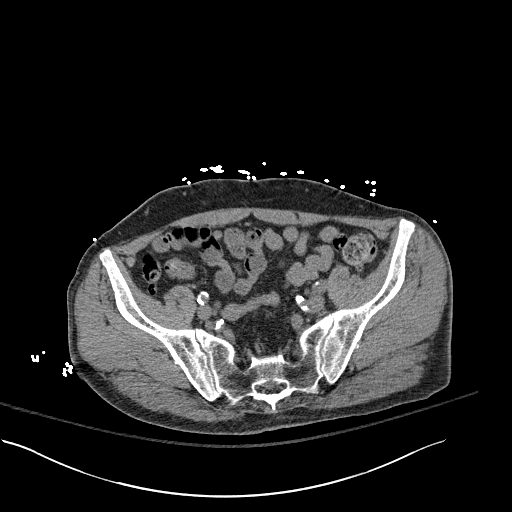
[im 118/160  soft-tissue]
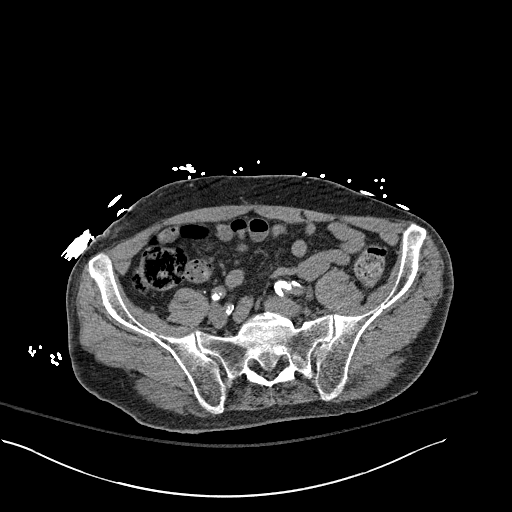
[im 129/160  soft-tissue]
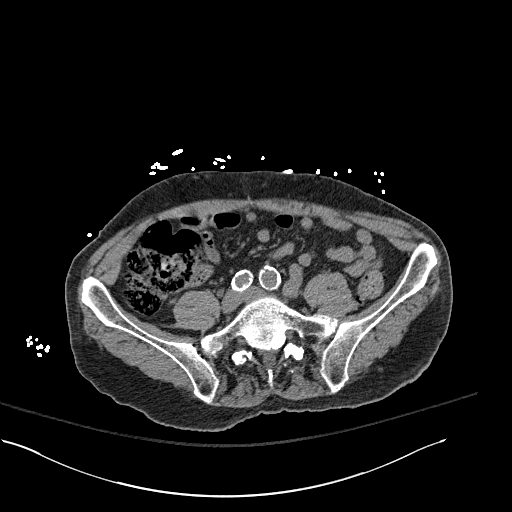
[im 139/160  soft-tissue]
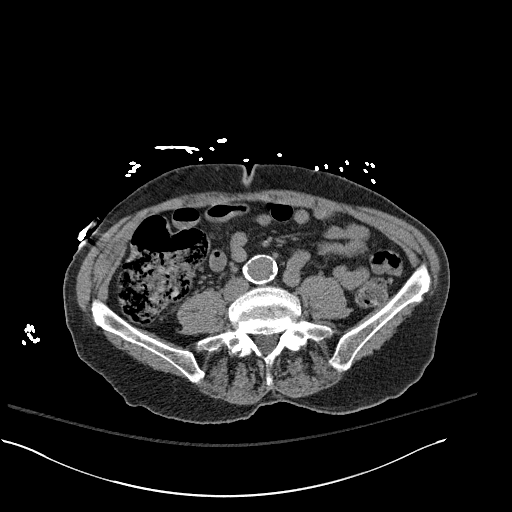
[im 149/160  soft-tissue]
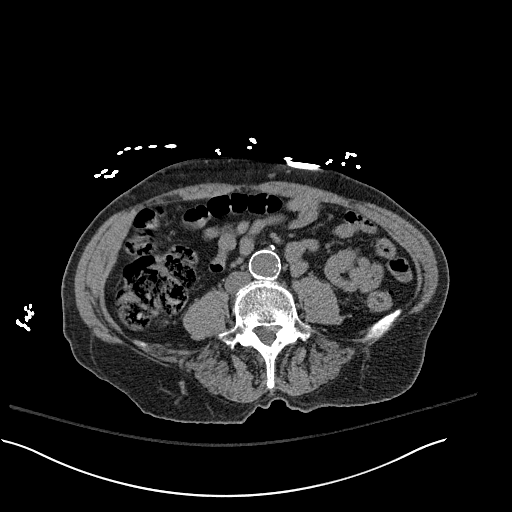

[Series 8: coronal st · coronal · 0.64mm/px · 3 of 129 slices shown]
[im 43/129  soft-tissue]
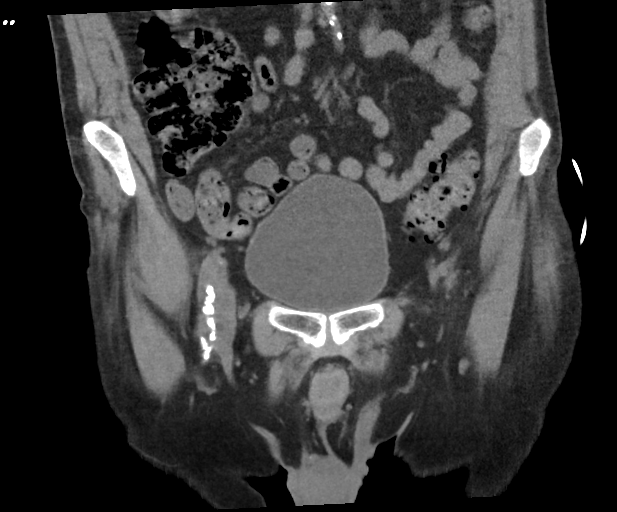
[im 57/129  soft-tissue]
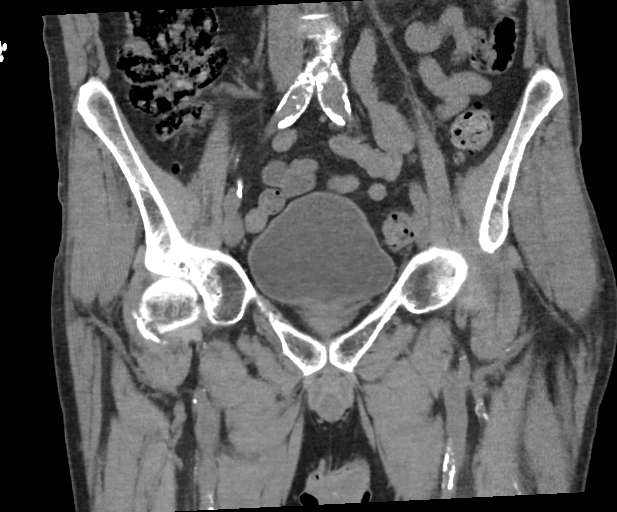
[im 72/129  soft-tissue]
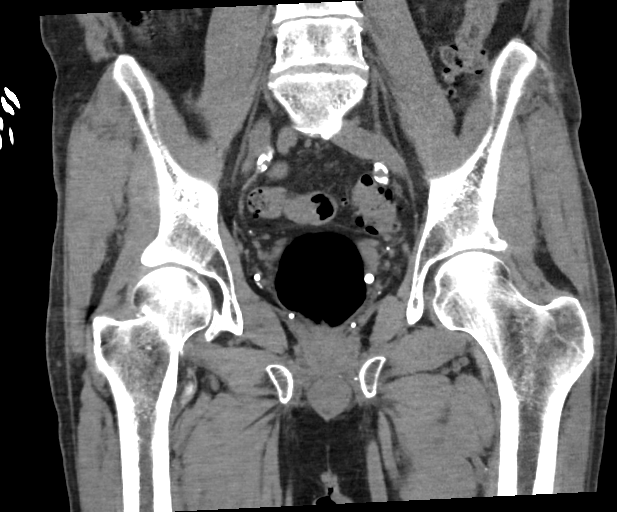

[17 of 46 positions shown; findings below may reference images not displayed]

FINDINGS: There is a mildly impacted basicervical neck fracture of the right
hip.

Both hips are normally located. Moderate degenerative changes
bilaterally. No evidence of AVN.

The pubic symphysis and SI joints are intact. No pelvic fractures or
bone lesions. Incidental partial sacralization of L5.

Grossly by CT hip and pelvic musculature are unremarkable. No
obvious muscle tear or intramuscular hematoma.

No significant intrapelvic abnormalities are identified. There are
advanced atherosclerotic calcifications involving the aorta and
iliac arteries and branch vessels.
IMPRESSION: 1. Mildly impacted basicervical neck fracture of the right hip.
2. Moderate degenerative changes bilaterally.
3. No pelvic fractures or bone lesions.
4. Advanced atherosclerotic calcifications involving the aorta and
iliac arteries and branch vessels.

Aortic Atherosclerosis ([id number]).
